# Patient Record
Sex: Female | Born: 1950 | Race: White | Hispanic: No | Smoking: Never smoker
Health system: Southern US, Community
[De-identification: ages and names within clinical notes are randomized; demographics above are authoritative.]

## PROBLEM LIST (undated history)

## (undated) DIAGNOSIS — T8859XA Other complications of anesthesia, initial encounter: Secondary | ICD-10-CM

## (undated) DIAGNOSIS — I25111 Atherosclerotic heart disease of native coronary artery with angina pectoris with documented spasm: Secondary | ICD-10-CM

## (undated) DIAGNOSIS — F419 Anxiety disorder, unspecified: Secondary | ICD-10-CM

## (undated) DIAGNOSIS — Z9861 Coronary angioplasty status: Secondary | ICD-10-CM

## (undated) DIAGNOSIS — E039 Hypothyroidism, unspecified: Secondary | ICD-10-CM

## (undated) DIAGNOSIS — I209 Angina pectoris, unspecified: Secondary | ICD-10-CM

## (undated) DIAGNOSIS — Z8669 Personal history of other diseases of the nervous system and sense organs: Secondary | ICD-10-CM

## (undated) DIAGNOSIS — M8580 Other specified disorders of bone density and structure, unspecified site: Secondary | ICD-10-CM

## (undated) DIAGNOSIS — I2510 Atherosclerotic heart disease of native coronary artery without angina pectoris: Secondary | ICD-10-CM

## (undated) DIAGNOSIS — R51 Headache: Secondary | ICD-10-CM

## (undated) DIAGNOSIS — I10 Essential (primary) hypertension: Secondary | ICD-10-CM

## (undated) DIAGNOSIS — E785 Hyperlipidemia, unspecified: Secondary | ICD-10-CM

## (undated) DIAGNOSIS — K219 Gastro-esophageal reflux disease without esophagitis: Secondary | ICD-10-CM

## (undated) DIAGNOSIS — R519 Headache, unspecified: Secondary | ICD-10-CM

## (undated) DIAGNOSIS — M1990 Unspecified osteoarthritis, unspecified site: Secondary | ICD-10-CM

## (undated) DIAGNOSIS — E119 Type 2 diabetes mellitus without complications: Secondary | ICD-10-CM

## (undated) DIAGNOSIS — T4145XA Adverse effect of unspecified anesthetic, initial encounter: Secondary | ICD-10-CM

## (undated) DIAGNOSIS — E0590 Thyrotoxicosis, unspecified without thyrotoxic crisis or storm: Secondary | ICD-10-CM

## (undated) HISTORY — DX: Other specified disorders of bone density and structure, unspecified site: M85.80

## (undated) HISTORY — PX: BACK SURGERY: SHX140

## (undated) HISTORY — PX: FRACTURE SURGERY: SHX138

## (undated) HISTORY — PX: DILATION AND CURETTAGE OF UTERUS: SHX78

## (undated) HISTORY — DX: Type 2 diabetes mellitus without complications: E11.9

## (undated) HISTORY — PX: WRIST FRACTURE SURGERY: SHX121

## (undated) HISTORY — PX: TONSILLECTOMY: SUR1361

## (undated) HISTORY — PX: WISDOM TOOTH EXTRACTION: SHX21

## (undated) HISTORY — PX: COLONOSCOPY: SHX174

## (undated) HISTORY — PX: MOUTH SURGERY: SHX715

---

## 1995-05-08 HISTORY — PX: LAPAROSCOPIC CHOLECYSTECTOMY: SUR755

## 1999-02-28 ENCOUNTER — Other Ambulatory Visit: Admission: RE | Admit: 1999-02-28 | Discharge: 1999-02-28 | Payer: Self-pay | Admitting: *Deleted

## 1999-04-07 HISTORY — PX: BREAST CYST ASPIRATION: SHX578

## 1999-06-21 ENCOUNTER — Other Ambulatory Visit: Admission: RE | Admit: 1999-06-21 | Discharge: 1999-06-21 | Payer: Self-pay | Admitting: Radiology

## 2000-02-08 ENCOUNTER — Other Ambulatory Visit: Admission: RE | Admit: 2000-02-08 | Discharge: 2000-02-08 | Payer: Self-pay | Admitting: *Deleted

## 2001-03-06 ENCOUNTER — Other Ambulatory Visit: Admission: RE | Admit: 2001-03-06 | Discharge: 2001-03-06 | Payer: Self-pay | Admitting: *Deleted

## 2001-10-27 ENCOUNTER — Encounter: Payer: Self-pay | Admitting: Family Medicine

## 2001-10-27 ENCOUNTER — Encounter: Admission: RE | Admit: 2001-10-27 | Discharge: 2001-10-27 | Payer: Self-pay | Admitting: Family Medicine

## 2002-03-10 ENCOUNTER — Other Ambulatory Visit: Admission: RE | Admit: 2002-03-10 | Discharge: 2002-03-10 | Payer: Self-pay | Admitting: *Deleted

## 2003-03-12 ENCOUNTER — Other Ambulatory Visit: Admission: RE | Admit: 2003-03-12 | Discharge: 2003-03-12 | Payer: Self-pay | Admitting: *Deleted

## 2004-03-16 ENCOUNTER — Other Ambulatory Visit: Admission: RE | Admit: 2004-03-16 | Discharge: 2004-03-16 | Payer: Self-pay | Admitting: *Deleted

## 2004-09-05 ENCOUNTER — Ambulatory Visit (HOSPITAL_COMMUNITY): Admission: RE | Admit: 2004-09-05 | Discharge: 2004-09-05 | Payer: Self-pay | Admitting: Gastroenterology

## 2005-03-17 ENCOUNTER — Other Ambulatory Visit: Admission: RE | Admit: 2005-03-17 | Discharge: 2005-03-17 | Payer: Self-pay | Admitting: Obstetrics and Gynecology

## 2006-08-03 ENCOUNTER — Other Ambulatory Visit: Admission: RE | Admit: 2006-08-03 | Discharge: 2006-08-03 | Payer: Self-pay | Admitting: Obstetrics and Gynecology

## 2007-07-03 ENCOUNTER — Emergency Department (HOSPITAL_COMMUNITY): Admission: EM | Admit: 2007-07-03 | Discharge: 2007-07-03 | Payer: Self-pay | Admitting: Emergency Medicine

## 2007-11-06 ENCOUNTER — Other Ambulatory Visit: Admission: RE | Admit: 2007-11-06 | Discharge: 2007-11-06 | Payer: Self-pay | Admitting: Obstetrics & Gynecology

## 2008-02-25 ENCOUNTER — Emergency Department (HOSPITAL_COMMUNITY): Admission: EM | Admit: 2008-02-25 | Discharge: 2008-02-25 | Payer: Self-pay | Admitting: Emergency Medicine

## 2009-12-22 ENCOUNTER — Ambulatory Visit (HOSPITAL_COMMUNITY)
Admission: RE | Admit: 2009-12-22 | Discharge: 2009-12-22 | Payer: Self-pay | Source: Home / Self Care | Admitting: Gastroenterology

## 2010-02-26 ENCOUNTER — Encounter: Payer: Self-pay | Admitting: Gastroenterology

## 2010-06-24 NOTE — Op Note (Signed)
NAMEJENIKA, [name]                 ACCOUNT NO.:  [id number]   MEDICAL RECORD NO.:  [id number]          PATIENT TYPE:  AMB   LOCATION:  ENDO                         FACILITY:  [facility]   PHYSICIAN:  Bernette Redbird, M.D.   DATE OF BIRTH:  [birth year]   DATE OF PROCEDURE:  09/05/2004  DATE OF DISCHARGE:                                 OPERATIVE REPORT   PROCEDURE:  Colonoscopy.   INDICATIONS:  Need for colon cancer screening in a 60 year old female, who  also has a history of longstanding intermittent rectal bleeding which has  been attributed to internal hemorrhoids.   FINDINGS:  Minimal diverticulosis.  Moderate internal hemorrhoids.   PROCEDURE:  The nature, purpose, and risks of the procedure had been  discussed with the patient who provided written consent.  Sedation was  fentanyl 100 mcg and Versed 9 mg IV prior to and during the course the  procedure without arrhythmias or desaturation.  The Olympus adjustable  tension pediatric video colonoscope was advanced to the terminal ileum with  just mild external abdominal compression needed to control looping in the  proximal colon.  The terminal ileum had a normal appearance, and pullback  was then performed.  The quality of prep was excellent, and it is felt that  all areas were well seen.   The patient had minimal diverticular change in the sigmoid region.  Pullout  through the anal canal demonstrated moderate internal hemorrhoids which are  the presumed source of rectal bleeding; she also had prolapsed hemorrhoids  on perianal exam at the start of the procedure.   No polyps, cancer, colitis, or vascular malformations were observed, and  retroflexion in the rectum was really unremarkable.   No biopsies were obtained.  The patient tolerated the procedure well, and  there were no apparent complications.   IMPRESSION:  1.  Screening examination in this standard risk individual, without      worrisome findings.  2.  Diverticulosis,  minimal.  3.  Moderate internal hemorrhoids which probably account for the patient's      intermittent rectal bleeding.   PLAN:  Consider sigmoidoscopic evaluation in 5 years for continued  screening.       RB/MEDQ  D:  09/05/2004  T:  09/05/2004  Job:  [id number]   cc:   Caryn Bee L. Little, M.D.  [address]  [place]  [place] [address]  Fax: [phone]

## 2011-02-06 ENCOUNTER — Other Ambulatory Visit: Payer: Self-pay | Admitting: Family Medicine

## 2011-02-06 DIAGNOSIS — M5412 Radiculopathy, cervical region: Secondary | ICD-10-CM

## 2011-02-10 ENCOUNTER — Ambulatory Visit
Admission: RE | Admit: 2011-02-10 | Discharge: 2011-02-10 | Disposition: A | Discharge status: Home / Self Care | Payer: 59 | Source: Ambulatory Visit | Attending: Family Medicine | Admitting: Family Medicine

## 2011-02-10 DIAGNOSIS — M5412 Radiculopathy, cervical region: Secondary | ICD-10-CM

## 2011-03-22 ENCOUNTER — Encounter (HOSPITAL_COMMUNITY): Payer: Self-pay | Admitting: Pharmacy Technician

## 2011-03-27 ENCOUNTER — Encounter (HOSPITAL_COMMUNITY)
Admission: RE | Admit: 2011-03-27 | Discharge: 2011-03-27 | Disposition: A | Discharge status: Home / Self Care | Payer: 59 | Source: Ambulatory Visit | Attending: Neurosurgery | Admitting: Neurosurgery

## 2011-03-27 ENCOUNTER — Encounter (HOSPITAL_COMMUNITY): Payer: Self-pay

## 2011-03-27 HISTORY — DX: Hyperlipidemia, unspecified: E78.5

## 2011-03-27 HISTORY — DX: Adverse effect of unspecified anesthetic, initial encounter: T41.45XA

## 2011-03-27 HISTORY — DX: Anxiety disorder, unspecified: F41.9

## 2011-03-27 HISTORY — DX: Other complications of anesthesia, initial encounter: T88.59XA

## 2011-03-27 HISTORY — DX: Hypothyroidism, unspecified: E03.9

## 2011-03-27 LAB — SURGICAL PCR SCREEN
MRSA, PCR: NEGATIVE
Staphylococcus aureus: NEGATIVE

## 2011-03-27 LAB — DIFFERENTIAL
Basophils Relative: 0 % (ref 0–1)
Eosinophils Absolute: 0.1 10*3/uL (ref 0.0–0.7)
Lymphs Abs: 1.7 10*3/uL (ref 0.7–4.0)
Monocytes Relative: 4 % (ref 3–12)
Neutro Abs: 3.7 10*3/uL (ref 1.7–7.7)
Neutrophils Relative %: 64 % (ref 43–77)

## 2011-03-27 LAB — BASIC METABOLIC PANEL
BUN: 16 mg/dL (ref 6–23)
Chloride: 106 mEq/L (ref 96–112)
GFR calc Af Amer: >90 mL/min (ref >90)
GFR calc non Af Amer: >90 mL/min (ref >90)
Glucose, Bld: 85 mg/dL (ref 70–99)
Potassium: 3.5 mEq/L (ref 3.5–5.1)
Sodium: 141 mEq/L (ref 135–145)

## 2011-03-27 LAB — CBC
Hemoglobin: 13.4 g/dL (ref 12.0–15.0)
Platelets: 167 10*3/uL (ref 150–400)
RBC: 4.35 MIL/uL (ref 3.87–5.11)
WBC: 5.8 10*3/uL (ref 4.0–10.5)

## 2011-03-27 LAB — TYPE AND SCREEN: Antibody Screen: NEGATIVE

## 2011-03-27 LAB — ABO/RH: ABO/RH(D): O POS

## 2011-03-27 NOTE — Progress Notes (Signed)
Dr Caryn Bee little called for recent ekg

## 2011-03-27 NOTE — Pre-Procedure Instructions (Signed)
20 Veronica [name]  03/27/2011   Your procedure is scheduled on:  04/03/10  Report to [facility] at 530 AM.  Call this number if you have problems the morning of surgery: [phone]   Remember:   Do not eat food:After Midnight.  May have clear liquids: up to 4 Hours before arrival.  Clear liquids include soda, tea, black coffee, apple or grape juice, broth.  Take these medicines the morning of surgery with A SIP OF WATER: xanax,synthroid,zoloft,diovan   Do not wear jewelry, make-up or nail polish.  Do not wear lotions, powders, or perfumes. You may wear deodorant.  Do not shave 48 hours prior to surgery.  Do not bring valuables to the hospital.  Contacts, dentures or bridgework may not be worn into surgery.  Leave suitcase in the car. After surgery it may be brought to your room.  For patients admitted to the hospital, checkout time is 11:00 AM the day of discharge.   Patients discharged the day of surgery will not be allowed to drive home.  Name and phone number of your driver: family  Special Instructions: CHG Shower Use Special Wash: 1/2 bottle night before surgery and 1/2 bottle morning of surgery.   Please read over the following fact sheets that you were given: Pain Booklet, Coughing and Deep Breathing, Blood Transfusion Information, MRSA Information and Surgical Site Infection Prevention

## 2011-04-03 MED ORDER — CEFAZOLIN SODIUM-DEXTROSE 2-3 GM-% IV SOLR
2.0000 g | INTRAVENOUS | Status: AC
  Administered 2011-04-04: 2 g via INTRAVENOUS
  Filled 2011-04-03: qty 50

## 2011-04-03 MED ORDER — CHLORHEXIDINE GLUCONATE 4 % EX LIQD
1.0000 "application " | Freq: Once | CUTANEOUS | Status: DC
  Filled 2011-04-03: qty 15

## 2011-04-03 MED ORDER — DEXAMETHASONE SODIUM PHOSPHATE 10 MG/ML IJ SOLN
10.0000 mg | Freq: Once | INTRAMUSCULAR | Status: AC
  Administered 2011-04-04: 10 mg via INTRAVENOUS
  Filled 2011-04-03: qty 1

## 2011-04-04 ENCOUNTER — Encounter (HOSPITAL_COMMUNITY): Payer: Self-pay | Admitting: Certified Registered"

## 2011-04-04 ENCOUNTER — Ambulatory Visit (HOSPITAL_COMMUNITY)
Admission: RE | Admit: 2011-04-04 | Discharge: 2011-04-05 | Disposition: A | Discharge status: Home / Self Care | Payer: 59 | Source: Ambulatory Visit | Attending: Neurosurgery | Admitting: Neurosurgery

## 2011-04-04 ENCOUNTER — Encounter (HOSPITAL_COMMUNITY)
Admission: RE | Disposition: A | Discharge status: Home / Self Care | Payer: Self-pay | Source: Ambulatory Visit | Attending: Neurosurgery

## 2011-04-04 ENCOUNTER — Ambulatory Visit (HOSPITAL_COMMUNITY): Payer: 59 | Admitting: Certified Registered"

## 2011-04-04 ENCOUNTER — Encounter (HOSPITAL_COMMUNITY): Payer: Self-pay | Admitting: Neurosurgery

## 2011-04-04 ENCOUNTER — Ambulatory Visit (HOSPITAL_COMMUNITY): Payer: 59

## 2011-04-04 ENCOUNTER — Encounter (HOSPITAL_COMMUNITY): Payer: Self-pay | Admitting: *Deleted

## 2011-04-04 DIAGNOSIS — I10 Essential (primary) hypertension: Secondary | ICD-10-CM | POA: Insufficient documentation

## 2011-04-04 DIAGNOSIS — E785 Hyperlipidemia, unspecified: Secondary | ICD-10-CM | POA: Insufficient documentation

## 2011-04-04 DIAGNOSIS — M5412 Radiculopathy, cervical region: Secondary | ICD-10-CM | POA: Diagnosis present

## 2011-04-04 DIAGNOSIS — E039 Hypothyroidism, unspecified: Secondary | ICD-10-CM | POA: Insufficient documentation

## 2011-04-04 DIAGNOSIS — F411 Generalized anxiety disorder: Secondary | ICD-10-CM | POA: Insufficient documentation

## 2011-04-04 DIAGNOSIS — Z01812 Encounter for preprocedural laboratory examination: Secondary | ICD-10-CM | POA: Insufficient documentation

## 2011-04-04 DIAGNOSIS — Z01818 Encounter for other preprocedural examination: Secondary | ICD-10-CM | POA: Insufficient documentation

## 2011-04-04 DIAGNOSIS — M47812 Spondylosis without myelopathy or radiculopathy, cervical region: Secondary | ICD-10-CM | POA: Insufficient documentation

## 2011-04-04 DIAGNOSIS — M5020 Other cervical disc displacement, unspecified cervical region: Secondary | ICD-10-CM | POA: Insufficient documentation

## 2011-04-04 HISTORY — PX: POSTERIOR CERVICAL FUSION/FORAMINOTOMY: SHX5038

## 2011-04-04 SURGERY — POSTERIOR CERVICAL FUSION/FORAMINOTOMY LEVEL 1
Anesthesia: General | Site: Neck | Laterality: Left | Wound class: Clean

## 2011-04-04 SURGERY — Surgical Case
Anesthesia: *Unknown

## 2011-04-04 MED ORDER — SODIUM CHLORIDE 0.9 % IV SOLN
250.0000 mL | INTRAVENOUS | Status: DC
  Administered 2011-04-04: 250 mL via INTRAVENOUS

## 2011-04-04 MED ORDER — POLYETHYLENE GLYCOL 3350 17 G PO PACK
17.0000 g | PACK | Freq: Every day | ORAL | Status: DC | PRN
  Filled 2011-04-04: qty 1

## 2011-04-04 MED ORDER — SENNA 8.6 MG PO TABS
1.0000 | ORAL_TABLET | Freq: Two times a day (BID) | ORAL | Status: DC
  Administered 2011-04-04: 8.6 mg via ORAL
  Filled 2011-04-04 (×4): qty 1

## 2011-04-04 MED ORDER — ROCURONIUM BROMIDE 100 MG/10ML IV SOLN
INTRAVENOUS | Status: DC | PRN
  Administered 2011-04-04: 50 mg via INTRAVENOUS

## 2011-04-04 MED ORDER — HYDROCHLOROTHIAZIDE 12.5 MG PO CAPS
12.5000 mg | ORAL_CAPSULE | Freq: Every day | ORAL | Status: DC
  Administered 2011-04-04: 12.5 mg via ORAL
  Filled 2011-04-04 (×2): qty 1

## 2011-04-04 MED ORDER — KETOROLAC TROMETHAMINE 30 MG/ML IJ SOLN: 30.0000 mg | Freq: Four times a day (QID) | INTRAMUSCULAR | Status: DC

## 2011-04-04 MED ORDER — SODIUM CHLORIDE 0.9 % IR SOLN
Status: DC | PRN
  Administered 2011-04-04: 09:00:00

## 2011-04-04 MED ORDER — DEXTROSE 5 % IV SOLN
INTRAVENOUS | Status: DC | PRN
  Administered 2011-04-04: 08:00:00 via INTRAVENOUS

## 2011-04-04 MED ORDER — IRBESARTAN 75 MG PO TABS
75.0000 mg | ORAL_TABLET | Freq: Every day | ORAL | Status: DC
  Filled 2011-04-04 (×2): qty 1

## 2011-04-04 MED ORDER — GLYCOPYRROLATE 0.2 MG/ML IJ SOLN
INTRAMUSCULAR | Status: DC | PRN
  Administered 2011-04-04: .5 mg via INTRAVENOUS

## 2011-04-04 MED ORDER — ACETAMINOPHEN 325 MG PO TABS
650.0000 mg | ORAL_TABLET | ORAL | Status: DC | PRN
  Administered 2011-04-05: 650 mg via ORAL
  Filled 2011-04-04: qty 2

## 2011-04-04 MED ORDER — ONDANSETRON HCL 4 MG/2ML IJ SOLN
INTRAMUSCULAR | Status: DC | PRN
  Administered 2011-04-04: 4 mg via INTRAVENOUS

## 2011-04-04 MED ORDER — OXYCODONE-ACETAMINOPHEN 5-325 MG PO TABS
1.0000 | ORAL_TABLET | ORAL | Status: DC | PRN
  Administered 2011-04-04 (×2): 1 via ORAL
  Filled 2011-04-04: qty 2
  Filled 2011-04-04: qty 1

## 2011-04-04 MED ORDER — SUFENTANIL CITRATE 50 MCG/ML IV SOLN
INTRAVENOUS | Status: DC | PRN
  Administered 2011-04-04: 20 ug via INTRAVENOUS
  Administered 2011-04-04: 10 ug via INTRAVENOUS

## 2011-04-04 MED ORDER — SERTRALINE HCL 25 MG PO TABS
25.0000 mg | ORAL_TABLET | Freq: Every day | ORAL | Status: DC
  Filled 2011-04-04 (×2): qty 1

## 2011-04-04 MED ORDER — LORAZEPAM 2 MG/ML IJ SOLN: 1.0000 mg | Freq: Once | INTRAMUSCULAR | Status: DC | PRN

## 2011-04-04 MED ORDER — SODIUM CHLORIDE 0.9 % IV SOLN
INTRAVENOUS | Status: AC
  Filled 2011-04-04: qty 500

## 2011-04-04 MED ORDER — ZOLPIDEM TARTRATE 10 MG PO TABS: 10.0000 mg | ORAL_TABLET | Freq: Every evening | ORAL | Status: DC | PRN

## 2011-04-04 MED ORDER — BUPIVACAINE HCL (PF) 0.25 % IJ SOLN
INTRAMUSCULAR | Status: DC | PRN
  Administered 2011-04-04: 20 mL

## 2011-04-04 MED ORDER — ADULT MULTIVITAMIN W/MINERALS CH
1.0000 | ORAL_TABLET | Freq: Every day | ORAL | Status: DC
  Filled 2011-04-04 (×2): qty 1

## 2011-04-04 MED ORDER — MIDAZOLAM HCL 5 MG/5ML IJ SOLN
INTRAMUSCULAR | Status: DC | PRN
  Administered 2011-04-04: 2 mg via INTRAVENOUS

## 2011-04-04 MED ORDER — THROMBIN 5000 UNITS EX SOLR
CUTANEOUS | Status: DC | PRN
  Administered 2011-04-04 (×2): 5000 [IU] via TOPICAL

## 2011-04-04 MED ORDER — NEOSTIGMINE METHYLSULFATE 1 MG/ML IJ SOLN
INTRAMUSCULAR | Status: DC | PRN
  Administered 2011-04-04: 3 mg via INTRAVENOUS

## 2011-04-04 MED ORDER — PROPOFOL 10 MG/ML IV EMUL
INTRAVENOUS | Status: DC | PRN
  Administered 2011-04-04: 160 mg via INTRAVENOUS
  Administered 2011-04-04: 40 mg via INTRAVENOUS

## 2011-04-04 MED ORDER — BISACODYL 10 MG RE SUPP: 10.0000 mg | Freq: Every day | RECTAL | Status: DC | PRN

## 2011-04-04 MED ORDER — HYDROMORPHONE HCL PF 1 MG/ML IJ SOLN
0.2500 mg | INTRAMUSCULAR | Status: DC | PRN
  Administered 2011-04-04 (×2): 0.25 mg via INTRAVENOUS

## 2011-04-04 MED ORDER — KETOROLAC TROMETHAMINE 30 MG/ML IJ SOLN
INTRAMUSCULAR | Status: DC | PRN
  Administered 2011-04-04: 30 mg via INTRAVENOUS

## 2011-04-04 MED ORDER — HYDROCODONE-ACETAMINOPHEN 5-325 MG PO TABS: 1.0000 | ORAL_TABLET | ORAL | Status: DC | PRN

## 2011-04-04 MED ORDER — CEFAZOLIN SODIUM 1-5 GM-% IV SOLN
1.0000 g | Freq: Three times a day (TID) | INTRAVENOUS | Status: AC
  Administered 2011-04-04 (×2): 1 g via INTRAVENOUS
  Filled 2011-04-04 (×2): qty 50

## 2011-04-04 MED ORDER — ATORVASTATIN CALCIUM 40 MG PO TABS
40.0000 mg | ORAL_TABLET | Freq: Every day | ORAL | Status: DC
  Filled 2011-04-04 (×2): qty 1

## 2011-04-04 MED ORDER — CYCLOBENZAPRINE HCL 10 MG PO TABS
10.0000 mg | ORAL_TABLET | Freq: Three times a day (TID) | ORAL | Status: DC | PRN
  Administered 2011-04-04 – 2011-04-05 (×2): 10 mg via ORAL
  Filled 2011-04-04 (×3): qty 1

## 2011-04-04 MED ORDER — LACTATED RINGERS IV SOLN
INTRAVENOUS | Status: DC | PRN
  Administered 2011-04-04 (×2): via INTRAVENOUS

## 2011-04-04 MED ORDER — FLEET ENEMA 7-19 GM/118ML RE ENEM: 1.0000 | ENEMA | Freq: Once | RECTAL | Status: AC | PRN

## 2011-04-04 MED ORDER — PROMETHAZINE HCL 25 MG/ML IJ SOLN: 6.2500 mg | INTRAMUSCULAR | Status: DC | PRN

## 2011-04-04 MED ORDER — HYDROMORPHONE HCL PF 1 MG/ML IJ SOLN: 0.5000 mg | INTRAMUSCULAR | Status: DC | PRN

## 2011-04-04 MED ORDER — ALPRAZOLAM 0.5 MG PO TABS: 0.5000 mg | ORAL_TABLET | Freq: Every evening | ORAL | Status: DC | PRN

## 2011-04-04 MED ORDER — LEVOTHYROXINE SODIUM 125 MCG PO TABS
125.0000 ug | ORAL_TABLET | Freq: Every day | ORAL | Status: DC
  Filled 2011-04-04 (×2): qty 1

## 2011-04-04 MED ORDER — SODIUM CHLORIDE 0.9 % IJ SOLN: 3.0000 mL | INTRAMUSCULAR | Status: DC | PRN

## 2011-04-04 MED ORDER — ASPIRIN EC 81 MG PO TBEC
81.0000 mg | DELAYED_RELEASE_TABLET | Freq: Every day | ORAL | Status: DC
  Administered 2011-04-04: 81 mg via ORAL
  Filled 2011-04-04 (×2): qty 1

## 2011-04-04 MED ORDER — SODIUM CHLORIDE 0.9 % IJ SOLN
3.0000 mL | Freq: Two times a day (BID) | INTRAMUSCULAR | Status: DC
  Administered 2011-04-04: 3 mL via INTRAVENOUS

## 2011-04-04 MED ORDER — ONDANSETRON HCL 4 MG/2ML IJ SOLN
4.0000 mg | INTRAMUSCULAR | Status: DC | PRN
  Administered 2011-04-04 – 2011-04-05 (×2): 4 mg via INTRAVENOUS
  Filled 2011-04-04 (×2): qty 2

## 2011-04-04 MED ORDER — HEMOSTATIC AGENTS (NO CHARGE) OPTIME
TOPICAL | Status: DC | PRN
  Administered 2011-04-04: 1 via TOPICAL

## 2011-04-04 MED ORDER — 0.9 % SODIUM CHLORIDE (POUR BTL) OPTIME
TOPICAL | Status: DC | PRN
  Administered 2011-04-04: 1000 mL

## 2011-04-04 MED ORDER — BACITRACIN 50000 UNITS IM SOLR
INTRAMUSCULAR | Status: AC
  Filled 2011-04-04: qty 1

## 2011-04-04 MED ORDER — SODIUM CHLORIDE 0.9 % IV SOLN
10.0000 mg | INTRAVENOUS | Status: DC | PRN
  Administered 2011-04-04: 80 ug via INTRAVENOUS

## 2011-04-04 MED ORDER — KETOROLAC TROMETHAMINE 30 MG/ML IJ SOLN
30.0000 mg | Freq: Four times a day (QID) | INTRAMUSCULAR | Status: DC
  Administered 2011-04-04: 30 mg via INTRAVENOUS
  Filled 2011-04-04 (×2): qty 1

## 2011-04-04 MED ORDER — FENTANYL CITRATE 0.05 MG/ML IJ SOLN: 50.0000 ug | INTRAMUSCULAR | Status: DC | PRN

## 2011-04-04 MED ORDER — MIDAZOLAM HCL 2 MG/2ML IJ SOLN: 1.0000 mg | INTRAMUSCULAR | Status: DC | PRN

## 2011-04-04 MED ORDER — ALUM & MAG HYDROXIDE-SIMETH 200-200-20 MG/5ML PO SUSP: 30.0000 mL | Freq: Four times a day (QID) | ORAL | Status: DC | PRN

## 2011-04-04 MED ORDER — HYDROMORPHONE HCL PF 1 MG/ML IJ SOLN
INTRAMUSCULAR | Status: AC
  Filled 2011-04-04: qty 1

## 2011-04-04 MED ORDER — ACETAMINOPHEN 650 MG RE SUPP: 650.0000 mg | RECTAL | Status: DC | PRN

## 2011-04-04 MED ORDER — BACITRACIN ZINC 500 UNIT/GM EX OINT
TOPICAL_OINTMENT | CUTANEOUS | Status: DC | PRN
  Administered 2011-04-04: 1 via TOPICAL

## 2011-04-04 MED ORDER — PHENOL 1.4 % MT LIQD: 1.0000 | OROMUCOSAL | Status: DC | PRN

## 2011-04-04 MED ORDER — KETOROLAC TROMETHAMINE 30 MG/ML IJ SOLN
30.0000 mg | Freq: Four times a day (QID) | INTRAMUSCULAR | Status: DC
  Administered 2011-04-04 – 2011-04-05 (×3): 30 mg via INTRAVENOUS
  Filled 2011-04-04 (×6): qty 1

## 2011-04-04 MED ORDER — MENTHOL 3 MG MT LOZG
1.0000 | LOZENGE | OROMUCOSAL | Status: DC | PRN
  Filled 2011-04-04: qty 9

## 2011-04-04 SURGICAL SUPPLY — 58 items
ADH SKN CLS APL DERMABOND .7 (GAUZE/BANDAGES/DRESSINGS) ×1
ADH SKN CLS LQ APL DERMABOND (GAUZE/BANDAGES/DRESSINGS) ×1
APL SKNCLS STERI-STRIP NONHPOA (GAUZE/BANDAGES/DRESSINGS) ×1
BAG DECANTER FOR FLEXI CONT (MISCELLANEOUS) ×2 IMPLANT
BENZOIN TINCTURE PRP APPL 2/3 (GAUZE/BANDAGES/DRESSINGS) ×2 IMPLANT
BLADE SURG ROTATE 9660 (MISCELLANEOUS) IMPLANT
BRUSH SCRUB EZ PLAIN DRY (MISCELLANEOUS) ×2 IMPLANT
BUR MATCHSTICK NEURO 3.0 LAGG (BURR) ×2 IMPLANT
CANISTER SUCTION 2500CC (MISCELLANEOUS) ×2 IMPLANT
CLOSURE STERI STRIP 1/2 X4 (GAUZE/BANDAGES/DRESSINGS) ×1 IMPLANT
CLOTH BEACON ORANGE TIMEOUT ST (SAFETY) ×2 IMPLANT
CONT SPEC 4OZ CLIKSEAL STRL BL (MISCELLANEOUS) ×2 IMPLANT
DERMABOND ADHESIVE PROPEN (GAUZE/BANDAGES/DRESSINGS) ×1
DERMABOND ADVANCED (GAUZE/BANDAGES/DRESSINGS) ×1
DERMABOND ADVANCED .7 DNX12 (GAUZE/BANDAGES/DRESSINGS) ×1 IMPLANT
DERMABOND ADVANCED .7 DNX6 (GAUZE/BANDAGES/DRESSINGS) IMPLANT
DRAPE C-ARM 42X72 X-RAY (DRAPES) ×4 IMPLANT
DRAPE LAPAROTOMY 100X72 PEDS (DRAPES) ×2 IMPLANT
DRAPE POUCH INSTRU U-SHP 10X18 (DRAPES) ×2 IMPLANT
ELECT REM PT RETURN 9FT ADLT (ELECTROSURGICAL) ×2
ELECTRODE REM PT RTRN 9FT ADLT (ELECTROSURGICAL) ×1 IMPLANT
EVACUATOR 1/8 PVC DRAIN (DRAIN) IMPLANT
GAUZE SPONGE 4X4 12PLY STRL LF (GAUZE/BANDAGES/DRESSINGS) ×1 IMPLANT
GAUZE SPONGE 4X4 16PLY XRAY LF (GAUZE/BANDAGES/DRESSINGS) IMPLANT
GLOVE BIOGEL PI IND STRL 6.5 (GLOVE) IMPLANT
GLOVE BIOGEL PI INDICATOR 6.5 (GLOVE) ×1
GLOVE ECLIPSE 7.5 STRL STRAW (GLOVE) ×1 IMPLANT
GLOVE ECLIPSE 8.5 STRL (GLOVE) ×2 IMPLANT
GLOVE EXAM NITRILE LRG STRL (GLOVE) IMPLANT
GLOVE EXAM NITRILE MD LF STRL (GLOVE) ×1 IMPLANT
GLOVE EXAM NITRILE XL STR (GLOVE) IMPLANT
GLOVE EXAM NITRILE XS STR PU (GLOVE) IMPLANT
GLOVE OPTIFIT SS 6.5 STRL BRWN (GLOVE) ×2 IMPLANT
GOWN BRE IMP SLV AUR LG STRL (GOWN DISPOSABLE) ×1 IMPLANT
GOWN BRE IMP SLV AUR XL STRL (GOWN DISPOSABLE) ×3 IMPLANT
GOWN STRL REIN 2XL LVL4 (GOWN DISPOSABLE) IMPLANT
KIT BASIN OR (CUSTOM PROCEDURE TRAY) ×2 IMPLANT
KIT ROOM TURNOVER OR (KITS) ×2 IMPLANT
NDL SPNL 22GX3.5 QUINCKE BK (NEEDLE) ×1 IMPLANT
NEEDLE HYPO 22GX1.5 SAFETY (NEEDLE) ×2 IMPLANT
NEEDLE SPNL 22GX3.5 QUINCKE BK (NEEDLE) ×2 IMPLANT
NS IRRIG 1000ML POUR BTL (IV SOLUTION) ×2 IMPLANT
PACK LAMINECTOMY NEURO (CUSTOM PROCEDURE TRAY) ×2 IMPLANT
PAD ARMBOARD 7.5X6 YLW CONV (MISCELLANEOUS) ×6 IMPLANT
PIN MAYFIELD SKULL DISP (PIN) ×2 IMPLANT
SPONGE GAUZE 4X4 12PLY (GAUZE/BANDAGES/DRESSINGS) ×2 IMPLANT
SPONGE LAP 4X18 X RAY DECT (DISPOSABLE) IMPLANT
SPONGE SURGIFOAM ABS GEL SZ50 (HEMOSTASIS) ×2 IMPLANT
STRIP CLOSURE SKIN 1/2X4 (GAUZE/BANDAGES/DRESSINGS) ×2 IMPLANT
SUT VIC AB 0 CT1 18XCR BRD8 (SUTURE) ×1 IMPLANT
SUT VIC AB 0 CT1 8-18 (SUTURE) ×2
SUT VIC AB 2-0 CT1 18 (SUTURE) ×2 IMPLANT
SUT VIC AB 3-0 SH 8-18 (SUTURE) ×2 IMPLANT
SYR 20ML ECCENTRIC (SYRINGE) ×2 IMPLANT
TAPE CLOTH SURG 4X10 WHT LF (GAUZE/BANDAGES/DRESSINGS) ×1 IMPLANT
TOWEL OR 17X24 6PK STRL BLUE (TOWEL DISPOSABLE) ×2 IMPLANT
TOWEL OR 17X26 10 PK STRL BLUE (TOWEL DISPOSABLE) ×2 IMPLANT
WATER STERILE IRR 1000ML POUR (IV SOLUTION) ×2 IMPLANT

## 2011-04-04 NOTE — Brief Op Note (Signed)
04/04/2011  9:29 AM  PATIENT:  Veronica [name]  61 y.o. female  PRE-OPERATIVE DIAGNOSIS:  Herniated Nucleus Pulposus Cervical Six-Seven  POST-OPERATIVE DIAGNOSIS:  Herniated Nucleus Pulposus  Cervical Six-Seven  PROCEDURE:  Procedure(s) (LRB): POSTERIOR CERVICAL FUSION/FORAMINOTOMY LEVEL 1 (Left)  SURGEON:  Surgeon(s) and Role:    * Temple Pacini, MD - Primary    * Reinaldo Meeker, MD - Assisting  PHYSICIAN ASSISTANT:   ASSISTANTS: Kritzer   ANESTHESIA:   general  EBL:  Total I/O In: 50 [I.V.:50] Out: -   BLOOD ADMINISTERED:none  DRAINS: none   LOCAL MEDICATIONS USED:  MARCAINE     SPECIMEN:  No Specimen  DISPOSITION OF SPECIMEN:  N/A  COUNTS:  YES  TOURNIQUET:  * No tourniquets in log *  DICTATION: .Dragon Dictation  PLAN OF CARE: Admit for overnight observation  PATIENT DISPOSITION:  PACU - hemodynamically stable.   Delay start of Pharmacological VTE agent (>24hrs) due to surgical blood loss or risk of bleeding: yes

## 2011-04-04 NOTE — H&P (Signed)
Veronica [name] is an 61 y.o. female.   Chief Complaint: Left upper extremity pain and weakness. HPI: 61 year old female with left-sided neck pain with radiation into her left upper extremity. Patient with marked triceps weakness and loss of sensation in her second and third digit on the left hand. She has failed conservative management including anti-inflammatory medications and physical therapy. She has minimal if any right-sided symptoms. She has no bowel or bladder dysfunction. MRI scanning demonstrates advanced multilevel spondylitic change with marked spondylosis off to the right side at C3-4 C4-5 C5-6 and C6-7. The patient has a spondylitic protrusion with associated disc herniation off to the left side at C6-7. We discussed options for management. The patient wishes to proceed with a laminotomy foraminotomy and microdiscectomy at C6-7 on the left in hopes of improving her symptoms in hopes of avoiding a multilevel cervical decompression and fusion.  Past Medical History  Diagnosis Date  . Complication of anesthesia     extreme sore throat after aneathesia,   also      "woke up screamimg" after surgery  . Hypothyroidism   . Hypertension     stress test 10 yrs ago.   pcp   dr Caryn Bee little  . Anxiety   . Hyperlipemia     Past Surgical History  Procedure Date  . Cholecystectomy   . Wrist fracture surgery   . Dilation and curettage of uterus   . Tonsillectomy     History reviewed. No pertinent family history. Social History:  reports that she has never smoked. She does not have any smokeless tobacco history on file. She reports that she does not drink alcohol or use illicit drugs.  Allergies:  Allergies  Allergen Reactions  . Prilosec (Omeprazole Magnesium) Other (See Comments)    Blisters and skin peels off  . Sulfa Antibiotics Other (See Comments)    Blisters and skin peels off  . Ultram (Tramadol Hcl)     Lapse of memory      Medications Prior to Admission  Medication Dose  Route Frequency Provider Last Rate Last Dose  . bacitracin 16109 UNITS injection           . ceFAZolin (ANCEF) IVPB 2 g/50 mL premix  2 g Intravenous 60 min Pre-Op Temple Pacini, MD      . chlorhexidine (HIBICLENS) 4 % liquid 1 application  1 application Topical Once Temple Pacini, MD      . dexamethasone (DECADRON) injection 10 mg  10 mg Intravenous Once Temple Pacini, MD      . fentaNYL (SUBLIMAZE) injection 50-100 mcg  50-100 mcg Intravenous PRN Bedelia Person, MD      . midazolam (VERSED) injection 1-2 mg  1-2 mg Intravenous PRN Bedelia Person, MD      . sodium chloride 0.9 % infusion            Medications Prior to Admission  Medication Sig Dispense Refill  . ALPRAZolam (XANAX) 0.5 MG tablet Take 0.5 mg by mouth at bedtime as needed. For sleep      . aspirin EC 81 MG tablet Take 81 mg by mouth daily.      [name] atorvastatin (LIPITOR) 40 MG tablet Take 40 mg by mouth daily.      . fish oil-omega-3 fatty acids 1000 MG capsule Take 1 g by mouth daily.      . hydrochlorothiazide (MICROZIDE) 12.5 MG capsule Take 12.5 mg by mouth daily.      [name] levothyroxine (SYNTHROID,  LEVOTHROID) 125 MCG tablet Take 125 mcg by mouth daily.      . Multiple Vitamin (MULITIVITAMIN WITH MINERALS) TABS Take 1 tablet by mouth daily.      . sertraline (ZOLOFT) 50 MG tablet Take 25 mg by mouth daily.      . valsartan (DIOVAN) 40 MG tablet Take 40 mg by mouth daily.        No results found for this or any previous visit (from the past 48 hour(s)). No results found.  Review of Systems  Constitutional: Negative.   HENT: Negative.   Eyes: Negative.   Respiratory: Negative.   Cardiovascular: Negative.   Gastrointestinal: Negative.   Genitourinary: Negative.   Musculoskeletal: Negative.   Skin: Negative.   Neurological: Negative.   Endo/Heme/Allergies: Negative.   Psychiatric/Behavioral: Negative.     Blood pressure 120/76, pulse 67, temperature 98.2 F (36.8 C), temperature source Oral, resp. rate 20, SpO2  95.00%. Physical Exam  Constitutional: She is oriented to person, place, and time. She appears well-developed and well-nourished. No distress.  HENT:  Head: Normocephalic and atraumatic.  Right Ear: External ear normal.  Left Ear: External ear normal.  Mouth/Throat: Oropharynx is clear and moist.  Eyes: Conjunctivae and EOM are normal. Pupils are equal, round, and reactive to light.  Neck: Normal range of motion. Neck supple. No tracheal deviation present. No thyromegaly present.  Cardiovascular: Normal rate, regular rhythm, normal heart sounds and intact distal pulses.  Exam reveals no friction rub.   No murmur heard. Respiratory: Effort normal and breath sounds normal.  GI: Soft. Bowel sounds are normal. She exhibits no distension. There is no tenderness.  Musculoskeletal: Normal range of motion. She exhibits no edema and no tenderness.  Neurological: She is alert and oriented to person, place, and time. She has normal reflexes. She displays normal reflexes. No cranial nerve deficit. She exhibits normal muscle tone. Coordination normal.  Skin: Skin is warm and dry. No rash noted. She is not diaphoretic. No erythema. No pallor.  Psychiatric: She has a normal mood and affect. Her behavior is normal. Judgment and thought content normal.     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 Left C6-7 laminotomy foraminotomy and microdiscectomy. Risks and benefits of explained. Patient wishes to proceed.  Veronica [name] A 04/04/2011, 7:45 AM

## 2011-04-04 NOTE — Anesthesia Postprocedure Evaluation (Signed)
  Anesthesia Post-op Note  Patient: Veronica [name]  Procedure(s) Performed: Procedure(s) (LRB): POSTERIOR CERVICAL FUSION/FORAMINOTOMY LEVEL 1 (Left)  Patient Location: PACU  Anesthesia Type: General  Level of Consciousness: awake and alert   Airway and Oxygen Therapy: Patient Spontanous Breathing  Post-op Pain: mild  Post-op Assessment: Post-op Vital signs reviewed, Patient's Cardiovascular Status Stable, Respiratory Function Stable, Patent Airway, No signs of Nausea or vomiting and Pain level controlled  Post-op Vital Signs: stable  Complications: No apparent anesthesia complications

## 2011-04-04 NOTE — Op Note (Signed)
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  Postoperative diagnosis: Same  Procedure Name: Left C6-7 laminotomy, foraminotomy, microdiscectomy.  Surgeon:Rockie Schnoor A.Bethany Cumming, M.D.  Asst. Surgeon: Gerlene Fee  Anesthesia: General  Indication: 61 year old female with a left extremity pain paresthesias and weakness consistent with a left C7 radiculopathy. Workup with spondylosis and a foraminal disc herniation on the left side at C6-7. Patient presents now for laminotomy and foraminotomy with microdiscectomy in hopes of improving her symptoms.  Operative note: After induction of anesthesia, patient positioned prone onto bolsters with her head fixed in a [place] pin headrest and her neck positioned in a mildly flexed position. Patient's posterior cervical region was prepped and draped sterilely. 10 blade used to make a linear skin incision overlying the C6-7 level. Supper off Sexton performed in the left side exposing the lamina and facet joints of C6-7. Self retaining retractor was placed x-ray was taken level was confirmed. Laminotomy and foraminotomies performed using a high-speed drill and Kerrison rongeurs to be the inferior aspect of lamina of C6 medial aspect of the C6-7 facet joint and the superior rim of the C7 lamina. Ligament flavum was elevated and resected in piecemeal fashion using Kerrison rongeurs. Underlying thecal sac and left C7 nerve root were then applied. Wide decompressive foraminotomy was then performed along course the exiting C7 nerve root. Microscope was brought into the field and used for microdissection of the epidural space. Epidermides plexus was coagulated and cut. C7 nerve root was gently mobilized cephalad. A focal disc herniation was encountered and dissected free. This is removed using micropituitary's. The disc space otherwise was spondylitic but not  particularly protruding. No further disc herniation was encountered. After ensuring adequate decompression by means of foraminotomies the wound is then irrigated out solution. Gelfoam was placed topically for hemostasis which Ascent be good. Microscope retractor system were removed. Hemostasis of the muscle was achieved using the electrocautery. Wounds and close in layers with Vicryl sutures. Steri-Strips triggers were applied. There were no apparent complications. Patient tolerated procedure well and returned to the recovery room postop.

## 2011-04-04 NOTE — Preoperative (Signed)
Beta Blockers   Reason not to administer Beta Blockers:Not Applicable 

## 2011-04-04 NOTE — Anesthesia Preprocedure Evaluation (Addendum)
Anesthesia Evaluation  Patient identified by MRN, date of birth, ID band Patient awake    Reviewed: Allergy & Precautions, H&P , NPO status , Patient's Chart, lab work & pertinent test results  Airway Mallampati: II TM Distance: <3 FB Neck ROM: Limited  Mouth opening: Limited Mouth Opening  Dental  (+) Teeth Intact   Pulmonary    Pulmonary exam normal       Cardiovascular hypertension,     Neuro/Psych Anxiety    GI/Hepatic   Endo/Other  Hypothyroidism   Renal/GU      Musculoskeletal   Abdominal (+) obese,   Peds  Hematology   Anesthesia Other Findings   Reproductive/Obstetrics                          Anesthesia Physical Anesthesia Plan  ASA: II  Anesthesia Plan: General   Post-op Pain Management:    Induction: Intravenous  Airway Management Planned: Oral ETT  Additional Equipment:   Intra-op Plan:   Post-operative Plan: Extubation in OR  Informed Consent: I have reviewed the patients History and Physical, chart, labs and discussed the procedure including the risks, benefits and alternatives for the proposed anesthesia with the patient or authorized representative who has indicated his/her understanding and acceptance.     Plan Discussed with: CRNA and Surgeon  Anesthesia Plan Comments:         Anesthesia Quick Evaluation

## 2011-04-04 NOTE — Transfer of Care (Signed)
Immediate Anesthesia Transfer of Care Note  Patient: Veronica [name]  Procedure(s) Performed: Procedure(s) (LRB): POSTERIOR CERVICAL FUSION/FORAMINOTOMY LEVEL 1 (Left)  Patient Location: PACU  Anesthesia Type: General  Level of Consciousness: awake, alert  and oriented  Airway & Oxygen Therapy: Patient Spontanous Breathing and Patient connected to nasal cannula oxygen  Post-op Assessment: Report given to PACU RN, Post -op Vital signs reviewed and stable and Patient moving all extremities  Post vital signs: Reviewed  Complications: No apparent anesthesia complications

## 2011-04-05 MED ORDER — HYDROCODONE-ACETAMINOPHEN 5-325 MG PO TABS: 1.0000 | ORAL_TABLET | ORAL | Status: AC | PRN

## 2011-04-05 MED ORDER — KETOROLAC TROMETHAMINE 10 MG PO TABS: 10.0000 mg | ORAL_TABLET | Freq: Four times a day (QID) | ORAL | Status: AC | PRN

## 2011-04-05 MED ORDER — CYCLOBENZAPRINE HCL 10 MG PO TABS: 10.0000 mg | ORAL_TABLET | Freq: Three times a day (TID) | ORAL | Status: AC | PRN

## 2011-04-05 NOTE — Discharge Summary (Signed)
Physician Discharge Summary  Patient ID: Veronica [name] MRN: [id number] DOB/AGE: 1951-01-06 61 y.o.  Admit date: 04/04/2011 Discharge date: 04/05/2011  Admission Diagnoses:  Discharge Diagnoses:  Principal Problem:  *Cervical radiculopathy   Discharged Condition: good  Hospital Course: Patient admitted where she underwent an uncomplicated left-sided C6-7 laminotomy foraminotomy and microdiscectomy. Postoperative she has done well. Her preoperative radicular pain has resolved. Her strength station are much improved. Wound healing well. No other problems during hospitalization. Ready for discharge. Consults:   Significant Diagnostic Studies:   Treatments:   Discharge Exam: Blood pressure 126/80, pulse 75, temperature 98.4 F (36.9 C), temperature source Oral, resp. rate 19, SpO2 98.00%. Awake and alert oriented and appropriate particular function is intact. Motor sensory function extremities is normal. Wound clean dry and intact. Chest and abdomen benign.  Disposition: Final discharge disposition not confirmed   Medication List  As of 04/05/2011  7:49 AM   TAKE these medications         ALPRAZolam 0.5 MG tablet   Commonly known as: XANAX   Take 0.5 mg by mouth at bedtime as needed. For sleep      aspirin EC 81 MG tablet   Take 81 mg by mouth daily.      atorvastatin 40 MG tablet   Commonly known as: LIPITOR   Take 40 mg by mouth daily.      cyclobenzaprine 10 MG tablet   Commonly known as: FLEXERIL   Take 1 tablet (10 mg total) by mouth 3 (three) times daily as needed for muscle spasms.      fish oil-omega-3 fatty acids 1000 MG capsule   Take 1 g by mouth daily.      hydrochlorothiazide 12.5 MG capsule   Commonly known as: MICROZIDE   Take 12.5 mg by mouth daily.      HYDROcodone-acetaminophen 5-325 MG per tablet   Commonly known as: NORCO   Take 1-2 tablets by mouth every 4 (four) hours as needed.      ketorolac 10 MG tablet   Commonly known as: TORADOL   Take  1 tablet (10 mg total) by mouth every 6 (six) hours as needed for pain.      levothyroxine 125 MCG tablet   Commonly known as: SYNTHROID, LEVOTHROID   Take 125 mcg by mouth daily.      mulitivitamin with minerals Tabs   Take 1 tablet by mouth daily.      sertraline 50 MG tablet   Commonly known as: ZOLOFT   Take 25 mg by mouth daily.      valsartan 40 MG tablet   Commonly known as: DIOVAN   Take 40 mg by mouth daily.           Follow-up Information    Follow up with Kyliah Deanda A, MD. Call in 1 week.   Contact information:   [address] [place] [address] [phone]          Signed: Temple Pacini 04/05/2011, 7:49 AM

## 2011-04-05 NOTE — Progress Notes (Signed)
Patient was d/c today per MD's order , assessments remained unchanged prior to discharged.

## 2011-04-06 ENCOUNTER — Encounter (HOSPITAL_COMMUNITY): Payer: Self-pay | Admitting: Neurosurgery

## 2012-05-23 ENCOUNTER — Ambulatory Visit: Payer: Self-pay | Admitting: Obstetrics & Gynecology

## 2012-05-23 DIAGNOSIS — Z01419 Encounter for gynecological examination (general) (routine) without abnormal findings: Secondary | ICD-10-CM

## 2012-06-10 ENCOUNTER — Encounter: Payer: Self-pay | Admitting: Obstetrics & Gynecology

## 2012-06-10 ENCOUNTER — Ambulatory Visit: Payer: Self-pay | Admitting: Obstetrics & Gynecology

## 2012-06-10 ENCOUNTER — Ambulatory Visit (INDEPENDENT_AMBULATORY_CARE_PROVIDER_SITE_OTHER): Payer: 59 | Admitting: Obstetrics & Gynecology

## 2012-06-10 VITALS — BP 142/78 | Ht 63.75 in | Wt 183.2 lb

## 2012-06-10 DIAGNOSIS — Z01419 Encounter for gynecological examination (general) (routine) without abnormal findings: Secondary | ICD-10-CM

## 2012-06-10 DIAGNOSIS — K649 Unspecified hemorrhoids: Secondary | ICD-10-CM

## 2012-06-10 MED ORDER — HYDROCORTISONE 2.5 % RE CREA: TOPICAL_CREAM | Freq: Two times a day (BID) | RECTAL | Status: DC

## 2012-06-10 NOTE — Progress Notes (Signed)
62 y.o. G2P2 MarriedCaucasianF here for annual exam.  Going to give notice this week for retirement.  Will retire June 1.  She is really excited about the next phase.  No vaginal bleeding.  Just had tetanus update.    Patient's last menstrual period was 02/06/2010.          Sexually active: yes  The current method of family planning is post menopausal status.    Exercising: yes  walking Smoker:  no  Health Maintenance: Pap:  04/19/11 WNL/negative HR HPV MMG:  12/08/11 normal Colonoscopy:  2006 repeat 10 years (initial recommendation was five yrs but changed to 10--Dr. Buccini) BMD:   11/29/10 normal (worst t score -1.5 in spine) TDaP:  4/14 Labs: PCP   reports that she has never smoked. She has never used smokeless tobacco. She reports that she does not drink alcohol or use illicit drugs.  Past Medical History  Diagnosis Date  . Complication of anesthesia     extreme sore throat after aneathesia,   also      "woke up screamimg" after surgery  . Hypothyroidism   . Hypertension     stress test 10 yrs ago.   pcp   dr Caryn Bee little  . Anxiety   . Hyperlipemia   . Breast cyst 3/01    left  . Osteopenia     Past Surgical History  Procedure Laterality Date  . Cholecystectomy    . Wrist fracture surgery    . Dilation and curettage of uterus    . Tonsillectomy    . Posterior cervical fusion/foraminotomy  04/04/2011    Procedure: POSTERIOR CERVICAL FUSION/FORAMINOTOMY LEVEL 1;  Surgeon: Temple Pacini, MD;  Location: [facility];  Service: Neurosurgery;  Laterality: Left;  Left Cervical Six-Seven Laminectomy, Foraminotomy, Diskectomy     Current Outpatient Prescriptions  Medication Sig Dispense Refill  . ALPRAZolam (XANAX) 0.5 MG tablet Take 0.5 mg by mouth at bedtime as needed. For sleep      . aspirin EC 81 MG tablet Take 81 mg by mouth daily.      [name] atorvastatin (LIPITOR) 40 MG tablet Take 40 mg by mouth daily.      . hydrochlorothiazide (MICROZIDE) 12.5 MG capsule Take 12.5 mg by  mouth daily.      [name] levothyroxine (SYNTHROID, LEVOTHROID) 125 MCG tablet Take 125 mcg by mouth daily.      . Multiple Vitamin (MULITIVITAMIN WITH MINERALS) TABS Take 1 tablet by mouth daily.      . sertraline (ZOLOFT) 50 MG tablet Take 25 mg by mouth daily.      . valsartan (DIOVAN) 40 MG tablet Take 40 mg by mouth daily.      . fish oil-omega-3 fatty acids 1000 MG capsule Take 1 g by mouth daily.       No current facility-administered medications for this visit.    History reviewed. No pertinent family history.  ROS:  Pertinent items are noted in HPI.  Otherwise, a comprehensive ROS was negative.  Exam:   BP 142/78  Ht 5' 3.75" (1.619 m)  Wt 183 lb 3.2 oz (83.099 kg)  BMI 31.7 kg/m2  LMP 02/06/2010  Wt +3 lbs   Height: 5' 3.75" (161.9 cm)  Ht Readings from Last 3 Encounters:  06/10/12 5' 3.75" (1.619 m)  04/05/11 5\' 4"  (1.626 m)  04/05/11 5\' 4"  (1.626 m)    General appearance: alert, cooperative and appears stated age Head: Normocephalic, without obvious abnormality, atraumatic Neck: no adenopathy,  supple, symmetrical, trachea midline and thyroid normal to inspection and palpation Lungs: clear to auscultation bilaterally Breasts: normal appearance, no masses or tenderness Heart: regular rate and rhythm Abdomen: soft, non-tender; bowel sounds normal; no masses,  no organomegaly Extremities: extremities normal, atraumatic, no cyanosis or edema Skin: Skin color, texture, turgor normal. No rashes or lesions Lymph nodes: Cervical, supraclavicular, and axillary nodes normal. No abnormal inguinal nodes palpated Neurologic: Grossly normal   Pelvic: External genitalia:  no lesions              Urethra:  normal appearing urethra with no masses, tenderness or lesions              Bartholins and Skenes: normal                 Vagina: normal appearing vagina with normal color and discharge, no lesions              Cervix: no lesions              Pap taken: no Bimanual Exam:  Uterus:   normal size, contour, position, consistency, mobility, non-tender              Adnexa: no mass, fullness, tenderness               Rectovaginal: Confirms               Anus:  normal sphincter tone, no lesions  A:  Well Woman with normal exam, PMP not on HRT Hypertension Hyperlipidemia Vaginal atrophic changes Hypothyroidism Hemorrhoids   P:   Mammogram yearly.  Pap with neg HR HPV 3/13.  No Pap today. Labs yearly with Dr. Clarene Duke. Anusol HC 2.5% BID PRN hemorrhoid flares return annually or prn  An After Visit Summary was printed and given to the patient.

## 2012-06-10 NOTE — Patient Instructions (Signed)

## 2012-11-27 ENCOUNTER — Other Ambulatory Visit: Payer: Self-pay | Admitting: Family Medicine

## 2012-11-27 DIAGNOSIS — R748 Abnormal levels of other serum enzymes: Secondary | ICD-10-CM

## 2012-11-28 ENCOUNTER — Ambulatory Visit
Admission: RE | Admit: 2012-11-28 | Discharge: 2012-11-28 | Disposition: A | Discharge status: Home / Self Care | Payer: 59 | Source: Ambulatory Visit | Attending: Family Medicine | Admitting: Family Medicine

## 2012-11-28 DIAGNOSIS — R748 Abnormal levels of other serum enzymes: Secondary | ICD-10-CM

## 2013-01-15 ENCOUNTER — Encounter: Payer: Self-pay | Admitting: Obstetrics & Gynecology

## 2013-03-30 IMAGING — CR DG CHEST 2V
2 series · 2 of 2 positions shown · non-contrast
Comparison: None.

CLINICAL DATA: Preoperative respiratory exam for cervical
laminectomy.  Hypertension.  Neck and arm pain.

CHEST - 2 VIEW

[w chest pa]
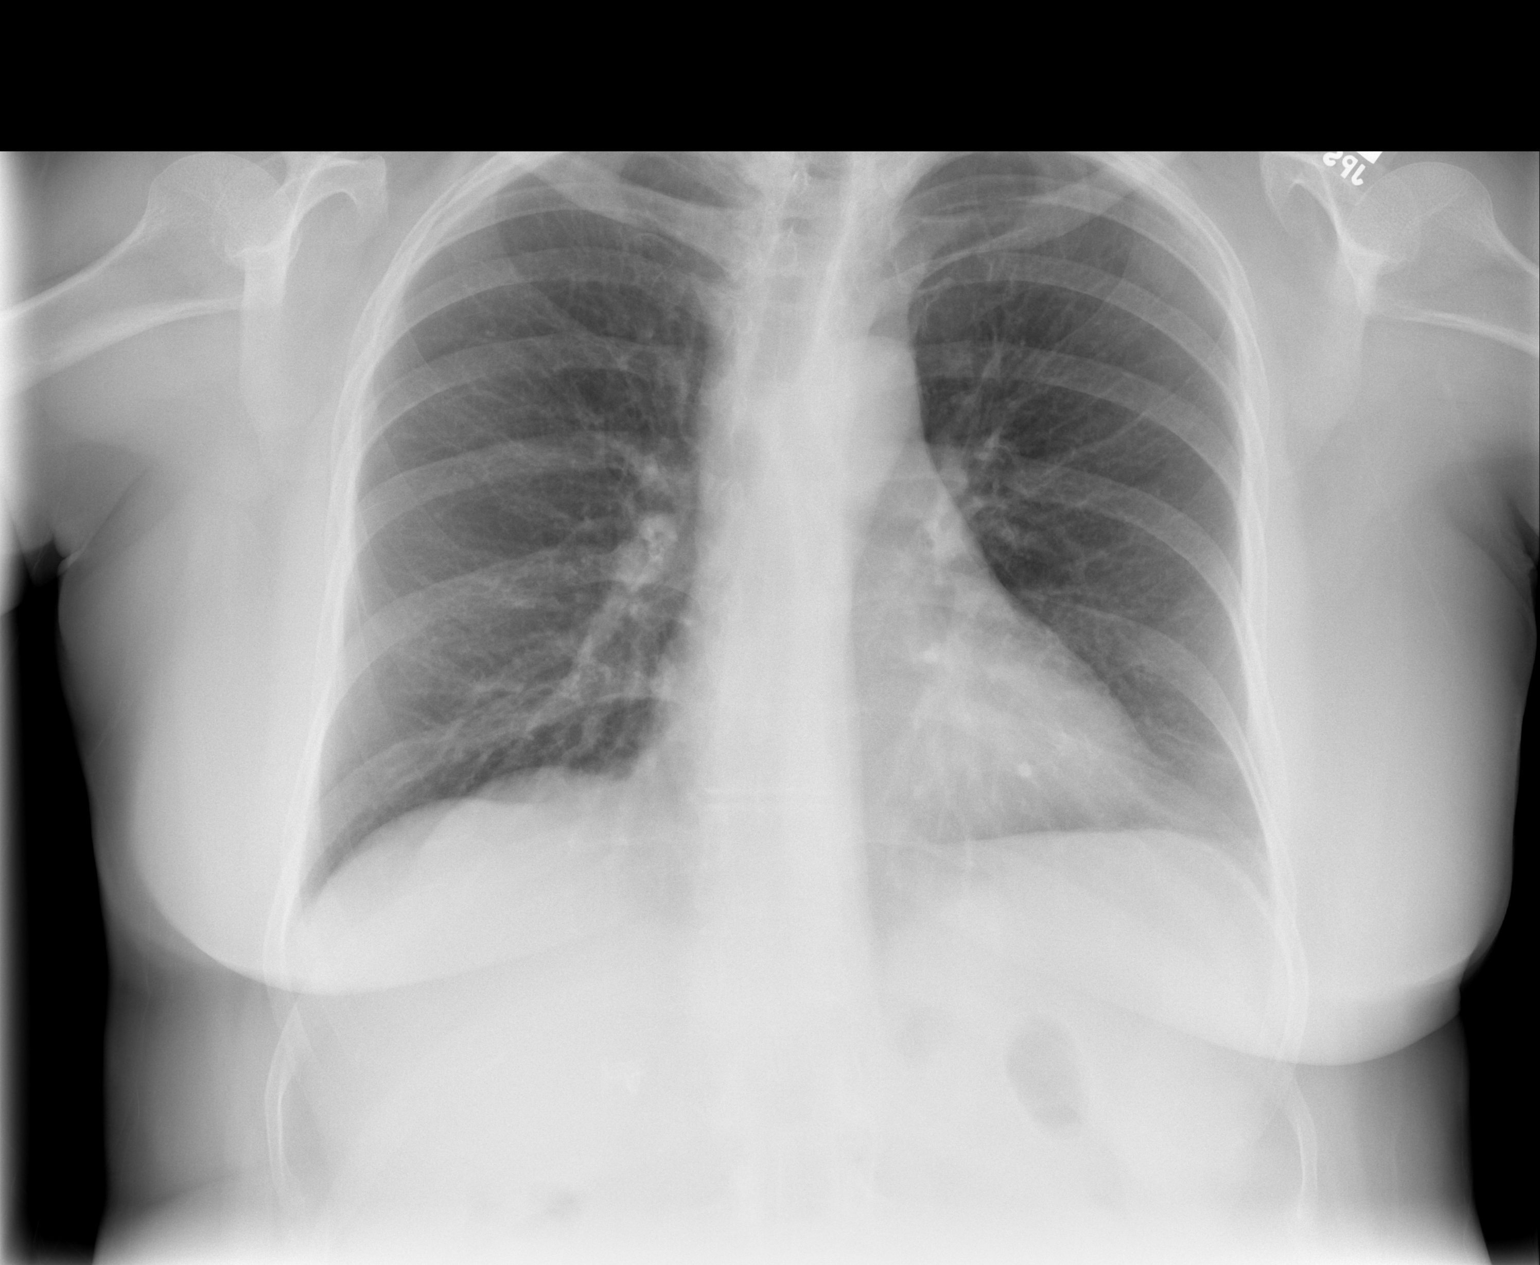

[w chest lat]
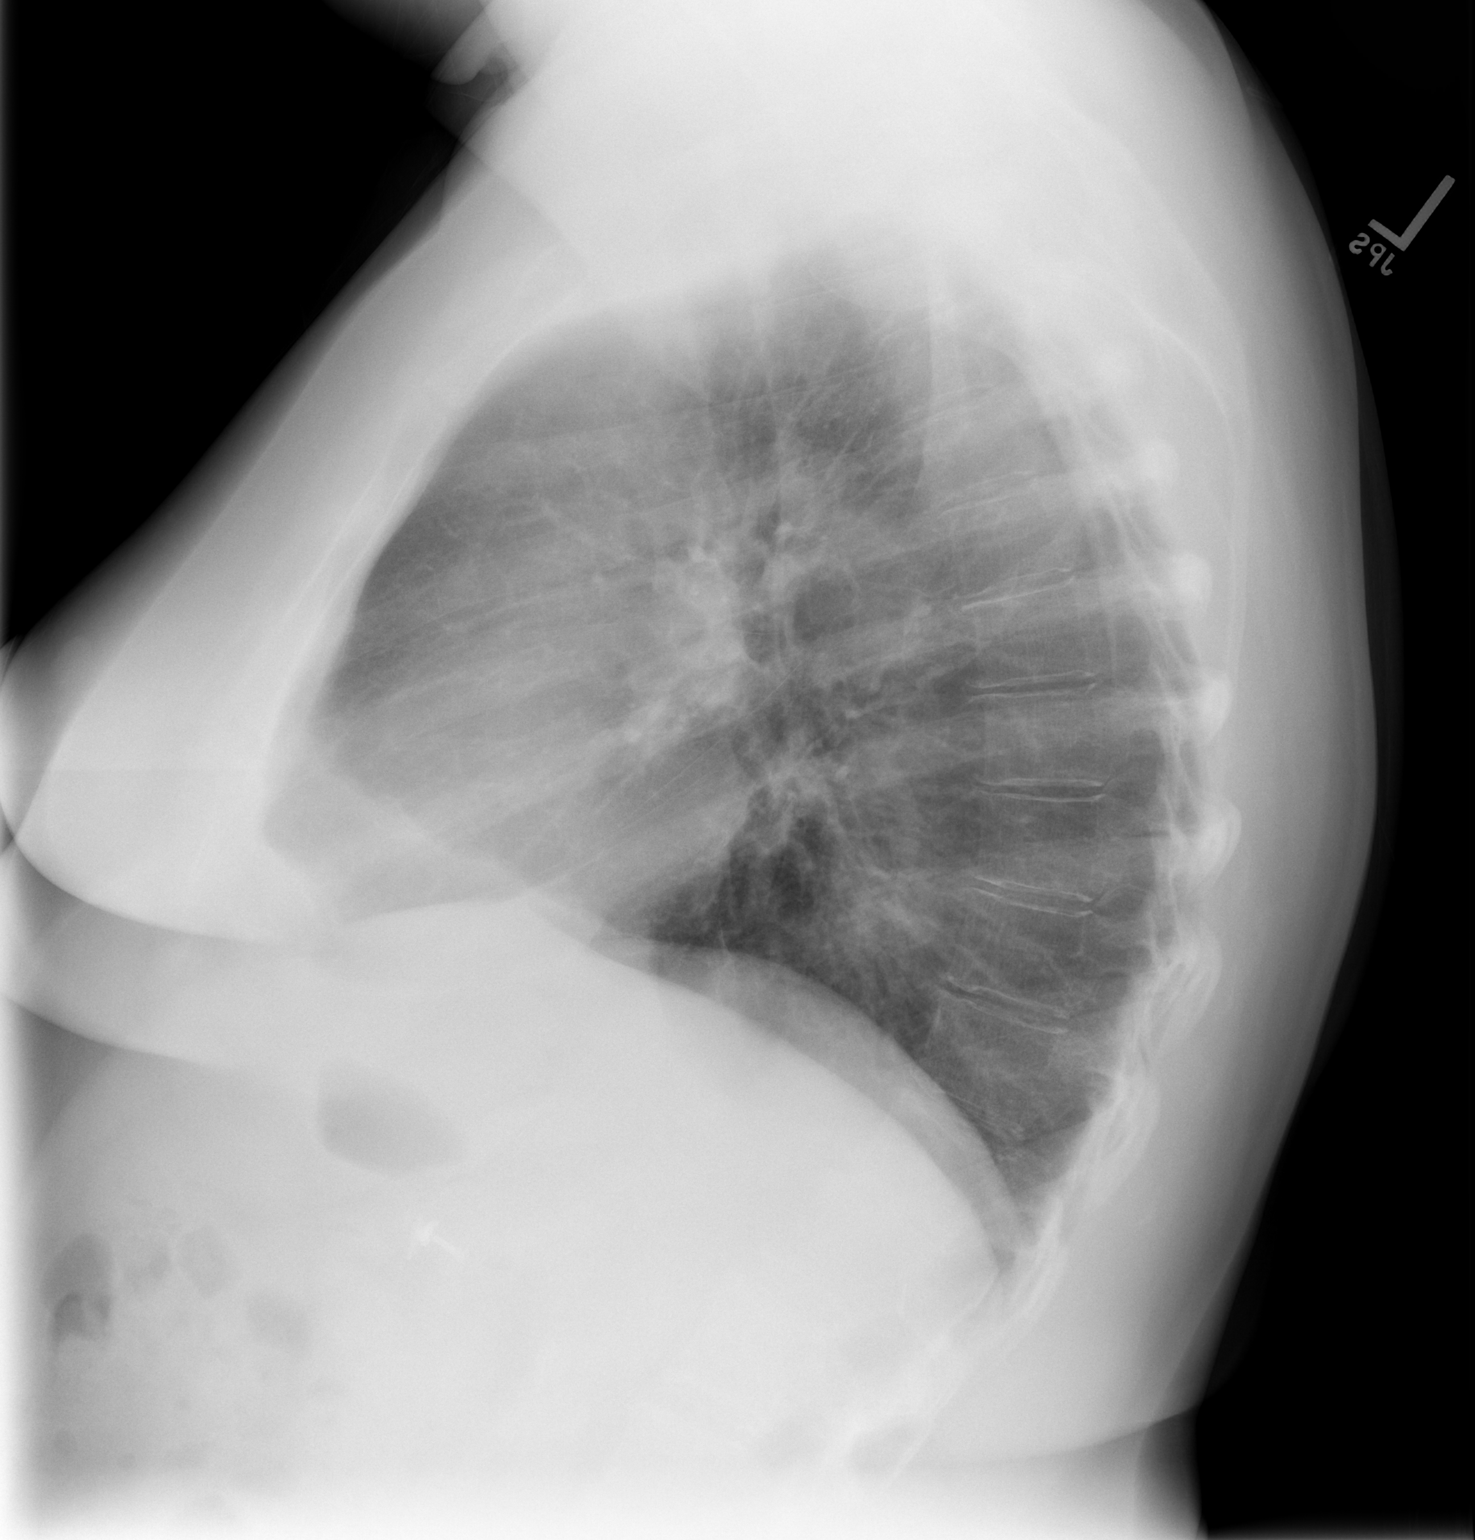

[2 of 2 positions shown; findings below may reference images not displayed]

FINDINGS: Heart size is normal.  Mediastinal shadows are normal.
The lungs are clear.  No effusions.  There is mild spinal
curvature.
IMPRESSION: No active cardiopulmonary disease.

## 2013-06-06 DIAGNOSIS — I25111 Atherosclerotic heart disease of native coronary artery with angina pectoris with documented spasm: Secondary | ICD-10-CM

## 2013-06-06 HISTORY — PX: CARDIAC CATHETERIZATION: SHX172

## 2013-06-06 HISTORY — DX: Atherosclerotic heart disease of native coronary artery with angina pectoris with documented spasm: I25.111

## 2013-07-03 ENCOUNTER — Inpatient Hospital Stay (HOSPITAL_COMMUNITY)
Admission: EM | Admit: 2013-07-03 | Discharge: 2013-07-05 | DRG: 287 | Disposition: A | Discharge status: Home / Self Care | Payer: 59 | Attending: Internal Medicine | Admitting: Internal Medicine

## 2013-07-03 ENCOUNTER — Encounter (HOSPITAL_COMMUNITY): Payer: Self-pay | Admitting: Emergency Medicine

## 2013-07-03 ENCOUNTER — Emergency Department (HOSPITAL_COMMUNITY): Payer: 59

## 2013-07-03 DIAGNOSIS — R079 Chest pain, unspecified: Secondary | ICD-10-CM | POA: Diagnosis present

## 2013-07-03 DIAGNOSIS — I200 Unstable angina: Secondary | ICD-10-CM

## 2013-07-03 DIAGNOSIS — E785 Hyperlipidemia, unspecified: Secondary | ICD-10-CM | POA: Diagnosis present

## 2013-07-03 DIAGNOSIS — Z981 Arthrodesis status: Secondary | ICD-10-CM

## 2013-07-03 DIAGNOSIS — Z7982 Long term (current) use of aspirin: Secondary | ICD-10-CM

## 2013-07-03 DIAGNOSIS — F411 Generalized anxiety disorder: Secondary | ICD-10-CM | POA: Diagnosis present

## 2013-07-03 DIAGNOSIS — I10 Essential (primary) hypertension: Secondary | ICD-10-CM | POA: Diagnosis present

## 2013-07-03 DIAGNOSIS — E119 Type 2 diabetes mellitus without complications: Secondary | ICD-10-CM | POA: Diagnosis present

## 2013-07-03 DIAGNOSIS — Z8249 Family history of ischemic heart disease and other diseases of the circulatory system: Secondary | ICD-10-CM

## 2013-07-03 DIAGNOSIS — E039 Hypothyroidism, unspecified: Secondary | ICD-10-CM | POA: Diagnosis present

## 2013-07-03 DIAGNOSIS — I2510 Atherosclerotic heart disease of native coronary artery without angina pectoris: Principal | ICD-10-CM | POA: Diagnosis present

## 2013-07-03 LAB — LIPASE, BLOOD: Lipase: 27 U/L (ref 11–59)

## 2013-07-03 LAB — CBC WITH DIFFERENTIAL/PLATELET
BASOS ABS: 0 10*3/uL (ref 0.0–0.1)
BASOS PCT: 0 % (ref 0–1)
Eosinophils Absolute: 0.1 10*3/uL (ref 0.0–0.7)
Eosinophils Relative: 2 % (ref 0–5)
HEMATOCRIT: 42.5 % (ref 36.0–46.0)
HEMOGLOBIN: 14.3 g/dL (ref 12.0–15.0)
LYMPHS PCT: 34 % (ref 12–46)
Lymphs Abs: 1.9 10*3/uL (ref 0.7–4.0)
MCH: 29.9 pg (ref 26.0–34.0)
MCHC: 33.6 g/dL (ref 30.0–36.0)
MCV: 88.9 fL (ref 78.0–100.0)
MONOS PCT: 5 % (ref 3–12)
Monocytes Absolute: 0.3 10*3/uL (ref 0.1–1.0)
NEUTROS ABS: 3.2 10*3/uL (ref 1.7–7.7)
Neutrophils Relative %: 59 % (ref 43–77)
RBC: 4.78 MIL/uL (ref 3.87–5.11)
RDW: 13.6 % (ref 11.5–15.5)
WBC: 5.5 10*3/uL (ref 4.0–10.5)

## 2013-07-03 LAB — COMPREHENSIVE METABOLIC PANEL
ALT: 23 U/L (ref 0–35)
AST: 27 U/L (ref 0–37)
Albumin: 4.3 g/dL (ref 3.5–5.2)
Alkaline Phosphatase: 152 U/L — ABNORMAL HIGH (ref 39–117)
BILIRUBIN TOTAL: 0.4 mg/dL (ref 0.3–1.2)
BUN: 25 mg/dL — ABNORMAL HIGH (ref 6–23)
CHLORIDE: 99 meq/L (ref 96–112)
CO2: 24 meq/L (ref 19–32)
CREATININE: 0.82 mg/dL (ref 0.50–1.10)
Calcium: 9.5 mg/dL (ref 8.4–10.5)
GFR calc Af Amer: 88 mL/min — ABNORMAL LOW (ref >90)
GFR, EST NON AFRICAN AMERICAN: 76 mL/min — AB (ref >90)
Glucose, Bld: 208 mg/dL — ABNORMAL HIGH (ref 70–99)
Potassium: 3.4 mEq/L — ABNORMAL LOW (ref 3.7–5.3)
Sodium: 139 mEq/L (ref 137–147)
Total Protein: 7.7 g/dL (ref 6.0–8.3)

## 2013-07-03 LAB — I-STAT TROPONIN, ED: Troponin i, poc: 0 ng/mL (ref 0.00–0.08)

## 2013-07-03 MED ORDER — HEPARIN BOLUS VIA INFUSION
4000.0000 [IU] | Freq: Once | INTRAVENOUS | Status: AC
  Administered 2013-07-04: 4000 [IU] via INTRAVENOUS
  Filled 2013-07-03: qty 4000

## 2013-07-03 MED ORDER — ASPIRIN 81 MG PO CHEW
324.0000 mg | CHEWABLE_TABLET | Freq: Once | ORAL | Status: AC
  Administered 2013-07-03: 324 mg via ORAL
  Filled 2013-07-03: qty 4

## 2013-07-03 MED ORDER — MORPHINE SULFATE 4 MG/ML IJ SOLN
4.0000 mg | Freq: Once | INTRAMUSCULAR | Status: AC
  Administered 2013-07-03: 4 mg via INTRAVENOUS
  Filled 2013-07-03: qty 1

## 2013-07-03 MED ORDER — ONDANSETRON HCL 4 MG/2ML IJ SOLN
4.0000 mg | Freq: Once | INTRAMUSCULAR | Status: AC
  Administered 2013-07-03: 4 mg via INTRAVENOUS

## 2013-07-03 MED ORDER — NITROGLYCERIN 0.4 MG SL SUBL
0.4000 mg | SUBLINGUAL_TABLET | SUBLINGUAL | Status: DC | PRN
  Administered 2013-07-03 (×2): 0.4 mg via SUBLINGUAL

## 2013-07-03 MED ORDER — ONDANSETRON HCL 4 MG/2ML IJ SOLN
INTRAMUSCULAR | Status: AC
  Administered 2013-07-03: 4 mg via INTRAVENOUS
  Filled 2013-07-03: qty 2

## 2013-07-03 MED ORDER — NITROGLYCERIN 2 % TD OINT
0.5000 [in_us] | TOPICAL_OINTMENT | Freq: Four times a day (QID) | TRANSDERMAL | Status: DC
  Administered 2013-07-03: 0.5 [in_us] via TOPICAL
  Filled 2013-07-03: qty 1

## 2013-07-03 MED ORDER — ONDANSETRON 4 MG PO TBDP
8.0000 mg | ORAL_TABLET | Freq: Once | ORAL | Status: AC
  Administered 2013-07-03: 8 mg via ORAL
  Filled 2013-07-03: qty 2

## 2013-07-03 MED ORDER — HEPARIN (PORCINE) IN NACL 100-0.45 UNIT/ML-% IJ SOLN
900.0000 [IU]/h | INTRAMUSCULAR | Status: DC
  Administered 2013-07-04: 900 [IU]/h via INTRAVENOUS
  Filled 2013-07-03 (×2): qty 250

## 2013-07-03 NOTE — ED Provider Notes (Signed)
CSN: [id number]     Arrival date & time 07/03/13  2057 History   First MD Initiated Contact with Patient 07/03/13 2308     Chief Complaint  Patient presents with  . Chest Pain  . Shortness of Breath  . Nausea  . Emesis  . Neck Pain     (Consider location/radiation/quality/duration/timing/severity/associated sxs/prior Treatment) HPI Comments: Pt with hx of HTN, HL comes in with cc of chest pain. Pt started having unprovoked chest pain while she was in her car. Pain is left sided with radiation to the jaw and left shoulder. Patient has associated n/dib. No hx of similar sx in the past. No exertional, musculoskeletal or pleuritic component to the chest pain. + hx of CAD in the fam.  Patient is a 63 y.o. female presenting with chest pain, shortness of breath, vomiting, and neck pain. The history is provided by the patient.  Chest Pain Associated symptoms: shortness of breath and vomiting   Associated symptoms: no abdominal pain, no headache and no nausea   Shortness of Breath Associated symptoms: chest pain, neck pain and vomiting   Associated symptoms: no abdominal pain and no headaches   Emesis Associated symptoms: no abdominal pain and no headaches   Neck Pain Associated symptoms: chest pain   Associated symptoms: no headaches     Past Medical History  Diagnosis Date  . Complication of anesthesia     extreme sore throat after aneathesia,   also      "woke up screamimg" after surgery  . Hypothyroidism   . Hypertension     stress test 10 yrs ago.   pcp   dr Lennette Bihari little  . Anxiety   . Hyperlipemia   . Breast cyst 3/01    left  . Osteopenia    Past Surgical History  Procedure Laterality Date  . Laparoscopic cholecystectomy  4/97  . Wrist fracture surgery    . Dilation and curettage of uterus    . Tonsillectomy    . Posterior cervical fusion/foraminotomy  04/04/2011    Procedure: POSTERIOR CERVICAL FUSION/FORAMINOTOMY LEVEL 1;  Surgeon: Charlie Pitter, MD;  Location: [place] [facility];  Service: Neurosurgery;  Laterality: Left;  Left Cervical Six-Seven Laminectomy, Foraminotomy, Diskectomy    No family history on file. History  Substance Use Topics  . Smoking status: Never Smoker   . Smokeless tobacco: Never Used  . Alcohol Use: No   OB History   Grav Para Term Preterm Abortions TAB SAB Ect Mult Living   2 2        2      Review of Systems  Constitutional: Positive for activity change.  Respiratory: Positive for shortness of breath.   Cardiovascular: Positive for chest pain.  Gastrointestinal: Positive for vomiting. Negative for nausea and abdominal pain.  Genitourinary: Negative for dysuria.  Musculoskeletal: Positive for neck pain.  Neurological: Negative for headaches.  All other systems reviewed and are negative.     Allergies  Prilosec; Sulfa antibiotics; and Ultram  Home Medications   Prior to Admission medications   Medication Sig Start Date End Date Taking? Authorizing Provider  ALPRAZolam Duanne Moron) 0.5 MG tablet Take 0.5 mg by mouth at bedtime as needed. For sleep   Yes Historical Provider, MD  aspirin EC 81 MG tablet Take 81 mg by mouth daily.   Yes Historical Provider, MD  atorvastatin (LIPITOR) 40 MG tablet Take 40 mg by mouth daily.   Yes Historical Provider, MD  fish oil-omega-3 fatty acids 1000  MG capsule Take 1 g by mouth daily.   Yes Historical Provider, MD  hydrochlorothiazide (MICROZIDE) 12.5 MG capsule Take 12.5 mg by mouth daily.   Yes Historical Provider, MD  hydrocortisone (ANUSOL-HC) 2.5 % rectal cream Place rectally 2 (two) times daily. 06/10/12  Yes Lyman Speller, MD  levothyroxine (SYNTHROID, LEVOTHROID) 125 MCG tablet Take 125 mcg by mouth daily.   Yes Historical Provider, MD  Multiple Vitamin (MULITIVITAMIN WITH MINERALS) TABS Take 1 tablet by mouth daily.   Yes Historical Provider, MD  sertraline (ZOLOFT) 50 MG tablet Take 25 mg by mouth daily.   Yes Historical Provider, MD  valsartan (DIOVAN) 40 MG tablet Take  40 mg by mouth daily.   Yes Historical Provider, MD   BP 121/58  Pulse 76  Temp(Src) 98.6 F (37 C) (Oral)  Resp 16  Ht 5\' 4"  (1.626 m)  Wt 177 lb (80.287 kg)  BMI 30.37 kg/m2  SpO2 97%  LMP 02/06/2010 Physical Exam  Nursing note and vitals reviewed. Constitutional: She is oriented to person, place, and time. She appears well-developed and well-nourished.  HENT:  Head: Normocephalic and atraumatic.  Eyes: EOM are normal. Pupils are equal, round, and reactive to light.  Neck: Neck supple.  Cardiovascular: Normal rate, regular rhythm and normal heart sounds.   No murmur heard. Pulmonary/Chest: Effort normal. No respiratory distress.  Abdominal: Soft. She exhibits no distension. There is no tenderness. There is no rebound and no guarding.  Neurological: She is alert and oriented to person, place, and time.  Skin: Skin is warm and dry.    ED Course  Procedures (including critical care time) Labs Review Labs Reviewed  COMPREHENSIVE METABOLIC PANEL - Abnormal; Notable for the following:    Potassium 3.4 (*)    Glucose, Bld 208 (*)    BUN 25 (*)    Alkaline Phosphatase 152 (*)    GFR calc non Af Amer 76 (*)    GFR calc Af Amer 88 (*)    All other components within normal limits  CBC WITH DIFFERENTIAL  LIPASE, BLOOD  HEPARIN LEVEL (UNFRACTIONATED)  CBC  COMPREHENSIVE METABOLIC PANEL  CBC WITH DIFFERENTIAL  PRO B NATRIURETIC PEPTIDE  TSH  TROPONIN I  TROPONIN I  TROPONIN I  HEMOGLOBIN A1C  I-STAT TROPOININ, ED    Imaging Review No results found.   EKG Interpretation   Date/Time:  Thursday Jul 03 2013 23:25:31 EDT Ventricular Rate:  79 PR Interval:  170 QRS Duration: 95 QT Interval:  391 QTC Calculation: 448 R Axis:   68 Text Interpretation:  Sinus rhythm Confirmed by Haider Hornaday, MD, Thelma Comp  [phone]) on 07/03/2013 11:48:16 PM      MDM   Final diagnoses:  Unstable angina    Pt comes in with cc of chest pain.  History Highly suspicious Highly  suspicious 2    ECG  Non specific repolarisation disturbance 1   Age  31 - 65 years 1    Risk Factors  ? 3 risk factors or history of atherosclerotic disease 2    Troponin  1 so far is negative.  DDX includes: Differential diagnosis includes: ACS syndrome CHF exacerbation Valvular disorder Myocarditis Pericarditis Pericardial effusion Pneumonia Pleural effusion Pulmonary edema PE Anemia Musculoskeletal pain Dissection  However, based on hx, and exam and lack of pleuritic component or m-s component and clear lung exam - we will have to r/o ACS.  Pt has persistent jaw pain. Nitro helped with the chest pain, and it has resolved. EKG  shows q wave in the inf leads, with no ST changes  -? Old infarct Nitro paste ordered. Heparin to be started. Cards and medicine consulted.   CRITICAL CARE Performed by: Gordana Kewley   Total critical care time: 45 min  Critical care time was exclusive of separately billable procedures and treating other patients.  Critical care was necessary to treat or prevent imminent or life-threatening deterioration.  Critical care was time spent personally by me on the following activities: development of treatment plan with patient and/or surrogate as well as nursing, discussions with consultants, evaluation of patient's response to treatment, examination of patient, obtaining history from patient or surrogate, ordering and performing treatments and interventions, ordering and review of laboratory studies, ordering and review of radiographic studies, pulse oximetry and re-evaluation of patient's condition.    Varney Biles, MD 07/04/13 0004

## 2013-07-03 NOTE — ED Notes (Addendum)
Pt remains alert, NAD, calm, interactive, nauseated, intermittent wretching/ dry heaves, with improving pain after ntg. Husband at chairside in triage.

## 2013-07-03 NOTE — ED Notes (Signed)
C/o CP, radiating to L neck, shoulder & jaw. Also nv and sob. Baby ASA taken this am. Rates pain 6/10. EKG ST 114.

## 2013-07-03 NOTE — ED Notes (Signed)
Dr. Newton at bedside. 

## 2013-07-03 NOTE — Progress Notes (Signed)
ANTICOAGULATION CONSULT NOTE - Initial Consult  Pharmacy Consult for Heparin  Indication: chest pain/ACS  Allergies  Allergen Reactions  . Prilosec [Omeprazole Magnesium] Other (See Comments)    Blisters and skin peels off  . Sulfa Antibiotics Other (See Comments)    Blisters and skin peels off  . Ultram [Tramadol Hcl]     Lapse of memory      Patient Measurements: Height: 5\' 4"  (162.6 cm) Weight: 177 lb (80.287 kg) IBW/kg (Calculated) : 54.7 Heparin Dosing Weight: ~71kg   Vital Signs: Temp: 98.6 F (37 C) (05/28 2157) Temp src: Oral (05/28 2157) BP: 121/58 mmHg (05/28 2326) Pulse Rate: 76 (05/28 2326)  Labs:  Recent Labs  07/03/13 2131  HGB 14.3  HCT 42.5  PLT PLATELET CLUMPS NOTED ON SMEAR, COUNT APPEARS INCREASED  CREATININE 0.82   Estimated Creatinine Clearance: 73.8 ml/min (by C-G formula based on Cr of 0.82).  Medical History: Past Medical History  Diagnosis Date  . Complication of anesthesia     extreme sore throat after aneathesia,   also      "woke up screamimg" after surgery  . Hypothyroidism   . Hypertension     stress test 10 yrs ago.   pcp   dr Lennette Bihari little  . Anxiety   . Hyperlipemia   . Breast cyst 3/01    left  . Osteopenia     Assessment: 63 y/o F to start heparin for CP. CBC good, renal function appropriate for age, first troponin negative, other labs as above.   Goal of Therapy:  Heparin level 0.3-0.7 units/ml Monitor platelets by anticoagulation protocol: Yes   Plan:  -Heparin 4000 units BOLUS x 1 -Start heparin drip at 900 units/hr -0700 HL -Daily CBC/HL -Monitor for bleeding  Narda Bonds 07/03/2013,11:54 PM

## 2013-07-04 ENCOUNTER — Encounter (HOSPITAL_COMMUNITY)
Admission: EM | Disposition: A | Discharge status: Home / Self Care | Payer: 59 | Source: Home / Self Care | Attending: Internal Medicine

## 2013-07-04 DIAGNOSIS — E039 Hypothyroidism, unspecified: Secondary | ICD-10-CM | POA: Diagnosis present

## 2013-07-04 DIAGNOSIS — I200 Unstable angina: Secondary | ICD-10-CM

## 2013-07-04 DIAGNOSIS — I10 Essential (primary) hypertension: Secondary | ICD-10-CM | POA: Diagnosis present

## 2013-07-04 DIAGNOSIS — E785 Hyperlipidemia, unspecified: Secondary | ICD-10-CM | POA: Diagnosis present

## 2013-07-04 DIAGNOSIS — R079 Chest pain, unspecified: Secondary | ICD-10-CM | POA: Diagnosis present

## 2013-07-04 DIAGNOSIS — I2510 Atherosclerotic heart disease of native coronary artery without angina pectoris: Principal | ICD-10-CM

## 2013-07-04 DIAGNOSIS — E119 Type 2 diabetes mellitus without complications: Secondary | ICD-10-CM | POA: Diagnosis present

## 2013-07-04 HISTORY — PX: LEFT HEART CATHETERIZATION WITH CORONARY ANGIOGRAM: SHX5451

## 2013-07-04 LAB — CBC WITH DIFFERENTIAL/PLATELET
BASOS ABS: 0 10*3/uL (ref 0.0–0.1)
Basophils Relative: 1 % (ref 0–1)
Eosinophils Absolute: 0.1 10*3/uL (ref 0.0–0.7)
Eosinophils Relative: 1 % (ref 0–5)
HEMATOCRIT: 40.6 % (ref 36.0–46.0)
HEMOGLOBIN: 13.9 g/dL (ref 12.0–15.0)
LYMPHS PCT: 19 % (ref 12–46)
Lymphs Abs: 1.7 10*3/uL (ref 0.7–4.0)
MCH: 30.2 pg (ref 26.0–34.0)
MCHC: 34.2 g/dL (ref 30.0–36.0)
MCV: 88.3 fL (ref 78.0–100.0)
MONOS PCT: 5 % (ref 3–12)
Monocytes Absolute: 0.4 10*3/uL (ref 0.1–1.0)
NEUTROS ABS: 6.6 10*3/uL (ref 1.7–7.7)
Neutrophils Relative %: 74 % (ref 43–77)
Platelets: 205 10*3/uL (ref 150–400)
RBC: 4.6 MIL/uL (ref 3.87–5.11)
RDW: 13.8 % (ref 11.5–15.5)
WBC: 8.7 10*3/uL (ref 4.0–10.5)

## 2013-07-04 LAB — TROPONIN I: Troponin I: <0.3 ng/mL (ref <0.30)

## 2013-07-04 LAB — COMPREHENSIVE METABOLIC PANEL
ALT: 23 U/L (ref 0–35)
AST: 27 U/L (ref 0–37)
Albumin: 4.2 g/dL (ref 3.5–5.2)
Alkaline Phosphatase: 152 U/L — ABNORMAL HIGH (ref 39–117)
BUN: 24 mg/dL — AB (ref 6–23)
CALCIUM: 9.9 mg/dL (ref 8.4–10.5)
CO2: 26 mEq/L (ref 19–32)
Chloride: 101 mEq/L (ref 96–112)
Creatinine, Ser: 0.78 mg/dL (ref 0.50–1.10)
GFR calc non Af Amer: 88 mL/min — ABNORMAL LOW (ref >90)
Glucose, Bld: 101 mg/dL — ABNORMAL HIGH (ref 70–99)
Potassium: 3.7 mEq/L (ref 3.7–5.3)
Sodium: 142 mEq/L (ref 137–147)
TOTAL PROTEIN: 7.6 g/dL (ref 6.0–8.3)
Total Bilirubin: 0.4 mg/dL (ref 0.3–1.2)

## 2013-07-04 LAB — HEPARIN LEVEL (UNFRACTIONATED): Heparin Unfractionated: 0.69 IU/mL (ref 0.30–0.70)

## 2013-07-04 LAB — CBC
HCT: 36.1 % (ref 36.0–46.0)
Hemoglobin: 11.9 g/dL — ABNORMAL LOW (ref 12.0–15.0)
MCH: 29.8 pg (ref 26.0–34.0)
MCHC: 33 g/dL (ref 30.0–36.0)
MCV: 90.3 fL (ref 78.0–100.0)
PLATELETS: 185 10*3/uL (ref 150–400)
RBC: 4 MIL/uL (ref 3.87–5.11)
RDW: 13.8 % (ref 11.5–15.5)
WBC: 6 10*3/uL (ref 4.0–10.5)

## 2013-07-04 LAB — CREATININE, SERUM
CREATININE: 1 mg/dL (ref 0.50–1.10)
GFR, EST AFRICAN AMERICAN: 69 mL/min — AB (ref >90)
GFR, EST NON AFRICAN AMERICAN: 60 mL/min — AB (ref >90)

## 2013-07-04 LAB — HEMOGLOBIN A1C
Hgb A1c MFr Bld: 5.7 % — ABNORMAL HIGH (ref <5.7)
Mean Plasma Glucose: 117 mg/dL — ABNORMAL HIGH (ref <117)

## 2013-07-04 LAB — PRO B NATRIURETIC PEPTIDE: Pro B Natriuretic peptide (BNP): 25.6 pg/mL (ref 0–125)

## 2013-07-04 LAB — MRSA PCR SCREENING: MRSA by PCR: NEGATIVE

## 2013-07-04 LAB — PROTIME-INR
INR: 1.04 (ref 0.00–1.49)
Prothrombin Time: 13.4 seconds (ref 11.6–15.2)

## 2013-07-04 LAB — TSH: TSH: 1.24 u[IU]/mL (ref 0.350–4.500)

## 2013-07-04 SURGERY — LEFT HEART CATHETERIZATION WITH CORONARY ANGIOGRAM
Anesthesia: LOCAL

## 2013-07-04 MED ORDER — ASPIRIN EC 81 MG PO TBEC
81.0000 mg | DELAYED_RELEASE_TABLET | Freq: Every day | ORAL | Status: DC
  Administered 2013-07-04 – 2013-07-05 (×2): 81 mg via ORAL
  Filled 2013-07-04 (×2): qty 1

## 2013-07-04 MED ORDER — HEPARIN (PORCINE) IN NACL 2-0.9 UNIT/ML-% IJ SOLN
INTRAMUSCULAR | Status: AC
  Filled 2013-07-04: qty 1000

## 2013-07-04 MED ORDER — HEPARIN SODIUM (PORCINE) 5000 UNIT/ML IJ SOLN
5000.0000 [IU] | Freq: Three times a day (TID) | INTRAMUSCULAR | Status: DC
  Administered 2013-07-05: 06:00:00 5000 [IU] via SUBCUTANEOUS
  Filled 2013-07-04 (×5): qty 1

## 2013-07-04 MED ORDER — NITROGLYCERIN IN D5W 200-5 MCG/ML-% IV SOLN
2.0000 ug/min | INTRAVENOUS | Status: DC
  Administered 2013-07-04: 2 ug/min via INTRAVENOUS
  Filled 2013-07-04: qty 250

## 2013-07-04 MED ORDER — OMEGA-3-ACID ETHYL ESTERS 1 G PO CAPS
1.0000 g | ORAL_CAPSULE | Freq: Every day | ORAL | Status: DC
  Administered 2013-07-04: 1 g via ORAL
  Filled 2013-07-04 (×2): qty 1

## 2013-07-04 MED ORDER — LEVOTHYROXINE SODIUM 125 MCG PO TABS
125.0000 ug | ORAL_TABLET | Freq: Every day | ORAL | Status: DC
  Administered 2013-07-04 – 2013-07-05 (×2): 125 ug via ORAL
  Filled 2013-07-04 (×3): qty 1

## 2013-07-04 MED ORDER — METOPROLOL TARTRATE 12.5 MG HALF TABLET
12.5000 mg | ORAL_TABLET | Freq: Two times a day (BID) | ORAL | Status: DC
  Administered 2013-07-04 – 2013-07-05 (×3): 12.5 mg via ORAL
  Filled 2013-07-04 (×4): qty 1

## 2013-07-04 MED ORDER — OMEGA-3 FATTY ACIDS 1000 MG PO CAPS: 1.0000 g | ORAL_CAPSULE | Freq: Every day | ORAL | Status: DC

## 2013-07-04 MED ORDER — SODIUM CHLORIDE 0.9 % IV SOLN: 250.0000 mL | INTRAVENOUS | Status: DC | PRN

## 2013-07-04 MED ORDER — HEPARIN SODIUM (PORCINE) 1000 UNIT/ML IJ SOLN
INTRAMUSCULAR | Status: AC
  Filled 2013-07-04: qty 1

## 2013-07-04 MED ORDER — SODIUM CHLORIDE 0.9 % IJ SOLN: 3.0000 mL | INTRAMUSCULAR | Status: DC | PRN

## 2013-07-04 MED ORDER — ISOSORBIDE MONONITRATE ER 30 MG PO TB24
30.0000 mg | ORAL_TABLET | Freq: Every day | ORAL | Status: DC
  Administered 2013-07-04 – 2013-07-05 (×2): 30 mg via ORAL
  Filled 2013-07-04 (×3): qty 1

## 2013-07-04 MED ORDER — CLOPIDOGREL BISULFATE 75 MG PO TABS
75.0000 mg | ORAL_TABLET | Freq: Every day | ORAL | Status: DC
  Administered 2013-07-04 – 2013-07-05 (×2): 75 mg via ORAL
  Filled 2013-07-04 (×2): qty 1

## 2013-07-04 MED ORDER — ONDANSETRON HCL 4 MG/2ML IJ SOLN
4.0000 mg | Freq: Four times a day (QID) | INTRAMUSCULAR | Status: DC | PRN
  Administered 2013-07-04: 4 mg via INTRAVENOUS
  Filled 2013-07-04: qty 2

## 2013-07-04 MED ORDER — ONDANSETRON HCL 4 MG/2ML IJ SOLN
4.0000 mg | INTRAMUSCULAR | Status: DC | PRN
  Administered 2013-07-04 (×2): 4 mg via INTRAVENOUS
  Filled 2013-07-04 (×2): qty 2

## 2013-07-04 MED ORDER — FENTANYL CITRATE 0.05 MG/ML IJ SOLN
INTRAMUSCULAR | Status: AC
  Filled 2013-07-04: qty 2

## 2013-07-04 MED ORDER — MIDAZOLAM HCL 2 MG/2ML IJ SOLN
INTRAMUSCULAR | Status: AC
  Filled 2013-07-04: qty 2

## 2013-07-04 MED ORDER — SODIUM CHLORIDE 0.9 % IJ SOLN: 3.0000 mL | Freq: Two times a day (BID) | INTRAMUSCULAR | Status: DC

## 2013-07-04 MED ORDER — ALPRAZOLAM 0.5 MG PO TABS
0.5000 mg | ORAL_TABLET | Freq: Every evening | ORAL | Status: DC | PRN
  Administered 2013-07-04: 21:00:00 0.5 mg via ORAL
  Filled 2013-07-04: qty 1

## 2013-07-04 MED ORDER — NITROGLYCERIN 0.2 MG/ML ON CALL CATH LAB
INTRAVENOUS | Status: AC
  Filled 2013-07-04: qty 1

## 2013-07-04 MED ORDER — SODIUM CHLORIDE 0.9 % IV SOLN: 1.0000 mL/kg/h | INTRAVENOUS | Status: DC

## 2013-07-04 MED ORDER — LIDOCAINE HCL (PF) 1 % IJ SOLN
INTRAMUSCULAR | Status: AC
  Filled 2013-07-04: qty 30

## 2013-07-04 MED ORDER — MORPHINE SULFATE 2 MG/ML IJ SOLN
1.0000 mg | INTRAMUSCULAR | Status: DC | PRN
  Administered 2013-07-04: 2 mg via INTRAVENOUS
  Filled 2013-07-04: qty 1

## 2013-07-04 MED ORDER — ATORVASTATIN CALCIUM 40 MG PO TABS
40.0000 mg | ORAL_TABLET | Freq: Every day | ORAL | Status: DC
  Administered 2013-07-04 – 2013-07-05 (×2): 40 mg via ORAL
  Filled 2013-07-04 (×2): qty 1

## 2013-07-04 MED ORDER — SERTRALINE HCL 25 MG PO TABS
25.0000 mg | ORAL_TABLET | Freq: Every day | ORAL | Status: DC
  Administered 2013-07-04: 25 mg via ORAL
  Filled 2013-07-04 (×2): qty 1

## 2013-07-04 MED ORDER — ADULT MULTIVITAMIN W/MINERALS CH
1.0000 | ORAL_TABLET | Freq: Every day | ORAL | Status: DC
  Administered 2013-07-04: 1 via ORAL
  Filled 2013-07-04 (×2): qty 1

## 2013-07-04 NOTE — Progress Notes (Signed)
Utilization Review Completed.Neoma Laming T Dowell5/29/2015

## 2013-07-04 NOTE — Progress Notes (Signed)
TR BAND REMOVAL  LOCATION:  right radial  DEFLATED PER PROTOCOL:  yes  TIME BAND OFF / DRESSING APPLIED: 2145   SITE UPON ARRIVAL:   Level 0  SITE AFTER BAND REMOVAL:  Level 0  REVERSE ALLEN'S TEST:    positive  CIRCULATION SENSATION AND MOVEMENT:  Within Normal Limits  yes  COMMENTS:    

## 2013-07-04 NOTE — Consult Note (Signed)
CARDIOLOGY CONSULT NOTE  Patient ID: Veronica [name]  MRN: [id number]  DOB: October 03, 1950  Admit date: 07/03/2013 Date of Consult: 07/04/2013  Primary Physician: Gennette Pac, MD Primary Cardiologist: None  Reason for Consultation: Unstable Angina  History of Present Illness: Veronica [name] is a 63 y.o. female with a history of HTN, DMII, Hyperlipidemia, who presents with new onset chest pain.  Started at 8:15pm tonight while driving.  Substernal chest pressure with radiation to L arm and neck.  +N/V.  +diaphoresis.  Pain persisted until she received nitroglycerin in ED.  Not pleuritic or positional.  She has never had pain like this before.  She is generally active, walks, works out at gym.  No angina when exercising recently.  Troponin neg x1  Past Medical History  Diagnosis Date  . Complication of anesthesia     extreme sore throat after aneathesia,   also      "woke up screamimg" after surgery  . Hypothyroidism   . Hypertension     stress test 10 yrs ago.   pcp   dr Lennette Bihari little  . Anxiety   . Hyperlipemia   . Breast cyst 3/01    left  . Osteopenia     Past Surgical History  Procedure Laterality Date  . Laparoscopic cholecystectomy  4/97  . Wrist fracture surgery    . Dilation and curettage of uterus    . Tonsillectomy    . Posterior cervical fusion/foraminotomy  04/04/2011    Procedure: POSTERIOR CERVICAL FUSION/FORAMINOTOMY LEVEL 1;  Surgeon: Charlie Pitter, MD;  Location: [place] [facility];  Service: Neurosurgery;  Laterality: Left;  Left Cervical Six-Seven Laminectomy, Foraminotomy, Diskectomy      Home Meds: Prior to Admission medications   Medication Sig Start Date End Date Taking? Authorizing Provider  ALPRAZolam Duanne Moron) 0.5 MG tablet Take 0.5 mg by mouth at bedtime as needed. For sleep   Yes Historical Provider, MD  aspirin EC 81 MG tablet Take 81 mg by mouth daily.   Yes Historical Provider, MD  atorvastatin (LIPITOR) 40 MG tablet Take 40 mg by mouth daily.   Yes  Historical Provider, MD  fish oil-omega-3 fatty acids 1000 MG capsule Take 1 g by mouth daily.   Yes Historical Provider, MD  hydrochlorothiazide (MICROZIDE) 12.5 MG capsule Take 12.5 mg by mouth daily.   Yes Historical Provider, MD  hydrocortisone (ANUSOL-HC) 2.5 % rectal cream Place rectally 2 (two) times daily. 06/10/12  Yes Lyman Speller, MD  levothyroxine (SYNTHROID, LEVOTHROID) 125 MCG tablet Take 125 mcg by mouth daily.   Yes Historical Provider, MD  Multiple Vitamin (MULITIVITAMIN WITH MINERALS) TABS Take 1 tablet by mouth daily.   Yes Historical Provider, MD  sertraline (ZOLOFT) 50 MG tablet Take 25 mg by mouth daily.   Yes Historical Provider, MD  valsartan (DIOVAN) 40 MG tablet Take 40 mg by mouth daily.   Yes Historical Provider, MD    Current Medications: . aspirin EC  81 mg Oral Daily  . atorvastatin  40 mg Oral Daily  . levothyroxine  125 mcg Oral QAC breakfast  . multivitamin with minerals  1 tablet Oral Daily  . omega-3 acid ethyl esters  1 g Oral Daily  . sertraline  25 mg Oral Daily  . sodium chloride  3 mL Intravenous Q12H     Allergies:    Allergies  Allergen Reactions  . Prilosec [Omeprazole Magnesium] Other (See Comments)    Blisters and skin peels off  .  Sulfa Antibiotics Other (See Comments)    Blisters and skin peels off  . Ultram [Tramadol Hcl]     Lapse of memory      Social History:   The patient  reports that she has never smoked. She has never used smokeless tobacco. She reports that she does not drink alcohol or use illicit drugs.    Family History:   The patient's family history is not on file.   ROS:  Please see the history of present illness.   All other systems reviewed and negative.   Vital Signs: Blood pressure 119/58, pulse 68, temperature 98.4 F (36.9 C), temperature source Oral, resp. rate 9, height 5\' 4"  (1.626 m), weight 80.2 kg (176 lb 12.9 oz), last menstrual period 02/06/2010, SpO2 95.00%.   PHYSICAL EXAM: General:  Well  nourished, well developed, in no acute distress HEENT: normal Lymph: no adenopathy Neck: no JVD Endocrine:  No thryomegaly Vascular: No carotid bruits; FA pulses 2+ bilaterally without bruits Cardiac:  normal S1, S2; RRR; no murmur Lungs:  clear to auscultation bilaterally, no wheezing, rhonchi or rales Abd: soft, nontender, no hepatomegaly Ext: no edema Musculoskeletal:  No deformities, BUE and BLE strength normal and equal Skin: warm and dry Neuro:  CNs 2-12 intact, no focal abnormalities noted Psych:  Normal affect   EKG:  NSR, no evidence of ischemia  Labs:  Recent Labs  07/04/13 0016  TROPONINI <0.30   Lab Results  Component Value Date   WBC 8.7 07/04/2013   HGB 13.9 07/04/2013   HCT 40.6 07/04/2013   MCV 88.3 07/04/2013   PLT 205 07/04/2013    Recent Labs Lab 07/04/13 0016  NA 142  K 3.7  CL 101  CO2 26  BUN 24*  CREATININE 0.78  CALCIUM 9.9  PROT 7.6  BILITOT 0.4  ALKPHOS 152*  ALT 23  AST 27  GLUCOSE 101*   No results found for this basename: CHOL, HDL, LDLCALC, TRIG   No results found for this basename: DDIMER    Radiology/Studies:  Dg Chest Portable 1 View  07/04/2013   CLINICAL DATA:  LEFT side chest pressure and shortness of breath for 2 hr, history hypertension  EXAM: PORTABLE CHEST - 1 VIEW  COMPARISON:  Portable exam 2332 hr compared to 03/27/2011  FINDINGS: Upper normal heart size accentuated by hypoinflation.  Mediastinal contours and pulmonary vascularity normal.  Decreased lung volumes with bibasilar atelectasis.  No definite infiltrate, pleural effusion or pneumothorax.  Bones unremarkable.  IMPRESSION: Decreased lung volumes with bibasilar atelectasis.   Electronically Signed   By: Lavonia Dana M.D.   On: 07/04/2013 00:13    ASSESSMENT AND PLAN:  1. Unstable Angina - Her story is consistent with unstable angina.  Several risk factors. - CP free now, vitals are at goal. - Agree with heparin, aspirin - Keep her NPO for possible cath vs.  Stress test today.  Cleotis Lema 07/04/2013 2:43 AM

## 2013-07-04 NOTE — CV Procedure (Addendum)
    Left Heart Catheterization with Coronary Angiography  Report  Veronica [name]  63 y.o.  female 24-Jun-1950  Procedure Date: 07/04/2013 Referring Physician: Peter Martinique, M.D. Primary Cardiologist: Peter Martinique, M.D.  INDICATIONS: Prolonged chest pain compatible with angina but unassociated with elevated markers or EKG changes. Pain did resolve with supplemental nitroglycerin.  PROCEDURE: 1. Left heart catheterization; 2. Coronary angiography; 3. Left ventriculography  CONSENT:  The risks, benefits, and details of the procedure were explained in detail to the patient. Risks including death, stroke, heart attack, kidney injury, allergy, limb ischemia, bleeding and radiation injury were discussed.  The patient verbalized understanding and wanted to proceed.  Informed written consent was obtained.  PROCEDURE TECHNIQUE:  After Xylocaine anesthesia a 5 French Slender sheath was placed in the right radial artery with an angiocath and the modified Seldinger technique.  Coronary angiography was done using a 5 F JR 4 and JL 3.5 cm diagnostic catheterS.  Left ventriculography was done using the JR 4 catheter and hand injection.   Hemostasis was achieved with a wrist band   CONTRAST:  Total of 100 cc.  COMPLICATIONS:  None   HEMODYNAMICS:  Aortic pressure 134/78 mmHg; LV pressure 134/12 mmHg; LVEDP 19 mm mercury  ANGIOGRAPHIC DATA:   The left main coronary artery is widely patent but with calcified plaque noted.  The left anterior descending artery is diffusely diseased and calcified throughout the proximal to mid segment. In the midsegment after the origin of the second diagonal there is eccentric 60-75% stenosis in the LAD. The second diagonal contains ostial 50-70% stenosis.  The left circumflex artery is contains moderate diffuse atherosclerosis. 4 obtuse marginal branches arise from it. The first and second marginal branches are small. The third marginal branch contains proximal segmental  50-70% narrowing the fourth obtuse marginal contains 50-60% narrowing..  The right coronary artery is is dominant and widely patent with the distal segment containing a very focal eccentric 30-50% narrowing. The proximal PDA contains segmental 50-60% narrowing.   LEFT VENTRICULOGRAM:  Left ventricular angiogram was done in the 30 RAO projection and revealed overall normal function with EF of 60%   IMPRESSIONS:  1. Diffuse coronary atherosclerosis involving the circumflex, LAD, and distal right coronary.  2. There is moderate to moderately severe mid LAD/diagonal stenosis, and  moderate OM 3 and OM 4 disease. The proximal PDA contains 40-50% narrowing.  3. Normal left ventricular function   RECOMMENDATION:  Medical therapy with aggressive risk factor modification, addition of Plavix for at least 3 months, and long-acting nitroglycerin in the form of Imdur 30-60 mg daily. If recurrent chest discomfort consider a myocardial perfusion study to rule out apical ischemia as LAD stenosis appears to be the most severe of the diffuse disease that the patient has an all territories. She should have nitroglycerin at discharge.  She would be eligible for discharge midday tomorrow if she is able to ambulate vigorously without recurrent chest pain.[name]

## 2013-07-04 NOTE — Progress Notes (Signed)
[place] / ICU Progress Note  Veronica [name] DJT:[id number] DOB: 1950/04/24 DOA: 07/03/2013 PCP: Gennette Pac, MD  Time spent :  35 mins  Brief narrative: 63 y.o. year old female prior hx/o HTN, DM presented with chest pain. Patient was driving home when she had sudden onset of left-sided chest pain or chest pressure that radiated to the neck and jaw as well as down the arm with associated nausea and diaphoresis. Symptoms persisted and so she presented to the ER.   In the ER she was given sublingual nitroglycerin and morphine with subsequent resolution of pain. The patient was also placed on nitro paste. Patient later developed intermittent episodes of chest pressure and pain that spiked that was relieved again with additional sublingual nitroglycerin. The patient denied any prior history of heart issues in the past. Was seen greater than 10 years ago for chest pain by cardiology. Patient was unsure what the exact workup showed. Chest pressure and pain had been fairly constant does not resolve status post morphine and nitroglycerin.  HPI/Subjective: No current CP although endorsing recurrent dry heaves - no HA from NTG gtt  Assessment/Plan:    Chest pain/Unstable angina -Cards following -cath planned today-sx's concerning for USAP -cont IV Heparin and NTG; ASA and BB -was on statin pre admit -enzymes and EKG have been negative for acute MI     HTN (hypertension) -BP controlled    ? Diabetes mellitus -HgbA1c 5.7 -pt not on meds pre admit -does not actually appear to have DM    Hyperlipidemia -cont statin    Hypothyroidism -cont Synthroid  DVT prophylaxis: IV heparin Code Status: Full Family Communication: Husband at bedside Disposition Plan/Expected LOS: Stepdown  Consultants: Cardiology  Procedures: Cardiac catheterization pending  Antibiotics: None  Objective: Blood pressure 122/68, pulse 72, temperature 98 F (36.7 C), temperature  source Oral, resp. rate 17, height 5\' 4"  (1.626 m), weight 176 lb 12.9 oz (80.2 kg), last menstrual period 02/06/2010, SpO2 99.00%.  Intake/Output Summary (Last 24 hours) at 07/04/13 1338 Last data filed at 07/04/13 0900  Gross per 24 hour  Intake  106.2 ml  Output      0 ml  Net  106.2 ml   Exam: General: No acute respiratory distress Lungs: Clear to auscultation bilaterally without wheezes or crackles, RA Cardiovascular: Regular rate and rhythm without murmur gallop or rub normal S1 and S2, no peripheral edema  Abdomen: Nontender, nondistended, soft, bowel sounds positive, no rebound, no ascites, no appreciable mass Musculoskeletal: No significant cyanosis, clubbing of bilateral lower extremities   Scheduled Meds:  Scheduled Meds: . aspirin EC  81 mg Oral Daily  . atorvastatin  40 mg Oral Daily  . levothyroxine  125 mcg Oral QAC breakfast  . metoprolol tartrate  12.5 mg Oral BID  . multivitamin with minerals  1 tablet Oral Daily  . omega-3 acid ethyl esters  1 g Oral Daily  . sertraline  25 mg Oral Daily  . sodium chloride  3 mL Intravenous Q12H   Continuous Infusions: . heparin 900 Units/hr (07/04/13 0900)  . nitroGLYCERIN 10 mcg/min (07/04/13 0900)    Data Reviewed: Basic Metabolic Panel:  Recent Labs Lab 07/03/13 2131 07/04/13 0016  NA 139 142  K 3.4* 3.7  CL 99 101  CO2 24 26  GLUCOSE 208* 101*  BUN 25* 24*  CREATININE 0.82 0.78  CALCIUM 9.5 9.9   Liver Function Tests:  Recent Labs Lab 07/03/13 2131 07/04/13 0016  AST  27 27  ALT 23 23  ALKPHOS 152* 152*  BILITOT 0.4 0.4  PROT 7.7 7.6  ALBUMIN 4.3 4.2    Recent Labs Lab 07/03/13 2131  LIPASE 27   CBC:  Recent Labs Lab 07/03/13 2131 07/04/13 0016  WBC 5.5 8.7  NEUTROABS 3.2 6.6  HGB 14.3 13.9  HCT 42.5 40.6  MCV 88.9 88.3  PLT PLATELET CLUMPS NOTED ON SMEAR, COUNT APPEARS INCREASED 205   Cardiac Enzymes:  Recent Labs Lab 07/04/13 0016 07/04/13 0324 07/04/13 1140  TROPONINI  <0.30 <0.30 <0.30   BNP (last 3 results)  Recent Labs  07/04/13 0016  PROBNP 25.6   CBG: No results found for this basename: GLUCAP,  in the last 168 hours  Recent Results (from the past 240 hour(s))  MRSA PCR SCREENING     Status: None   Collection Time    07/04/13  1:57 AM      Result Value Ref Range Status   MRSA by PCR NEGATIVE  NEGATIVE Final   Comment:            The GeneXpert MRSA Assay (FDA     approved for NASAL specimens     only), is one component of a     comprehensive MRSA colonization     surveillance program. It is not     intended to diagnose MRSA     infection nor to guide or     monitor treatment for     MRSA infections.     Studies:  Recent x-ray studies have been reviewed in detail by the Attending Physician  Erin Hearing, ANP [facility] Office  [phone] Pager [phone]  **If unable to reach the above provider after paging please contact the [facility] @ [phone]  On-Call/Text Page:      [email or web address]      password [email or web address]  If 7PM-7AM, please contact night-coverage [email or web address] Password [email or web address] 07/04/2013, 1:38 PM   LOS: 1 day   I have personally examined this patient and reviewed the entire database. I have reviewed the above note, made any necessary editorial changes, and agree with its content.  Cherene Altes, MD [facility]

## 2013-07-04 NOTE — Interval H&P Note (Signed)
Cath Lab Visit (complete for each Cath Lab visit)  Clinical Evaluation Leading to the Procedure:   ACS: yes  Non-ACS:    Anginal Classification: CCS III  Anti-ischemic medical therapy: Minimal Therapy (1 class of medications)  Non-Invasive Test Results: No non-invasive testing performed  Prior CABG: No previous CABG      History and Physical Interval Note:  07/04/2013 5:05 PM  Veronica [name]  has presented today for surgery, with the diagnosis of cp  The various methods of treatment have been discussed with the patient and family. After consideration of risks, benefits and other options for treatment, the patient has consented to  Procedure(s): LEFT HEART CATHETERIZATION WITH CORONARY ANGIOGRAM (N/A) as a surgical intervention .  The patient's history has been reviewed, patient examined, no change in status, stable for surgery.  I have reviewed the patient's chart and labs.  Questions were answered to the patient's satisfaction.     Belva Crome III

## 2013-07-04 NOTE — H&P (Signed)
Hospitalist Admission History and Physical  Patient name: Veronica [name] Medical record number: [id number] Date of birth: Jun 07, 1950 Age: 63 y.o. Gender: female  Primary Care Provider: Gennette Pac, MD  Chief Complaint: chest pain  History of Present Illness:This is a 63 y.o. year old female prior hx/o HTN, DM presenting with chest pain. Patient was driving home when she had sudden onset of left-sided chest pain or chest pressure that radiated to the neck and jaw as well as down the arm with associated nausea and diaphoresis. Symptoms persisted and so she presented to the ER. Patient was given sublingual nitroglycerin with him as well as morphine with subsequent resolution of pain. The patient was also placed on nitro paste. Patient does have some intermittent episodes of chest pressure and pain that spikes that was relieved again with additional sublingual nitroglycerin. The patient denies any prior history of heart issues in the past. Was seen greater than 10 years ago for chest pain by cardiology. Patient is unsure what the exact workup showed. Chest pressure and pain has been fairly constant does not resolve status post morphine and nitroglycerin. Cardiovascular risk factors: Hypertension, hyperlipidemia, age, positive family history of multiple heart attacks in both mother and father  Presented to the ER today hemodynamically stable. EKG normal sinus rhythm. Troponin within normal limits x1. Chest x-ray pending. S he within normal limits.  Heart score 5. TIMI score 3  Assessment and Plan: Veronica [name] is a 63 y.o. year old female presenting with chest pain Active Problems:   Chest pain   Chest pain: Fairly typical symptoms the patient with multiple cardiovascular risk factors. We'll place on heparin and nitroglycerin drip. Cardiology consult pending. Cycle cardiac enzymes. Step-down bed. Risk stratification labs including hemoglobin A1c, TSH, lipid panel.  Hypertension: Blood  pressure fairly stable on admission. Hold oral meds in the setting of mitral surgery best to avoid hypotension. Continue to follow. Titrate as clinically indicated. Cardiology consult pending. FEN/GI: N.p.o. Prophylaxis: Heparin Disposition: Pending further evaluation Code Status: Full code   Patient Active Problem List   Diagnosis Date Noted  . Chest pain 07/04/2013  . Cervical radiculopathy 04/04/2011   Past Medical History: Past Medical History  Diagnosis Date  . Complication of anesthesia     extreme sore throat after aneathesia,   also      "woke up screamimg" after surgery  . Hypothyroidism   . Hypertension     stress test 10 yrs ago.   pcp   dr Lennette Bihari little  . Anxiety   . Hyperlipemia   . Breast cyst 3/01    left  . Osteopenia     Past Surgical History: Past Surgical History  Procedure Laterality Date  . Laparoscopic cholecystectomy  4/97  . Wrist fracture surgery    . Dilation and curettage of uterus    . Tonsillectomy    . Posterior cervical fusion/foraminotomy  04/04/2011    Procedure: POSTERIOR CERVICAL FUSION/FORAMINOTOMY LEVEL 1;  Surgeon: Charlie Pitter, MD;  Location: [facility];  Service: Neurosurgery;  Laterality: Left;  Left Cervical Six-Seven Laminectomy, Foraminotomy, Diskectomy     Social History: History   Social History  . Marital Status: Married    Spouse Name: N/A    Number of Children: N/A  . Years of Education: N/A   Social History Main Topics  . Smoking status: Never Smoker   . Smokeless tobacco: Never Used  . Alcohol Use: No  . Drug Use: No  . Sexual Activity:  Yes    Partners: Male   Other Topics Concern  . None   Social History Narrative  . None    Family History: No family history on file.  Allergies: Allergies  Allergen Reactions  . Prilosec [Omeprazole Magnesium] Other (See Comments)    Blisters and skin peels off  . Sulfa Antibiotics Other (See Comments)    Blisters and skin peels off  . Ultram [Tramadol Hcl]      Lapse of memory      Current Facility-Administered Medications  Medication Dose Route Frequency Provider Last Rate Last Dose  . heparin ADULT infusion 100 units/mL (25000 units/250 mL)  900 Units/hr Intravenous Continuous Narda Bonds, RPH      . heparin bolus via infusion 4,000 Units  4,000 Units Intravenous Once Narda Bonds, [facility]      . morphine 2 MG/ML injection 1-2 mg  1-2 mg Intravenous Q2H PRN Shanda Howells, MD      . nitroGLYCERIN (NITROGLYN) 2 % ointment 0.5 inch  0.5 inch Topical 4 times per day Varney Biles, MD   0.5 inch at 07/03/13 2334  . nitroGLYCERIN (NITROSTAT) SL tablet 0.4 mg  0.4 mg Sublingual Q5 min PRN Ankit Nanavati, MD   0.4 mg at 07/03/13 2237  . nitroGLYCERIN 0.2 mg/mL in dextrose 5 % infusion  2-200 mcg/min Intravenous Titrated Shanda Howells, MD      . sodium chloride 0.9 % injection 3 mL  3 mL Intravenous Q12H Shanda Howells, MD       Current Outpatient Prescriptions  Medication Sig Dispense Refill  . ALPRAZolam (XANAX) 0.5 MG tablet Take 0.5 mg by mouth at bedtime as needed. For sleep      . aspirin EC 81 MG tablet Take 81 mg by mouth daily.      [name] atorvastatin (LIPITOR) 40 MG tablet Take 40 mg by mouth daily.      . fish oil-omega-3 fatty acids 1000 MG capsule Take 1 g by mouth daily.      . hydrochlorothiazide (MICROZIDE) 12.5 MG capsule Take 12.5 mg by mouth daily.      . hydrocortisone (ANUSOL-HC) 2.5 % rectal cream Place rectally 2 (two) times daily.  30 g  1  . levothyroxine (SYNTHROID, LEVOTHROID) 125 MCG tablet Take 125 mcg by mouth daily.      . Multiple Vitamin (MULITIVITAMIN WITH MINERALS) TABS Take 1 tablet by mouth daily.      . sertraline (ZOLOFT) 50 MG tablet Take 25 mg by mouth daily.      . valsartan (DIOVAN) 40 MG tablet Take 40 mg by mouth daily.       Review Of Systems: 12 point ROS negative except as noted above in HPI.  Physical Exam: Filed Vitals:   07/03/13 2326  BP: 121/58  Pulse: 76  Temp:   Resp: 16    General: alert  and cooperative HEENT: PERRLA and extra ocular movement intact Heart: S1, S2 normal, no murmur, rub or gallop, regular rate and rhythm Lungs: clear to auscultation, no wheezes or rales and unlabored breathing Abdomen: abdomen is soft without significant tenderness, masses, organomegaly or guarding Extremities: extremities normal, atraumatic, no cyanosis or edema Skin:no rashes, no ecchymoses Neurology: normal without focal findings  Labs and Imaging: Lab Results  Component Value Date/Time   NA 139 07/03/2013  9:31 PM   K 3.4* 07/03/2013  9:31 PM   CL 99 07/03/2013  9:31 PM   CO2 24 07/03/2013  9:31 PM   BUN 25* 07/03/2013  9:31 PM   CREATININE 0.82 07/03/2013  9:31 PM   GLUCOSE 208* 07/03/2013  9:31 PM   Lab Results  Component Value Date   WBC 5.5 07/03/2013   HGB 14.3 07/03/2013   HCT 42.5 07/03/2013   MCV 88.9 07/03/2013   PLT PLATELET CLUMPS NOTED ON SMEAR, COUNT APPEARS INCREASED 07/03/2013    No results found.         Shanda Howells MD  Pager: [phone]

## 2013-07-04 NOTE — H&P (View-Only) (Signed)
TELEMETRY: Reviewed telemetry pt in NSR: Filed Vitals:   07/04/13 0400 07/04/13 0500 07/04/13 0600 07/04/13 0800  BP: 142/75 106/55 101/60 128/61  Pulse: 72 57 59 76  Temp: 98.2 F (36.8 C)   97.7 F (36.5 C)  TempSrc: Oral   Oral  Resp: 15 13 13    Height:      Weight:      SpO2: 100% 99% 100% 100%    Intake/Output Summary (Last 24 hours) at 07/04/13 0852 Last data filed at 07/04/13 0700  Gross per 24 hour  Intake   82.2 ml  Output      0 ml  Net   82.2 ml   Filed Weights   07/03/13 2157 07/03/13 2336 07/04/13 0238  Weight: 177 lb (80.287 kg) 177 lb (80.287 kg) 176 lb 12.9 oz (80.2 kg)    Subjective Feels much better today. No recurrent chest pain on IV Ntg. Presented with sustained left precordial chest pain radiating to left shoulder and jaw. Associated with nausea and vomiting. Relieved with sl Ntg but then pain recurred.   [name] aspirin EC  81 mg Oral Daily  . atorvastatin  40 mg Oral Daily  . levothyroxine  125 mcg Oral QAC breakfast  . multivitamin with minerals  1 tablet Oral Daily  . omega-3 acid ethyl esters  1 g Oral Daily  . sertraline  25 mg Oral Daily  . sodium chloride  3 mL Intravenous Q12H     LABS: Basic Metabolic Panel:  Recent Labs  07/03/13 2131 07/04/13 0016  NA 139 142  K 3.4* 3.7  CL 99 101  CO2 24 26  GLUCOSE 208* 101*  BUN 25* 24*  CREATININE 0.82 0.78  CALCIUM 9.5 9.9   Liver Function Tests:  Recent Labs  07/03/13 2131 07/04/13 0016  AST 27 27  ALT 23 23  ALKPHOS 152* 152*  BILITOT 0.4 0.4  PROT 7.7 7.6  ALBUMIN 4.3 4.2    Recent Labs  07/03/13 2131  LIPASE 27   CBC:  Recent Labs  07/03/13 2131 07/04/13 0016  WBC 5.5 8.7  NEUTROABS 3.2 6.6  HGB 14.3 13.9  HCT 42.5 40.6  MCV 88.9 88.3  PLT PLATELET CLUMPS NOTED ON SMEAR, COUNT APPEARS INCREASED 205   Cardiac Enzymes:  Recent Labs  07/04/13 0016 07/04/13 0324  TROPONINI <0.30 <0.30   BNP:  Recent Labs  07/04/13 0016  PROBNP 25.6    D-Dimer: No results found for this basename: DDIMER,  in the last 72 hours Hemoglobin A1C: No results found for this basename: HGBA1C,  in the last 72 hours Fasting Lipid Panel: No results found for this basename: CHOL, HDL, LDLCALC, TRIG, CHOLHDL, LDLDIRECT,  in the last 72 hours Thyroid Function Tests:  Recent Labs  07/04/13 0016  TSH 1.240     Radiology/Studies:  Dg Chest Portable 1 View  07/04/2013   CLINICAL DATA:  LEFT side chest pressure and shortness of breath for 2 hr, history hypertension  EXAM: PORTABLE CHEST - 1 VIEW  COMPARISON:  Portable exam 2332 hr compared to 03/27/2011  FINDINGS: Upper normal heart size accentuated by hypoinflation.  Mediastinal contours and pulmonary vascularity normal.  Decreased lung volumes with bibasilar atelectasis.  No definite infiltrate, pleural effusion or pneumothorax.  Bones unremarkable.  IMPRESSION: Decreased lung volumes with bibasilar atelectasis.   Electronically Signed   By: Lavonia Dana M.D.   On: 07/04/2013 00:13   Ecg: NSR, normal.  PHYSICAL EXAM General: Well developed, well nourished,  in no acute distress. Head: Normocephalic, atraumatic, sclera non-icteric, oropharynx is clear Neck: Negative for carotid bruits. JVD not elevated. No adenopathy Lungs: Clear bilaterally to auscultation without wheezes, rales, or rhonchi. Breathing is unlabored. Heart: RRR S1 S2 without murmurs, rubs, or gallops.  Abdomen: Soft, non-tender, non-distended with normoactive bowel sounds. No hepatomegaly. No rebound/guarding. No obvious abdominal masses. Msk:  Strength and tone appears normal for age. Extremities: No clubbing, cyanosis or edema.  Distal pedal pulses are 2+ and equal bilaterally. Neuro: Alert and oriented X 3. Moves all extremities spontaneously. Psych:  Responds to questions appropriately with a normal affect.  ASSESSMENT AND PLAN: 1. Unstable angina. History is very convincing. Relief with Ntg. Risk factors of HTN, HL, and  strong family history of CAD. Continue IV Ntg, Heparin. On ASA, statin, metoprolol. Recommend cardiac cath today to define risk and treatment options. The procedure and risks were reviewed including but not limited to death, myocardial infarction, stroke, arrythmias, bleeding, transfusion, emergency surgery, dye allergy, or renal dysfunction. The patient voices understanding and is agreeable to proceed..  2. HTN- controlled.  3. Hyperlipidemia. On statin and fish oil.   4. Family history of CAD  Present on Admission:  . Chest pain . Unstable angina  Signed, Peter M Martin[place], [place] 07/04/2013 8:52 AM

## 2013-07-04 NOTE — Progress Notes (Signed)
[place] for Heparin  Indication: chest pain/ACS  Allergies  Allergen Reactions  . Prilosec [Omeprazole Magnesium] Other (See Comments)    Blisters and skin peels off  . Sulfa Antibiotics Other (See Comments)    Blisters and skin peels off  . Ultram [Tramadol Hcl]     Lapse of memory      Patient Measurements: Height: 5\' 4"  (162.6 cm) Weight: 176 lb 12.9 oz (80.2 kg) IBW/kg (Calculated) : 54.7 Heparin Dosing Weight: ~71kg   Vital Signs: Temp: 98 F (36.7 C) (05/29 1231) Temp src: Oral (05/29 1231) BP: 122/68 mmHg (05/29 1200) Pulse Rate: 72 (05/29 1231)  Labs:  Recent Labs  07/03/13 2131 07/04/13 0016 07/04/13 0324 07/04/13 1140  HGB 14.3 13.9  --   --   HCT 42.5 40.6  --   --   PLT PLATELET CLUMPS NOTED ON SMEAR, COUNT APPEARS INCREASED 205  --   --   LABPROT  --   --   --  13.4  INR  --   --   --  1.04  HEPARINUNFRC  --   --   --  0.69  CREATININE 0.82 0.78  --   --   TROPONINI  --  <0.30 <0.30 <0.30   Estimated Creatinine Clearance: 75.7 ml/min (by C-G formula based on Cr of 0.78).  Medical History: Past Medical History  Diagnosis Date  . Complication of anesthesia     extreme sore throat after aneathesia,   also      "woke up screamimg" after surgery  . Hypothyroidism   . Hypertension     stress test 10 yrs ago.   pcp   dr Lennette Bihari little  . Anxiety   . Hyperlipemia   . Breast cyst 3/01    left  . Osteopenia     Assessment: 63 y/o F to start heparin for CP. Initial heparin level at goal 0.69, for cath this afternoon. No bleeding issues noted, will continue current rate for now and follow up after cath.   Goal of Therapy:  Heparin level 0.3-0.7 units/ml Monitor platelets by anticoagulation protocol: Yes   Plan:  -Continue heparin drip at 900 units/hr -Daily CBC/HL -Monitor for bleeding -Follow up after cath  Veronica [name] 07/04/2013,1:37 PM

## 2013-07-04 NOTE — Progress Notes (Signed)
TELEMETRY: Reviewed telemetry pt in NSR: Filed Vitals:   07/04/13 0400 07/04/13 0500 07/04/13 0600 07/04/13 0800  BP: 142/75 106/55 101/60 128/61  Pulse: 72 57 59 76  Temp: 98.2 F (36.8 C)   97.7 F (36.5 C)  TempSrc: Oral   Oral  Resp: 15 13 13    Height:      Weight:      SpO2: 100% 99% 100% 100%    Intake/Output Summary (Last 24 hours) at 07/04/13 0852 Last data filed at 07/04/13 0700  Gross per 24 hour  Intake   82.2 ml  Output      0 ml  Net   82.2 ml   Filed Weights   07/03/13 2157 07/03/13 2336 07/04/13 0238  Weight: 177 lb (80.287 kg) 177 lb (80.287 kg) 176 lb 12.9 oz (80.2 kg)    Subjective Feels much better today. No recurrent chest pain on IV Ntg. Presented with sustained left precordial chest pain radiating to left shoulder and jaw. Associated with nausea and vomiting. Relieved with sl Ntg but then pain recurred.   [name] aspirin EC  81 mg Oral Daily  . atorvastatin  40 mg Oral Daily  . levothyroxine  125 mcg Oral QAC breakfast  . multivitamin with minerals  1 tablet Oral Daily  . omega-3 acid ethyl esters  1 g Oral Daily  . sertraline  25 mg Oral Daily  . sodium chloride  3 mL Intravenous Q12H     LABS: Basic Metabolic Panel:  Recent Labs  07/03/13 2131 07/04/13 0016  NA 139 142  K 3.4* 3.7  CL 99 101  CO2 24 26  GLUCOSE 208* 101*  BUN 25* 24*  CREATININE 0.82 0.78  CALCIUM 9.5 9.9   Liver Function Tests:  Recent Labs  07/03/13 2131 07/04/13 0016  AST 27 27  ALT 23 23  ALKPHOS 152* 152*  BILITOT 0.4 0.4  PROT 7.7 7.6  ALBUMIN 4.3 4.2    Recent Labs  07/03/13 2131  LIPASE 27   CBC:  Recent Labs  07/03/13 2131 07/04/13 0016  WBC 5.5 8.7  NEUTROABS 3.2 6.6  HGB 14.3 13.9  HCT 42.5 40.6  MCV 88.9 88.3  PLT PLATELET CLUMPS NOTED ON SMEAR, COUNT APPEARS INCREASED 205   Cardiac Enzymes:  Recent Labs  07/04/13 0016 07/04/13 0324  TROPONINI <0.30 <0.30   BNP:  Recent Labs  07/04/13 0016  PROBNP 25.6    D-Dimer: No results found for this basename: DDIMER,  in the last 72 hours Hemoglobin A1C: No results found for this basename: HGBA1C,  in the last 72 hours Fasting Lipid Panel: No results found for this basename: CHOL, HDL, LDLCALC, TRIG, CHOLHDL, LDLDIRECT,  in the last 72 hours Thyroid Function Tests:  Recent Labs  07/04/13 0016  TSH 1.240     Radiology/Studies:  Dg Chest Portable 1 View  07/04/2013   CLINICAL DATA:  LEFT side chest pressure and shortness of breath for 2 hr, history hypertension  EXAM: PORTABLE CHEST - 1 VIEW  COMPARISON:  Portable exam 2332 hr compared to 03/27/2011  FINDINGS: Upper normal heart size accentuated by hypoinflation.  Mediastinal contours and pulmonary vascularity normal.  Decreased lung volumes with bibasilar atelectasis.  No definite infiltrate, pleural effusion or pneumothorax.  Bones unremarkable.  IMPRESSION: Decreased lung volumes with bibasilar atelectasis.   Electronically Signed   By: Lavonia Dana M.D.   On: 07/04/2013 00:13   Ecg: NSR, normal.  PHYSICAL EXAM General: Well developed, well nourished,  in no acute distress. Head: Normocephalic, atraumatic, sclera non-icteric, oropharynx is clear Neck: Negative for carotid bruits. JVD not elevated. No adenopathy Lungs: Clear bilaterally to auscultation without wheezes, rales, or rhonchi. Breathing is unlabored. Heart: RRR S1 S2 without murmurs, rubs, or gallops.  Abdomen: Soft, non-tender, non-distended with normoactive bowel sounds. No hepatomegaly. No rebound/guarding. No obvious abdominal masses. Msk:  Strength and tone appears normal for age. Extremities: No clubbing, cyanosis or edema.  Distal pedal pulses are 2+ and equal bilaterally. Neuro: Alert and oriented X 3. Moves all extremities spontaneously. Psych:  Responds to questions appropriately with a normal affect.  ASSESSMENT AND PLAN: 1. Unstable angina. History is very convincing. Relief with Ntg. Risk factors of HTN, HL, and  strong family history of CAD. Continue IV Ntg, Heparin. On ASA, statin, metoprolol. Recommend cardiac cath today to define risk and treatment options. The procedure and risks were reviewed including but not limited to death, myocardial infarction, stroke, arrythmias, bleeding, transfusion, emergency surgery, dye allergy, or renal dysfunction. The patient voices understanding and is agreeable to proceed..  2. HTN- controlled.  3. Hyperlipidemia. On statin and fish oil.   4. Family history of CAD  Present on Admission:  . Chest pain . Unstable angina  Signed, Peter M Martin[place], [place] 07/04/2013 8:52 AM

## 2013-07-05 ENCOUNTER — Encounter (HOSPITAL_COMMUNITY): Payer: Self-pay | Admitting: Emergency Medicine

## 2013-07-05 ENCOUNTER — Emergency Department (HOSPITAL_COMMUNITY)
Admission: EM | Admit: 2013-07-05 | Discharge: 2013-07-05 | Disposition: A | Discharge status: Home / Self Care | Payer: 59 | Attending: Emergency Medicine | Admitting: Emergency Medicine

## 2013-07-05 ENCOUNTER — Telehealth: Payer: Self-pay | Admitting: Physician Assistant

## 2013-07-05 DIAGNOSIS — E785 Hyperlipidemia, unspecified: Secondary | ICD-10-CM | POA: Insufficient documentation

## 2013-07-05 DIAGNOSIS — Z7982 Long term (current) use of aspirin: Secondary | ICD-10-CM | POA: Insufficient documentation

## 2013-07-05 DIAGNOSIS — M79609 Pain in unspecified limb: Secondary | ICD-10-CM

## 2013-07-05 DIAGNOSIS — I2510 Atherosclerotic heart disease of native coronary artery without angina pectoris: Secondary | ICD-10-CM | POA: Insufficient documentation

## 2013-07-05 DIAGNOSIS — R509 Fever, unspecified: Secondary | ICD-10-CM | POA: Insufficient documentation

## 2013-07-05 DIAGNOSIS — E039 Hypothyroidism, unspecified: Secondary | ICD-10-CM | POA: Insufficient documentation

## 2013-07-05 DIAGNOSIS — Z8742 Personal history of other diseases of the female genital tract: Secondary | ICD-10-CM | POA: Insufficient documentation

## 2013-07-05 DIAGNOSIS — M79669 Pain in unspecified lower leg: Secondary | ICD-10-CM

## 2013-07-05 DIAGNOSIS — I200 Unstable angina: Secondary | ICD-10-CM | POA: Insufficient documentation

## 2013-07-05 DIAGNOSIS — Z7902 Long term (current) use of antithrombotics/antiplatelets: Secondary | ICD-10-CM | POA: Insufficient documentation

## 2013-07-05 DIAGNOSIS — I10 Essential (primary) hypertension: Secondary | ICD-10-CM | POA: Insufficient documentation

## 2013-07-05 DIAGNOSIS — Z79899 Other long term (current) drug therapy: Secondary | ICD-10-CM | POA: Insufficient documentation

## 2013-07-05 DIAGNOSIS — F411 Generalized anxiety disorder: Secondary | ICD-10-CM | POA: Insufficient documentation

## 2013-07-05 LAB — BASIC METABOLIC PANEL
BUN: 20 mg/dL (ref 6–23)
CHLORIDE: 102 meq/L (ref 96–112)
CO2: 24 meq/L (ref 19–32)
CREATININE: 0.8 mg/dL (ref 0.50–1.10)
Calcium: 9.2 mg/dL (ref 8.4–10.5)
GFR calc Af Amer: >90 mL/min (ref >90)
GFR calc non Af Amer: 78 mL/min — ABNORMAL LOW (ref >90)
Glucose, Bld: 128 mg/dL — ABNORMAL HIGH (ref 70–99)
POTASSIUM: 3.6 meq/L — AB (ref 3.7–5.3)
Sodium: 141 mEq/L (ref 137–147)

## 2013-07-05 LAB — CBC WITH DIFFERENTIAL/PLATELET
Basophils Absolute: 0 10*3/uL (ref 0.0–0.1)
Basophils Relative: 0 % (ref 0–1)
Eosinophils Absolute: 0.1 10*3/uL (ref 0.0–0.7)
Eosinophils Relative: 2 % (ref 0–5)
HEMATOCRIT: 38 % (ref 36.0–46.0)
HEMOGLOBIN: 12.7 g/dL (ref 12.0–15.0)
LYMPHS PCT: 27 % (ref 12–46)
Lymphs Abs: 1.6 10*3/uL (ref 0.7–4.0)
MCH: 29.8 pg (ref 26.0–34.0)
MCHC: 33.4 g/dL (ref 30.0–36.0)
MCV: 89.2 fL (ref 78.0–100.0)
MONO ABS: 0.3 10*3/uL (ref 0.1–1.0)
MONOS PCT: 4 % (ref 3–12)
NEUTROS ABS: 3.8 10*3/uL (ref 1.7–7.7)
NEUTROS PCT: 67 % (ref 43–77)
Platelets: 183 10*3/uL (ref 150–400)
RBC: 4.26 MIL/uL (ref 3.87–5.11)
RDW: 13.9 % (ref 11.5–15.5)
WBC: 5.7 10*3/uL (ref 4.0–10.5)

## 2013-07-05 MED ORDER — METOPROLOL TARTRATE 25 MG PO TABS: 12.5000 mg | ORAL_TABLET | Freq: Two times a day (BID) | ORAL | Status: DC

## 2013-07-05 MED ORDER — POTASSIUM CHLORIDE CRYS ER 20 MEQ PO TBCR
40.0000 meq | EXTENDED_RELEASE_TABLET | Freq: Once | ORAL | Status: AC
  Administered 2013-07-05: 40 meq via ORAL
  Filled 2013-07-05: qty 2

## 2013-07-05 MED ORDER — ISOSORBIDE MONONITRATE ER 30 MG PO TB24: 30.0000 mg | ORAL_TABLET | Freq: Every day | ORAL | Status: DC

## 2013-07-05 MED ORDER — ACETAMINOPHEN 325 MG PO TABS
650.0000 mg | ORAL_TABLET | Freq: Four times a day (QID) | ORAL | Status: DC | PRN
  Administered 2013-07-05: 650 mg via ORAL

## 2013-07-05 MED ORDER — CLOPIDOGREL BISULFATE 75 MG PO TABS: 75.0000 mg | ORAL_TABLET | Freq: Every day | ORAL | Status: DC

## 2013-07-05 MED ORDER — ACETAMINOPHEN 325 MG PO TABS: 650.0000 mg | ORAL_TABLET | Freq: Four times a day (QID) | ORAL | Status: DC | PRN

## 2013-07-05 MED ORDER — NITROGLYCERIN 0.4 MG SL SUBL: 0.4000 mg | SUBLINGUAL_TABLET | SUBLINGUAL | Status: DC | PRN

## 2013-07-05 NOTE — ED Notes (Signed)
The pain is laterally

## 2013-07-05 NOTE — ED Notes (Signed)
Vascular at bedside

## 2013-07-05 NOTE — Discharge Summary (Signed)
DISCHARGE SUMMARY  Veronica [name]  MR#: [id number]  DOB:1950-10-31  Date of Admission: 07/03/2013 Date of Discharge: 07/05/2013  Attending Physician:Demerius Podolak T Journey [place]  Patient's EXH:BZJIRC,VELFY Marigene Ehlers, MD  Consults: [facility] Cardiology  Disposition: cleared for d/c home per Cardiology   Follow-up Appts:     Follow-up Information   Follow up with Sinclair Grooms, MD. (our office will call you with date and time)    Specialty:  Cardiology   Contact information:   [address] [place] [address] [phone]       Follow up with Gennette Pac, MD. Schedule an appointment as soon as possible for a visit in 1 week.   Specialty:  Family Medicine   Contact information:   [place] [address] [phone]      Discharge Diagnoses: Unstable angina  Non-obstructive but diffuse CAD  Dyslipidemia HTN Hyperlipidemia Hypothyroidism   Initial presentation: 63 y.o. year old female prior hx/o HTN, HLD, and hypothyroidism who presented with chest pain. Patient was driving home when she had sudden onset of left-sided chest pain or chest pressure that radiated to the neck and jaw as well as down the arm with associated nausea and diaphoresis. Symptoms persisted and so she presented to the ER.   In the ER she was given sublingual nitroglycerin and morphine with subsequent resolution of pain. The patient was also placed on nitro paste. Patient later developed intermittent episodes of chest pressure and pain that spiked that was relieved again with additional sublingual nitroglycerin. The patient denied any prior history of heart issues in the past. Was seen greater than 10 years ago for chest pain by Cardiology. Patient was unsure what the exact workup showed. Chest pressure and pain had been fairly constant does not resolve status post morphine and nitroglycerin.  Hospital Course: Pt ws admitted to the acute units, and Cardiology was  consulted.  All active issue were addressed by Cardiology during this admission.    The pt was taken to the cardiac cath lab on 5/29 with results as follows: 1. Diffuse coronary atherosclerosis involving the circumflex, LAD, and distal right coronary.  2. There is moderate to moderately severe mid LAD/diagonal stenosis, and moderate OM 3 and OM 4 disease. The proximal PDA contains 40-50% narrowing.  3. Normal left ventricular function  Cardiology prescribed the following tx course: Medical therapy with aggressive risk factor modification, addition of Plavix for at least 3 months, and long-acting nitroglycerin in the form of Imdur 30-60 mg daily. If recurrent chest discomfort consider a myocardial perfusion study to rule out apical ischemia as LAD stenosis appears to be the most severe of the diffuse disease that the patient has an all territories. She should have nitroglycerin at discharge.   On 5/30 the patient rounded on by that attending Cardiologist, and cleared for d/c home.  There were no other outstanding medical issues, and therefore the pt was d/c w/o having to wait to see the Hospitalist.  All active issue were addressed by the Cardiology service, who will also arrange for her f/u.    Medication List    STOP taking these medications       hydrochlorothiazide 12.5 MG capsule  Commonly known as:  MICROZIDE     hydrocortisone 2.5 % rectal cream  Commonly known as:  ANUSOL-HC     valsartan 40 MG tablet  Commonly known as:  DIOVAN      TAKE these medications       ALPRAZolam  0.5 MG tablet  Commonly known as:  XANAX  Take 0.5 mg by mouth at bedtime as needed. For sleep     aspirin EC 81 MG tablet  Take 81 mg by mouth daily.     atorvastatin 40 MG tablet  Commonly known as:  LIPITOR  Take 40 mg by mouth daily.     clopidogrel 75 MG tablet  Commonly known as:  PLAVIX  Take 1 tablet (75 mg total) by mouth daily with breakfast.     fish oil-omega-3 fatty acids 1000 MG  capsule  Take 1 g by mouth daily.     isosorbide mononitrate 30 MG 24 hr tablet  Commonly known as:  IMDUR  Take 1 tablet (30 mg total) by mouth daily.     levothyroxine 125 MCG tablet  Commonly known as:  SYNTHROID, LEVOTHROID  Take 125 mcg by mouth daily.     metoprolol tartrate 25 MG tablet  Commonly known as:  LOPRESSOR  Take 0.5 tablets (12.5 mg total) by mouth 2 (two) times daily.     multivitamin with minerals Tabs tablet  Take 1 tablet by mouth daily.     nitroGLYCERIN 0.4 MG SL tablet  Commonly known as:  NITROSTAT  Place 1 tablet (0.4 mg total) under the tongue every 5 (five) minutes as needed for chest pain.     sertraline 50 MG tablet  Commonly known as:  ZOLOFT  Take 25 mg by mouth daily.       Day of Discharge BP 99/58  Pulse 70  Temp(Src) 98 F (36.7 C) (Oral)  Resp 16  Ht 5\' 4"  (1.626 m)  Wt 81.2 kg (179 lb 0.2 oz)  BMI 30.71 kg/m2  SpO2 97%  LMP 02/06/2010  Physical Exam: As per Cardiology service progress note   Results for orders placed during the hospital encounter of 07/03/13 (from the past 24 hour(s))  TROPONIN I     Status: None   Collection Time    07/04/13 11:40 AM      Result Value Ref Range   Troponin I <0.30  <0.30 ng/mL  PROTIME-INR     Status: None   Collection Time    07/04/13 11:40 AM      Result Value Ref Range   Prothrombin Time 13.4  11.6 - 15.2 seconds   INR 1.04  0.00 - 1.49  HEPARIN LEVEL (UNFRACTIONATED)     Status: None   Collection Time    07/04/13 11:40 AM      Result Value Ref Range   Heparin Unfractionated 0.69  0.30 - 0.70 IU/mL  CBC     Status: Abnormal   Collection Time    07/04/13 11:08 PM      Result Value Ref Range   WBC 6.0  4.0 - 10.5 K/uL   RBC 4.00  3.87 - 5.11 MIL/uL   Hemoglobin 11.9 (*) 12.0 - 15.0 g/dL   HCT 36.1  36.0 - 46.0 %   MCV 90.3  78.0 - 100.0 fL   MCH 29.8  26.0 - 34.0 pg   MCHC 33.0  30.0 - 36.0 g/dL   RDW 13.8  11.5 - 15.5 %   Platelets 185  150 - 400 K/uL  CREATININE, SERUM      Status: Abnormal   Collection Time    07/04/13 11:08 PM      Result Value Ref Range   Creatinine, Ser 1.00  0.50 - 1.10 mg/dL   GFR calc non Af Amer 60 (*) >  90 mL/min   GFR calc Af Amer 69 (*) >90 mL/min    Time spent in discharge (includes decision making & examination of pt): 30 minutes  07/05/2013, 10:35 AM   Cherene Altes, MD [facility] Office  [phone] Pager [phone]  On-Call/Text Page:      [email or web address]      password [facility]

## 2013-07-05 NOTE — Progress Notes (Signed)
*  Preliminary Results* Left lower extremity venous duplex completed. Left lower extremity is negative for deep vein thrombosis. There is no evidence of left Baker's cyst. For patient peace of mind, bilateral posterior tibial arteries were evaluated and were found to be patent with triphasic waveforms.  Preliminary results discussed with Clayton Bibles, [place].  07/05/2013 6:42 PM  Maudry Mayhew, RVT, RDCS, RDMS

## 2013-07-05 NOTE — ED Provider Notes (Signed)
CSN: [id number]     Arrival date & time 07/05/13  1732 History   First MD Initiated Contact with Patient 07/05/13 1750     Chief Complaint  Patient presents with  . Leg Pain     (Consider location/radiation/quality/duration/timing/severity/associated sxs/prior Treatment) The history is provided by the patient and medical records.    Pt with left leg pain that began at 11am this morning right before she was discharged from the hospital (admitted for unstable angina, diffuse nonobstructive CAD).  States she developed pain in her left medial shin, she placed a penmark where the pain was located as it was very localized pain, over several hours the pain began to spread down her leg.  The pain is described as burning, 4/10 at rest and 5/10 with walking.  Associated subjective fevers and chills, though pt took her temperature and found it to be 97.  Took 1/2 hydrocodone without relief. Has had mild chest discomfort off and on, at random, but notes it is much more mild than when she was admitted to the hospital 2 days ago and does not radiate.  Has also had N/V since admission to the hospital, notes it is better now than previously. Denies SOB, cough.   Denies leg swelling.  Has started Imdur and plavix this week.    Past Medical History  Diagnosis Date  . Complication of anesthesia     extreme sore throat after aneathesia,   also      "woke up screamimg" after surgery  . Hypothyroidism   . Hypertension     stress test 10 yrs ago.   pcp   dr Lennette Bihari little  . Anxiety   . Hyperlipemia   . Breast cyst 3/01    left  . Osteopenia    Past Surgical History  Procedure Laterality Date  . Laparoscopic cholecystectomy  4/97  . Wrist fracture surgery    . Dilation and curettage of uterus    . Tonsillectomy    . Posterior cervical fusion/foraminotomy  04/04/2011    Procedure: POSTERIOR CERVICAL FUSION/FORAMINOTOMY LEVEL 1;  Surgeon: Charlie Pitter, MD;  Location: [place] [facility];  Service: Neurosurgery;   Laterality: Left;  Left Cervical Six-Seven Laminectomy, Foraminotomy, Diskectomy    No family history on file. History  Substance Use Topics  . Smoking status: Never Smoker   . Smokeless tobacco: Never Used  . Alcohol Use: No   OB History   Grav Para Term Preterm Abortions TAB SAB Ect Mult Living   2 2        2      Review of Systems  All other systems reviewed and are negative.     Allergies  Ace inhibitors; Prilosec; Sulfa antibiotics; and Ultram  Home Medications   Prior to Admission medications   Medication Sig Start Date End Date Taking? Authorizing Provider  ALPRAZolam Duanne Moron) 0.5 MG tablet Take 0.5 mg by mouth at bedtime as needed. For sleep    Historical Provider, MD  aspirin EC 81 MG tablet Take 81 mg by mouth daily.    Historical Provider, MD  atorvastatin (LIPITOR) 40 MG tablet Take 40 mg by mouth daily.    Historical Provider, MD  clopidogrel (PLAVIX) 75 MG tablet Take 1 tablet (75 mg total) by mouth daily with breakfast. 07/05/13   Cherene Altes, MD  fish oil-omega-3 fatty acids 1000 MG capsule Take 1 g by mouth daily.    Historical Provider, MD  isosorbide mononitrate (IMDUR) 30 MG 24 hr tablet  Take 1 tablet (30 mg total) by mouth daily. 07/05/13   Cherene Altes, MD  levothyroxine (SYNTHROID, LEVOTHROID) 125 MCG tablet Take 125 mcg by mouth daily.    Historical Provider, MD  metoprolol tartrate (LOPRESSOR) 25 MG tablet Take 0.5 tablets (12.5 mg total) by mouth 2 (two) times daily. 07/05/13   Cherene Altes, MD  Multiple Vitamin (MULITIVITAMIN WITH MINERALS) TABS Take 1 tablet by mouth daily.    Historical Provider, MD  nitroGLYCERIN (NITROSTAT) 0.4 MG SL tablet Place 1 tablet (0.4 mg total) under the tongue every 5 (five) minutes as needed for chest pain. 07/05/13   Cecilie Kicks, NP  sertraline (ZOLOFT) 50 MG tablet Take 25 mg by mouth daily.    Historical Provider, MD   BP 135/60  Pulse 95  Temp(Src) 99 F (37.2 C) (Oral)  Resp 18  SpO2 98%  LMP  02/06/2010 Physical Exam  Nursing note and vitals reviewed. Constitutional: She appears well-developed and well-nourished. No distress.  HENT:  Head: Normocephalic and atraumatic.  Neck: Neck supple.  Cardiovascular: Normal rate, regular rhythm and intact distal pulses.   Pulmonary/Chest: Effort normal and breath sounds normal. No respiratory distress. She has no wheezes. She has no rales.  Abdominal: Soft. She exhibits no distension. There is no tenderness. There is no rebound and no guarding.  Musculoskeletal: She exhibits no edema.       Legs: Very mild medial lower leg tenderness.  No erythema, edema, warmth, skin changes.  Distal pulses, sensation intact.  Pt does have a few insect bites on the anterior left lower leg with no signs of superinfection.    Neurological: She is alert.  Skin: She is not diaphoretic.    ED Course  Procedures (including critical care time) Labs Review Labs Reviewed  BASIC METABOLIC PANEL - Abnormal; Notable for the following:    Potassium 3.6 (*)    Glucose, Bld 128 (*)    GFR calc non Af Amer 78 (*)    All other components within normal limits  CBC WITH DIFFERENTIAL    Imaging Review Dg Chest Portable 1 View  07/04/2013   CLINICAL DATA:  LEFT side chest pressure and shortness of breath for 2 hr, history hypertension  EXAM: PORTABLE CHEST - 1 VIEW  COMPARISON:  Portable exam 2332 hr compared to 03/27/2011  FINDINGS: Upper normal heart size accentuated by hypoinflation.  Mediastinal contours and pulmonary vascularity normal.  Decreased lung volumes with bibasilar atelectasis.  No definite infiltrate, pleural effusion or pneumothorax.  Bones unremarkable.  IMPRESSION: Decreased lung volumes with bibasilar atelectasis.   Electronically Signed   By: Lavonia Dana M.D.   On: 07/04/2013 00:13     EKG Interpretation None      Per vascular tech, no DVT, arteries look pristine.   7:14 PM Discussed pt with Dr Rogene Houston.   MDM   Final diagnoses:  Lower  leg pain    Pt with left medial lower leg pain that began a few hours ago, has recently been hospitalized for unstable angina.  She does not clinically have DVT and doppler US is negative.  Has strong distal pulses and per vascular tech arteries are normal appearing.  Labs unremarkable except for mild hypokalemia.  No change in chest symptoms since discharged from hospital.  D/C home with PCP, cardiology follow up.  Discussed result, findings, treatment, and follow up  with patient.  Pt given return precautions.  Pt verbalizes understanding and agrees with plan.  Clayton Bibles, PA-C 07/05/13 2238

## 2013-07-05 NOTE — Discharge Instructions (Signed)
Read the information below.  You may return to the Emergency Department at any time for worsening condition or any new symptoms that concern you.  If you develop redness, swelling, uncontrolled pain, or fevers greater than 100.4, return to the ER immediately for a recheck.  Please follow the instructions and return precautions given to you upon discharge today by your cardiologist.

## 2013-07-05 NOTE — ED Notes (Signed)
Pt c/o sharpe pain in left lower leg.  No redness, swelling  Noted at this time.  Pt does have several bug bites to left lower leg that she says has been there for several days.  Pt was d'cd from hosp this am after having a cardiac cath yesterday (right wrist).  Pt was receiving heparin while in hosp and is now on Plavix and ASA.

## 2013-07-05 NOTE — ED Notes (Signed)
The pt is c/o of lt lower leg pain and swelling since this am with chills.  She had a cardiac cath through her rt wrist.  She was discharged this am from here

## 2013-07-05 NOTE — Discharge Instructions (Signed)
Take 1 NTG, under your tongue, while sitting.  If no relief of pain may repeat NTG, one tab every 5 minutes up to 3 tablets total over 15 minutes.  If no relief CALL 911.  If you have dizziness/lightheadness  while taking NTG, stop taking and call 911.        PLEASE NOTE:  We are asking you to stop taking HCTZ and Diovan (both BP medications) until otherwise directed by your Primary Care MD or your Cardiologist - we have initiated 2 new medications (metoprolol and imdur) that can affect your BP and so we need to stop the HCTZ and the Diovan so as to avoid low blood pressure   Angina Pectoris Angina pectoris, often just called angina, is extreme discomfort in your chest, neck, or arm caused by a lack of blood in the middle and thickest layer of your heart wall (myocardium). It may feel like tightness or heavy pressure. It may feel like a crushing or squeezing pain. Some people say it feels like gas or indigestion. It may go down your shoulders, back, and arms. Some people may have symptoms other than pain. These symptoms include fatigue, shortness of breath, cold sweats, or nausea. There are four different types of angina:  Stable angina Stable angina usually occurs in episodes of predictable frequency and duration. It usually is brought on by physical activity, emotional stress, or excitement. These are all times when the myocardium needs more oxygen. Stable angina usually lasts a few minutes and often is relieved by taking a medicine that can be taken under your tongue (sublingually). The medicine is called nitroglycerin. Stable angina is caused by a buildup of plaque inside the arteries, which restricts blood flow to the heart muscle (atherosclerosis).  Unstable angina Unstable angina can occur even when your body experiences little or no physical exertion. It can occur during sleep. It can also occur at rest. It can suddenly increase in severity or frequency. It might not be relieved by sublingual  nitroglycerin. It can last up to 30 minutes. The most common cause of unstable angina is a blood clot that has developed on the top of plaque buildup inside a coronary artery. It can lead to a heart attack if the blood clot completely blocks the artery.  Microvascular angina This type of angina is caused by a disorder of tiny blood vessels called arterioles. Microvascular angina is more common in women. The pain may be more severe and last longer than other types of angina pectoris.  Prinzmetal or variant angina This type of angina pectoris usually occurs when your body experiences little or no physical exertion. It especially occurs in the early morning hours. It is caused by a spasm of your coronary artery. HOME CARE INSTRUCTIONS   Only take over-the-counter and prescription medicines as directed by your caregiver.  Stay active or increase your exercise as directed by your caregiver.  Limit strenuous activity as directed by your caregiver.  Limit heavy lifting as directed by your caregiver.  Maintain a healthy weight.  Learn about and eat heart-healthy foods.  Do not smoke. SEEK IMMEDIATE MEDICAL CARE IF:  You experience the following symptoms:  Chest, neck, deep shoulder, or arm pain or discomfort that lasts more than a few minutes.  Chest, neck, deep shoulder, or arm pain or discomfort that goes away and comes back, repeatedly.  Heavy sweating with discomfort, without a noticeable cause.  Shortness of breath or difficulty breathing.  Angina that does not get better after  a few minutes of rest or after taking sublingual nitroglycerin. These can all be symptoms of a heart attack, which is a medical emergency! Get medical help at once. Call your local emergency service (911 in [place] immediately. Do not  drive yourself to the hospital and do not  wait to for your symptoms to go away. MAKE SURE YOU:  Understand these instructions.  Will watch your condition.  Will get help right  away if you are not doing well or get worse. Document Released: 01/23/2005 Document Revised: 01/10/2012 Document Reviewed: 11/02/2011 [facility] Patient Information 2014 [place], [place].

## 2013-07-05 NOTE — Telephone Encounter (Signed)
    Patient discharged today after a [facility] with non-obstructive CAD. She has an area on her left lower leg of pain on the side. She noticed it around 11am and it has been getting progressively worse. Now she has pain shooting down into her left foot worse with movement. I told her I thought she should come in to have it evaluated. She verbalized understanding.   Perry Mount PA-C  [facility]

## 2013-07-05 NOTE — Progress Notes (Signed)
Patient ID: Veronica [name], female   DOB: 02-18-1950, 63 y.o.   MRN: [id number]   Patient Name: Veronica [name] Date of Encounter: 07/05/2013     Active Problems:   Chest pain   Unstable angina   HTN (hypertension)   Hyperlipidemia   Diabetes mellitus, type 2   Hypothyroidism    SUBJECTIVE Had a bit of chest pressure with exertion,resolved.  CURRENT MEDS . aspirin EC  81 mg Oral Daily  . atorvastatin  40 mg Oral Daily  . clopidogrel  75 mg Oral Q breakfast  . heparin  5,000 Units Subcutaneous 3 times per day  . isosorbide mononitrate  30 mg Oral Daily  . levothyroxine  125 mcg Oral QAC breakfast  . metoprolol tartrate  12.5 mg Oral BID  . multivitamin with minerals  1 tablet Oral Daily  . omega-3 acid ethyl esters  1 g Oral Daily  . sertraline  25 mg Oral Daily  . sodium chloride  3 mL Intravenous Q12H  . sodium chloride  3 mL Intravenous Q12H    OBJECTIVE  Filed Vitals:   07/04/13 2028 07/05/13 0019 07/05/13 0421 07/05/13 0500  BP: 112/63 103/51 97/56   Pulse: 66 64 74   Temp: 97.9 F (36.6 C) 97.3 F (36.3 C) 97.6 F (36.4 C)   TempSrc: Oral Oral Oral   Resp: 17 17 17    Height:      Weight:  179 lb 0.2 oz (81.2 kg)  179 lb 0.2 oz (81.2 kg)  SpO2: 97% 96% 96%     Intake/Output Summary (Last 24 hours) at 07/05/13 0749 Last data filed at 07/04/13 2000  Gross per 24 hour  Intake  104.2 ml  Output      0 ml  Net  104.2 ml   Filed Weights   07/04/13 0238 07/05/13 0019 07/05/13 0500  Weight: 176 lb 12.9 oz (80.2 kg) 179 lb 0.2 oz (81.2 kg) 179 lb 0.2 oz (81.2 kg)    PHYSICAL EXAM  General: Pleasant, middle aged woman, NAD. Neuro: Alert and oriented X 3. Moves all extremities spontaneously. Psych: Normal affect. HEENT:  Normal  Neck: Supple without bruits or JVD. Lungs:  Resp regular and unlabored, CTA. Heart: RRR no s3, s4, or murmurs. Abdomen: Soft, non-tender, non-distended, BS + x 4.  Extremities: No clubbing, cyanosis or edema. DP/PT/Radials 2+ and  equal bilaterally. Right wrist with no hematoma.  Accessory Clinical Findings  CBC  Recent Labs  07/03/13 2131 07/04/13 0016 07/04/13 2308  WBC 5.5 8.7 6.0  NEUTROABS 3.2 6.6  --   HGB 14.3 13.9 11.9*  HCT 42.5 40.6 36.1  MCV 88.9 88.3 90.3  PLT PLATELET CLUMPS NOTED ON SMEAR, COUNT APPEARS INCREASED 205 144   Basic Metabolic Panel  Recent Labs  07/03/13 2131 07/04/13 0016 07/04/13 2308  NA 139 142  --   K 3.4* 3.7  --   CL 99 101  --   CO2 24 26  --   GLUCOSE 208* 101*  --   BUN 25* 24*  --   CREATININE 0.82 0.78 1.00  CALCIUM 9.5 9.9  --    Liver Function Tests  Recent Labs  07/03/13 2131 07/04/13 0016  AST 27 27  ALT 23 23  ALKPHOS 152* 152*  BILITOT 0.4 0.4  PROT 7.7 7.6  ALBUMIN 4.3 4.2    Recent Labs  07/03/13 2131  LIPASE 27   Cardiac Enzymes  Recent Labs  07/04/13 0016 07/04/13 0324 07/04/13 1140  TROPONINI <0.30 <0.30 <0.30   BNP No components found with this basename: POCBNP,  D-Dimer No results found for this basename: DDIMER,  in the last 72 hours Hemoglobin A1C  Recent Labs  07/04/13 0016  HGBA1C 5.7*   Fasting Lipid Panel No results found for this basename: CHOL, HDL, LDLCALC, TRIG, CHOLHDL, LDLDIRECT,  in the last 72 hours Thyroid Function Tests  Recent Labs  07/04/13 0016  TSH 1.240    TELE  [facility]  Radiology/Studies  Dg Chest Portable 1 View  07/04/2013   CLINICAL DATA:  LEFT side chest pressure and shortness of breath for 2 hr, history hypertension  EXAM: PORTABLE CHEST - 1 VIEW  COMPARISON:  Portable exam 2332 hr compared to 03/27/2011  FINDINGS: Upper normal heart size accentuated by hypoinflation.  Mediastinal contours and pulmonary vascularity normal.  Decreased lung volumes with bibasilar atelectasis.  No definite infiltrate, pleural effusion or pneumothorax.  Bones unremarkable.  IMPRESSION: Decreased lung volumes with bibasilar atelectasis.   Electronically Signed   By: Lavonia Dana M.D.   On: 07/04/2013  00:13    ASSESSMENT AND PLAN 1. Unstable angina 2. Non-obstructive but diffuse CAD 3. Dyslipidemia Rec: ok for discharge on her current meds including long acting nitrates and plavix. She has seen Dr. Radford Pax in the past. She can followup with her.  Iyania Denne,M.D.  Meaghen Vecchiarelli,M.D.  07/05/2013 7:49 AM

## 2013-07-07 ENCOUNTER — Telehealth: Payer: Self-pay | Admitting: Interventional Cardiology

## 2013-07-07 NOTE — Telephone Encounter (Signed)
New message          Pt had Cath done by Dr Tamala Julian / Dr Tamala Julian wants her to be seen this week / Can you work pt in?

## 2013-07-07 NOTE — ED Provider Notes (Signed)
Medical screening examination/treatment/procedure(s) were performed by non-physician practitioner and as supervising physician I was immediately available for consultation/collaboration.   EKG Interpretation None        Fredia Sorrow, MD 07/07/13 1027

## 2013-07-08 NOTE — Telephone Encounter (Signed)
returned pt call.adv her that Dr.Smith does not have any new pt appt for the next couple of weeks.adv pt that a scheduler could call her back to offer an appt with another cardiologist that has earlier availability.pt sts that she does not want to see anyone else and that Dr.Smith told her he could see her in 1 week.adv her that f/u protocol is usually 10-14 days.adv her that Dr.Smith is not in the office,I will fwd him a message and call back with his response.pt verbalized understanding.

## 2013-07-08 NOTE — Telephone Encounter (Signed)
Spoke with this patient on the phone and she has agreed to see either Dr. Tamala Julian or another MD as soon as possible for her post hospital visit.

## 2013-07-10 NOTE — Telephone Encounter (Signed)
Next office day, work her in.

## 2013-07-11 NOTE — Telephone Encounter (Signed)
called to adv. we will work her in to Dr.Smith's schedule.lmom for pt to call back

## 2013-07-11 NOTE — Telephone Encounter (Signed)
Pt aware appt made with Dr.Smith on 07/14/13 pt adv to come in at 4pm.pt verbalized understanding

## 2013-07-14 ENCOUNTER — Ambulatory Visit (INDEPENDENT_AMBULATORY_CARE_PROVIDER_SITE_OTHER): Payer: 59 | Admitting: Interventional Cardiology

## 2013-07-14 ENCOUNTER — Encounter: Payer: Self-pay | Admitting: Interventional Cardiology

## 2013-07-14 VITALS — BP 110/70 | HR 66 | Ht 64.0 in | Wt 179.0 lb

## 2013-07-14 DIAGNOSIS — I208 Other forms of angina pectoris: Secondary | ICD-10-CM

## 2013-07-14 DIAGNOSIS — I10 Essential (primary) hypertension: Secondary | ICD-10-CM

## 2013-07-14 DIAGNOSIS — E785 Hyperlipidemia, unspecified: Secondary | ICD-10-CM

## 2013-07-14 DIAGNOSIS — I2089 Other forms of angina pectoris: Secondary | ICD-10-CM

## 2013-07-14 DIAGNOSIS — I2510 Atherosclerotic heart disease of native coronary artery without angina pectoris: Secondary | ICD-10-CM

## 2013-07-14 DIAGNOSIS — I209 Angina pectoris, unspecified: Secondary | ICD-10-CM

## 2013-07-14 MED ORDER — METOPROLOL TARTRATE 25 MG PO TABS: 12.5000 mg | ORAL_TABLET | Freq: Every day | ORAL | Status: DC

## 2013-07-14 MED ORDER — ISOSORBIDE MONONITRATE ER 60 MG PO TB24: 60.0000 mg | ORAL_TABLET | Freq: Every day | ORAL | Status: DC

## 2013-07-14 NOTE — Patient Instructions (Signed)
Your physician has recommended you make the following change in your medication:  1) INCREASE Isosorbide to 60mg  daily 2) DECREASE Metoprolol to 12.5mg  daily  Your physician has requested that you have a lexiscan myoview. For further information please visit [email or web address]. Please follow instruction sheet, as given.  You have a follow up appt scheduled for 08/13/13 @ 11:30 am

## 2013-07-14 NOTE — Progress Notes (Signed)
Patient ID: Veronica [name], female   DOB: 02-May-1950, 63 y.o.   MRN: [id number]    [address], [address] [place], [place]  [address] Phone: [phone] Fax:  [phone]  Date:  07/14/2013   ID:  Veronica [name], DOB November 07, 1950, MRN [id number]  PCP:  Veronica Pac, MD   ASSESSMENT:  1. Coronary atherosclerotic heart disease 2. 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 Hypertension 4. Hyperlipidemia  PLAN:  1. Increase isosorbide mononitrate to 60 mg each evening 2. Decrease metoprolol 12.5 mg per day 3. Lexa scan myocardial perfusion study prior to next OV. I need to exclude apical ischemia. If the anterior wall is ischemic, we should consider performing PCI on the LAD.  4. Office visit in 4-6 weeks    SUBJECTIVE: Veronica [name] is a 63 y.o. female who recently underwent coronary angiography and has three vessel intermediate stenoses involving the LAD, obtuse marginal, and PDA.Veronica [name] She is admitted to the hospital having prolonged chest discomfort. She ruled out for myocardial infarction. Angiography was performed but no PCI because there was no predominant culprit lesion. Medical therapy was chosen. She is on low-dose beta blocker therapy and isosorbide mononitrate 15 mg twice a day. Episodes of discomfort didn't occur in the evening. They're nonexertional and feel like a "discomfort". There is no severe pain. There is no dyspnea. She has been walking without inducing chest discomfort. She has mild headache from isosorbide. She's use nitroglycerin several times with prompt relief within minutes and acids did not seem to impact the discomfort.   Wt Readings from Last 3 Encounters:  07/14/13 179 lb (81.194 kg)  07/05/13 179 lb 0.2 oz (81.2 kg)  07/05/13 179 lb 0.2 oz (81.2 kg)     Past Medical History  Diagnosis Date  . Complication of anesthesia     extreme sore throat after aneathesia,   also      "woke up screamimg" after surgery  . Hypothyroidism   . Hypertension     stress  test 10 yrs ago.   pcp   dr Lennette Bihari little  . Anxiety   . Hyperlipemia   . Breast cyst 3/01    left  . Osteopenia     Current Outpatient Prescriptions  Medication Sig Dispense Refill  . ALPRAZolam (XANAX) 0.5 MG tablet Take 0.5 mg by mouth at bedtime as needed. For sleep      . aspirin EC 81 MG tablet Take 81 mg by mouth daily.      Veronica [name] atorvastatin (LIPITOR) 40 MG tablet Take 40 mg by mouth daily.      . clopidogrel (PLAVIX) 75 MG tablet Take 1 tablet (75 mg total) by mouth daily with breakfast.  30 tablet  1  . famotidine (PEPCID) 20 MG tablet Take 20 mg by mouth 2 (two) times daily.      . fish oil-omega-3 fatty acids 1000 MG capsule Take 1 g by mouth daily.      . hydrocortisone valerate cream (WESTCORT) 0.2 % Apply 1 application topically 2 (two) times daily as needed (rash).       . isosorbide mononitrate (IMDUR) 30 MG 24 hr tablet Take 1 tablet (30 mg total) by mouth daily.  30 tablet  1  . levothyroxine (SYNTHROID, LEVOTHROID) 125 MCG tablet Take 125 mcg by mouth daily.      . metoprolol tartrate (LOPRESSOR) 25 MG tablet Take 0.5 tablets (12.5 mg total) by mouth 2 (two) times daily.  30 tablet  1  .  Multiple Vitamin (MULITIVITAMIN WITH MINERALS) TABS Take 1 tablet by mouth daily.      . nitroGLYCERIN (NITROSTAT) 0.4 MG SL tablet Place 1 tablet (0.4 mg total) under the tongue every 5 (five) minutes as needed for chest pain.  25 tablet  3  . sertraline (ZOLOFT) 50 MG tablet Take 25 mg by mouth daily.       No current facility-administered medications for this visit.    Allergies:    Allergies  Allergen Reactions  . Ace Inhibitors Cough  . Prilosec [Omeprazole Magnesium] Other (See Comments)    Blisters and skin peels off  . Sulfa Antibiotics Other (See Comments)    Blisters and skin peels off  . Ultram [Tramadol Hcl]     Lapse of memory      Social History:  The patient  reports that she has never smoked. She has never used smokeless tobacco. She reports that she does not  drink alcohol or use illicit drugs.   ROS:  Please see the history of present illness.   Headaches with isosorbide mononitrate   All other systems reviewed and negative.   OBJECTIVE: VS:  BP 110/70  Pulse 66  Ht 5\' 4"  (1.626 m)  Wt 179 lb (81.194 kg)  BMI 30.71 kg/m2  LMP 02/06/2010 Well nourished, well developed, in no acute distress,  obese HEENT: normal Neck: JVD  flat absent . Carotid bruit  absent   Cardiac:  normal S1, S2; RRR; no murmur Lungs:  clear to auscultation bilaterally, no wheezing, rhonchi or rales Abd: soft, nontender, no hepatomegaly Ext: Edema  none . Pulses  2+ Skin: warm and dry Neuro:  CNs 2-12 intact, no focal abnormalities noted  EKG:  Not repeated at       Signed, Illene Labrador III, MD 07/14/2013 5:40 PM

## 2013-07-16 ENCOUNTER — Telehealth: Payer: Self-pay | Admitting: Interventional Cardiology

## 2013-07-16 DIAGNOSIS — I208 Other forms of angina pectoris: Secondary | ICD-10-CM

## 2013-07-16 MED ORDER — ISOSORBIDE MONONITRATE ER 60 MG PO TB24: 60.0000 mg | ORAL_TABLET | Freq: Every day | ORAL | Status: DC

## 2013-07-16 NOTE — Telephone Encounter (Signed)
Rx for Imdur 60mg  sent to pt pharmacy

## 2013-07-16 NOTE — Telephone Encounter (Signed)
New message      Pt want Dr Tamala Julian to know that imdur is working.  Need presc for 60mg  called in to [facility]

## 2013-07-28 ENCOUNTER — Ambulatory Visit (HOSPITAL_COMMUNITY): Payer: 59 | Attending: Cardiology | Admitting: Radiology

## 2013-07-28 VITALS — BP 115/70 | HR 71 | Ht 64.0 in | Wt 175.0 lb

## 2013-07-28 DIAGNOSIS — R0609 Other forms of dyspnea: Secondary | ICD-10-CM | POA: Insufficient documentation

## 2013-07-28 DIAGNOSIS — I208 Other forms of angina pectoris: Secondary | ICD-10-CM

## 2013-07-28 DIAGNOSIS — R0789 Other chest pain: Secondary | ICD-10-CM | POA: Insufficient documentation

## 2013-07-28 DIAGNOSIS — R079 Chest pain, unspecified: Secondary | ICD-10-CM

## 2013-07-28 DIAGNOSIS — R0600 Dyspnea, unspecified: Secondary | ICD-10-CM

## 2013-07-28 DIAGNOSIS — R0602 Shortness of breath: Secondary | ICD-10-CM | POA: Insufficient documentation

## 2013-07-28 DIAGNOSIS — I2510 Atherosclerotic heart disease of native coronary artery without angina pectoris: Secondary | ICD-10-CM

## 2013-07-28 DIAGNOSIS — R0989 Other specified symptoms and signs involving the circulatory and respiratory systems: Secondary | ICD-10-CM | POA: Insufficient documentation

## 2013-07-28 DIAGNOSIS — I10 Essential (primary) hypertension: Secondary | ICD-10-CM | POA: Insufficient documentation

## 2013-07-28 DIAGNOSIS — Z8249 Family history of ischemic heart disease and other diseases of the circulatory system: Secondary | ICD-10-CM | POA: Insufficient documentation

## 2013-07-28 MED ORDER — AMINOPHYLLINE 25 MG/ML IV SOLN
75.0000 mg | Freq: Once | INTRAVENOUS | Status: AC
  Administered 2013-07-28: 75 mg via INTRAVENOUS

## 2013-07-28 MED ORDER — TECHNETIUM TC 99M SESTAMIBI GENERIC - CARDIOLITE
11.0000 | Freq: Once | INTRAVENOUS | Status: AC | PRN
  Administered 2013-07-28: 11 via INTRAVENOUS

## 2013-07-28 MED ORDER — TECHNETIUM TC 99M SESTAMIBI GENERIC - CARDIOLITE
33.0000 | Freq: Once | INTRAVENOUS | Status: AC | PRN
  Administered 2013-07-28: 33 via INTRAVENOUS

## 2013-07-28 MED ORDER — REGADENOSON 0.4 MG/5ML IV SOLN
0.4000 mg | Freq: Once | INTRAVENOUS | Status: AC
  Administered 2013-07-28: 0.4 mg via INTRAVENOUS

## 2013-07-28 NOTE — Progress Notes (Signed)
  [facility] [address] [place], [place] [address] [phone]    Cardiology Nuclear Med Study  Veronica [name] is a 63 y.o. female     MRN : [id number]     DOB: December 06, 1950  Procedure Date: 07/28/2013  Nuclear Med Background Indication for Stress Test:  Evaluation for Ischemia and ER on 5/15 for Chest Pain History:  CAD, Cath 06/2013 (n/o dz), Cardiac CT 3-vessel interm. stenosis (med therapy) Cardiac Risk Factors: Family History - CAD, Hypertension and Lipids  Symptoms:  Chest Pain (last date of chest discomfort was this morning), DOE and SOB   Nuclear Pre-Procedure Caffeine/Decaff Intake:  None NPO After: 7:30am   Lungs:  clear O2 Sat: 95% on room air. IV 0.9% NS with Angio Cath:  22g  IV Site: L Antecubital  IV Started by:  Irven Baltimore, RN  Chest Size (in):  42 Cup Size: C  Height: 5\' 4"  (1.626 m)  Weight:  175 lb (79.379 kg)  BMI:  Body mass index is 30.02 kg/(m^2). Tech Comments:  Lopressor taken at [employer]    Nuclear Med Study 1 or 2 day study: 1 day  Stress Test Type:  Treadmill/Lexiscan  Reading MD: n/a  Order Authorizing Provider:  Mallie Mussel Smith,MD  Resting Radionuclide: Technetium 51m Sestamibi  Resting Radionuclide Dose: 10.8 mCi   Stress Radionuclide:  Technetium 37m Sestamibi  Stress Radionuclide Dose: 33.0 mCi           Stress Protocol Rest HR: 71 Stress HR: 123  Rest BP: 115/70 Stress BP: 144/67  Exercise Time (min): n/a METS: n/a           Dose of Adenosine (mg):  n/a Dose of Lexiscan: 0.4 mg  Dose of Atropine (mg): n/a Dose of Dobutamine: n/a mcg/kg/min (at max HR)  Stress Test Technologist: Glade Lloyd, BS-ES  Nuclear Technologist:  Annye Rusk, CNMT     Rest Procedure:  Myocardial perfusion imaging was performed at rest 45 minutes following the intravenous administration of Technetium 33m Sestamibi. Rest ECG:SR 71 bpm   Stress Procedure:  The patient received IV Lexiscan 0.4 mg over 15-seconds with concurrent low  level exercise and then Technetium 46m Sestamibi was injected at 30-seconds while the patient continued walking one more minute.  Quantitative spect images were obtained after a 45-minute delay.  During the infusion of Lexiscan the patient complained of SOB, nausea, flushed and head pressure.  These symptoms began to resolve in recovery.  Stress ECG: No significant change from baseline ECG  QPS Raw Data Images: Soft tissue (diaphragm, bowel) underlie heart.   Stress Images:  Normal homogeneous uptake in all areas of the myocardium. Rest Images:  Normal homogeneous uptake in all areas of the myocardium. Subtraction (SDS):  No evidence of ischemia. Transient Ischemic Dilatation (Normal <1.22):  1.29 Lung/Heart Ratio (Normal <0.45):  0.30  Quantitative Gated Spect Images QGS EDV:  60 ml QGS ESV:  17 ml  Impression Exercise Capacity:  Lexiscan with low level exercise. BP Response:  Normal blood pressure response. Clinical Symptoms:  No chest pain. ECG Impression:  No significant ST segment change suggestive of ischemia. Comparison with Prior Nuclear Study: No previous nuclear study performed  Overall Impression:  Normal stress nuclear study.  LV Ejection Fraction: 72%.  LV Wall Motion:  NL LV Function; NL Wall Motion  Dorris Carnes

## 2013-07-31 ENCOUNTER — Telehealth: Payer: Self-pay

## 2013-07-31 NOTE — Telephone Encounter (Signed)
pt aware of myoview results.  Normal stress study .pt verbalized understanding.

## 2013-07-31 NOTE — Telephone Encounter (Signed)
Message copied by Lamar Laundry on Thu Jul 31, 2013  8:31 AM ------      Message from: Daneen Schick      Created: Wed Jul 30, 2013  1:49 PM       Normal stress study ------

## 2013-08-13 ENCOUNTER — Encounter: Payer: Self-pay | Admitting: Interventional Cardiology

## 2013-08-13 ENCOUNTER — Ambulatory Visit (INDEPENDENT_AMBULATORY_CARE_PROVIDER_SITE_OTHER): Payer: 59 | Admitting: Interventional Cardiology

## 2013-08-13 VITALS — BP 120/68 | HR 87 | Ht 64.0 in | Wt 175.1 lb

## 2013-08-13 DIAGNOSIS — I209 Angina pectoris, unspecified: Secondary | ICD-10-CM

## 2013-08-13 DIAGNOSIS — I208 Other forms of angina pectoris: Secondary | ICD-10-CM

## 2013-08-13 DIAGNOSIS — E785 Hyperlipidemia, unspecified: Secondary | ICD-10-CM

## 2013-08-13 DIAGNOSIS — I10 Essential (primary) hypertension: Secondary | ICD-10-CM

## 2013-08-13 DIAGNOSIS — I2510 Atherosclerotic heart disease of native coronary artery without angina pectoris: Secondary | ICD-10-CM

## 2013-08-13 MED ORDER — ISOSORBIDE MONONITRATE ER 120 MG PO TB24: 120.0000 mg | ORAL_TABLET | Freq: Every day | ORAL | Status: DC

## 2013-08-13 NOTE — Progress Notes (Signed)
Patient ID: Veronica [name], female   DOB: Jun 01, 1950, 63 y.o.   MRN: [id number]    [address] [place], [place]  [address] Phone: [phone] Fax:  [phone]  Date:  08/13/2013   ID:  Veronica [name], DOB April 29, 1950, MRN [id number]  PCP:  Gennette Pac, MD   ASSESSMENT:  1. Coronary artery disease with mid to distal LAD, diffuse distal circumflex, and PDA disease. She has had significant improvement with titration of medical therapy but still has some exertional angina. 2. Hyperlipidemia 3. Hypertension 4. Angina pectoris on exertion, mild in intensity   PLAN:  1. Increase isosorbide 120 mg per day 2. Clinical followup in 2 months   SUBJECTIVE: Veronica [name] is a 63 y.o. female who had dramatic improvement in angina with addition of isosorbide mononitrate and 60 mg per day. She still has some angina with moderate physical activity but generally feels better. She had a myocardial perfusion study performed in June that was unremarkable for any evidence of ischemia and felt to be a low risk study.   Wt Readings from Last 3 Encounters:  08/13/13 175 lb 1.9 oz (79.434 kg)  07/28/13 175 lb (79.379 kg)  07/14/13 179 lb (81.194 kg)     Past Medical History  Diagnosis Date  . Complication of anesthesia     extreme sore throat after aneathesia,   also      "woke up screamimg" after surgery  . Hypothyroidism   . Hypertension     stress test 10 yrs ago.   pcp   dr Lennette Bihari little  . Anxiety   . Hyperlipemia   . Breast cyst 3/01    left  . Osteopenia     Current Outpatient Prescriptions  Medication Sig Dispense Refill  . ALPRAZolam (XANAX) 0.5 MG tablet Take 0.5 mg by mouth at bedtime as needed. For sleep      . aspirin EC 81 MG tablet Take 81 mg by mouth daily.      [name] atorvastatin (LIPITOR) 80 MG tablet Take 80 mg by mouth daily.      . clopidogrel (PLAVIX) 75 MG tablet Take 1 tablet (75 mg total) by mouth daily with breakfast.  30 tablet  1  . famotidine  (PEPCID) 20 MG tablet Take 20 mg by mouth 2 (two) times daily.      . fish oil-omega-3 fatty acids 1000 MG capsule Take 1 g by mouth daily.      . hydrocortisone valerate cream (WESTCORT) 0.2 % Apply 1 application topically 2 (two) times daily as needed (rash).       . isosorbide mononitrate (IMDUR) 60 MG 24 hr tablet Take 1 tablet (60 mg total) by mouth daily.  30 tablet  11  . levothyroxine (SYNTHROID, LEVOTHROID) 125 MCG tablet Take 125 mcg by mouth daily.      . metoprolol tartrate (LOPRESSOR) 25 MG tablet Take 0.5 tablets (12.5 mg total) by mouth daily.      . Multiple Vitamin (MULITIVITAMIN WITH MINERALS) TABS Take 1 tablet by mouth daily.      . nitroGLYCERIN (NITROSTAT) 0.4 MG SL tablet Place 1 tablet (0.4 mg total) under the tongue every 5 (five) minutes as needed for chest pain.  25 tablet  3  . sertraline (ZOLOFT) 50 MG tablet Take 25 mg by mouth daily.       No current facility-administered medications for this visit.    Allergies:    Allergies  Allergen Reactions  .  Ace Inhibitors Cough  . Prilosec [Omeprazole Magnesium] Other (See Comments)    Blisters and skin peels off  . Sulfa Antibiotics Other (See Comments)    Blisters and skin peels off  . Ultram [Tramadol Hcl]     Lapse of memory      Social History:  The patient  reports that she has never smoked. She has never used smokeless tobacco. She reports that she does not drink alcohol or use illicit drugs.   ROS:  Please see the history of present illness.   No rest pain. No nitroglycerin use   All other systems reviewed and negative.   OBJECTIVE: VS:  BP 120/68  Pulse 87  Ht 5\' 4"  (1.626 m)  Wt 175 lb 1.9 oz (79.434 kg)  BMI 30.04 kg/m2  LMP 02/06/2010 Well nourished, well developed, in no acute distress, consistent with age  90: normal Neck: JVD flat. Carotid bruit absent  Cardiac:  normal S1, S2; RRR; no murmur Lungs:  clear to auscultation bilaterally, no wheezing, rhonchi or rales Abd: soft, nontender,  no hepatomegaly Ext: Edema absent. Pulses 2+  Skin: warm and dry Neuro:  CNs 2-12 intact, no focal abnormalities noted  EKG:  Not repeated       Signed, Illene Labrador III, MD 08/13/2013 12:08 PM

## 2013-08-13 NOTE — Patient Instructions (Signed)
Your physician has recommended you make the following change in your medication:  1) INCREASE Isosorbide to 120mg  daily. An Rx has been sent to your pharmacy  You have a follow up appointment scheduled for 10/16/13 @ 10:15 am

## 2013-08-19 ENCOUNTER — Other Ambulatory Visit: Payer: Self-pay

## 2013-08-19 DIAGNOSIS — I208 Other forms of angina pectoris: Secondary | ICD-10-CM

## 2013-08-19 MED ORDER — CLOPIDOGREL BISULFATE 75 MG PO TABS: 75.0000 mg | ORAL_TABLET | Freq: Every day | ORAL | Status: DC

## 2013-08-19 MED ORDER — METOPROLOL TARTRATE 25 MG PO TABS: 12.5000 mg | ORAL_TABLET | Freq: Every day | ORAL | Status: DC

## 2013-09-19 ENCOUNTER — Ambulatory Visit (INDEPENDENT_AMBULATORY_CARE_PROVIDER_SITE_OTHER): Payer: 59 | Admitting: Obstetrics & Gynecology

## 2013-09-19 ENCOUNTER — Encounter: Payer: Self-pay | Admitting: Obstetrics & Gynecology

## 2013-09-19 VITALS — BP 106/60 | HR 72 | Resp 18 | Ht 63.75 in | Wt 173.0 lb

## 2013-09-19 DIAGNOSIS — Z0000 Encounter for general adult medical examination without abnormal findings: Secondary | ICD-10-CM

## 2013-09-19 DIAGNOSIS — Z01419 Encounter for gynecological examination (general) (routine) without abnormal findings: Secondary | ICD-10-CM

## 2013-09-19 DIAGNOSIS — Z124 Encounter for screening for malignant neoplasm of cervix: Secondary | ICD-10-CM

## 2013-09-19 LAB — POCT URINALYSIS DIPSTICK
Bilirubin, UA: NEGATIVE
Blood, UA: NEGATIVE
GLUCOSE UA: NEGATIVE
Ketones, UA: NEGATIVE
LEUKOCYTES UA: NEGATIVE
Nitrite, UA: NEGATIVE
Protein, UA: NEGATIVE
Urobilinogen, UA: NEGATIVE
pH, UA: 5

## 2013-09-19 MED ORDER — CLOBETASOL PROPIONATE 0.05 % EX OINT: 1.0000 "application " | TOPICAL_OINTMENT | Freq: Two times a day (BID) | CUTANEOUS | Status: DC

## 2013-09-19 MED ORDER — ESTRADIOL 0.1 MG/GM VA CREA: TOPICAL_CREAM | VAGINAL | Status: DC

## 2013-09-19 NOTE — Addendum Note (Signed)
Addended by: Megan Salon on: 09/19/2013 11:50 AM   Modules accepted: Orders

## 2013-09-19 NOTE — Progress Notes (Addendum)
63 y.o. G2P2 MarriedCaucasianF here for annual exam.  Doing really well.  Family is good.  No vaginal bleeding.  Hospitalized in June for heart issues.  Diagnosed with atherosclerosis.  Seeing Dr. Tamala Julian every two months.  Has lost about 10 pounds.   Does see Dr. Rex Kras as PCP.  Physical was in January.  Thyroid testing was normal.  Patient's last menstrual period was 02/06/2010.          Sexually active: Yes.    The current method of family planning is post menopausal status.    Exercising: Yes.    Walking, gym daily Smoker:  no  Health Maintenance: Pap: 04/2011 Neg. HR HPV Neg History of abnormal Pap:  no MMG: 12/11/12 BIRADS0: Incomplete. 12/17/12 BIRADS2: benign  Colonoscopy:  2006 - Repeat in 10 years  BMD:   11/2010, -1.5/0.9 TDaP: 2014 Screening Labs: Hospital 06/2013, Hb today: 06/2013, Urine today: neg   reports that she has never smoked. She has never used smokeless tobacco. She reports that she does not drink alcohol or use illicit drugs.  Past Medical History  Diagnosis Date  . Complication of anesthesia     extreme sore throat after aneathesia,   also      "woke up screamimg" after surgery  . Hypothyroidism   . Hypertension     stress test 10 yrs ago.   pcp   dr Lennette Bihari little  . Anxiety   . Hyperlipemia   . Breast cyst 3/01    left  . Osteopenia     Past Surgical History  Procedure Laterality Date  . Laparoscopic cholecystectomy  4/97  . Wrist fracture surgery    . Dilation and curettage of uterus    . Tonsillectomy    . Posterior cervical fusion/foraminotomy  04/04/2011    Procedure: POSTERIOR CERVICAL FUSION/FORAMINOTOMY LEVEL 1;  Surgeon: Charlie Pitter, MD;  Location: [facility];  Service: Neurosurgery;  Laterality: Left;  Left Cervical Six-Seven Laminectomy, Foraminotomy, Diskectomy     Current Outpatient Prescriptions  Medication Sig Dispense Refill  . ALPRAZolam (XANAX) 0.5 MG tablet Take 0.5 mg by mouth at bedtime as needed. For sleep      . aspirin EC  81 MG tablet Take 81 mg by mouth daily.      [name] atorvastatin (LIPITOR) 80 MG tablet Take 80 mg by mouth daily.      [name] augmented betamethasone dipropionate (DIPROLENE-AF) 0.05 % cream       . clopidogrel (PLAVIX) 75 MG tablet Take 1 tablet (75 mg total) by mouth daily with breakfast.  30 tablet  6  . famotidine (PEPCID) 20 MG tablet Take 20 mg by mouth 2 (two) times daily.      . fish oil-omega-3 fatty acids 1000 MG capsule Take 1 g by mouth daily.      . halobetasol (ULTRAVATE) 0.05 % cream       . hydrocortisone valerate cream (WESTCORT) 0.2 % Apply 1 application topically 2 (two) times daily as needed (rash).       . isosorbide mononitrate (IMDUR) 120 MG 24 hr tablet Take 1 tablet (120 mg total) by mouth daily.  30 tablet  11  . levothyroxine (SYNTHROID, LEVOTHROID) 125 MCG tablet Take 125 mcg by mouth daily.      . metoprolol tartrate (LOPRESSOR) 25 MG tablet Take 0.5 tablets (12.5 mg total) by mouth daily.  15 tablet  6  . Multiple Vitamin (MULITIVITAMIN WITH MINERALS) TABS Take 1 tablet by mouth daily.      [name]  nitroGLYCERIN (NITROSTAT) 0.4 MG SL tablet Place 1 tablet (0.4 mg total) under the tongue every 5 (five) minutes as needed for chest pain.  25 tablet  3  . sertraline (ZOLOFT) 50 MG tablet Take 25 mg by mouth daily.       No current facility-administered medications for this visit.    Family History  Problem Relation Age of Onset  . Heart disease Father     ROS:  Pertinent items are noted in HPI.  Otherwise, a comprehensive ROS was negative.  Exam:   BP 106/60  Pulse 72  Resp 18  Ht 5' 3.75" (1.619 m)  Wt 173 lb (78.472 kg)  BMI 29.94 kg/m2  LMP 02/06/2010  Weight change:  -10#Height: 5' 3.75" (161.9 cm)  Ht Readings from Last 3 Encounters:  09/19/13 5' 3.75" (1.619 m)  08/13/13 5\' 4"  (1.626 m)  07/28/13 5\' 4"  (1.626 m)    General appearance: alert, cooperative and appears stated age Head: Normocephalic, without obvious abnormality, atraumatic Neck: no adenopathy,  supple, symmetrical, trachea midline and thyroid normal to inspection and palpation Lungs: clear to auscultation bilaterally Breasts: normal appearance, no masses or tenderness Heart: regular rate and rhythm Abdomen: soft, non-tender; bowel sounds normal; no masses,  no organomegaly Extremities: extremities normal, atraumatic, no cyanosis or edema Skin: Skin color, texture, turgor normal. No rashes or lesions Lymph nodes: Cervical, supraclavicular, and axillary nodes normal. No abnormal inguinal nodes palpated Neurologic: Grossly normal   Pelvic: External genitalia:  no lesions              Urethra:  normal appearing urethra with no masses, tenderness or lesions              Bartholins and Skenes: normal                 Vagina: normal appearing vagina with normal color and discharge, no lesions              Cervix: no lesions              Pap taken: Yes.   Bimanual Exam:  Uterus:  normal size, contour, position, consistency, mobility, non-tender              Adnexa: normal adnexa and no mass, fullness, tenderness               Rectovaginal: Confirms               Anus:  normal sphincter tone, no lesions  A:  Well Woman with normal exam, PMP not on HRT  Hypertension  New diagnosis of atherosclerosis.  Seeing Dr. Linard Millers Hyperlipidemia Vaginal atrophic changes  Hypothyroidism  Hemorrhoids  P: Mammogram yearly.  Pap with neg HR HPV 3/13.  Pap today.  Estrace vaginal cream.  Uses small amount peri-urethrally when feels like she has an early UTI and this relieves symptoms.  Uses very rarely.  Last Rx was almost 2 1/2 years ago. Labs yearly with Dr. Rex Kras.  return annually or prn An After Visit Summary was printed and given to the patient.

## 2013-09-24 LAB — IPS PAP TEST WITH REFLEX TO HPV

## 2013-10-16 ENCOUNTER — Encounter: Payer: Self-pay | Admitting: Interventional Cardiology

## 2013-10-16 ENCOUNTER — Ambulatory Visit (INDEPENDENT_AMBULATORY_CARE_PROVIDER_SITE_OTHER): Payer: 59 | Admitting: Interventional Cardiology

## 2013-10-16 VITALS — BP 124/72 | HR 76 | Ht 63.0 in | Wt 169.0 lb

## 2013-10-16 DIAGNOSIS — I209 Angina pectoris, unspecified: Secondary | ICD-10-CM

## 2013-10-16 DIAGNOSIS — I10 Essential (primary) hypertension: Secondary | ICD-10-CM

## 2013-10-16 DIAGNOSIS — E785 Hyperlipidemia, unspecified: Secondary | ICD-10-CM

## 2013-10-16 DIAGNOSIS — I208 Other forms of angina pectoris: Secondary | ICD-10-CM

## 2013-10-16 DIAGNOSIS — I2510 Atherosclerotic heart disease of native coronary artery without angina pectoris: Secondary | ICD-10-CM

## 2013-10-16 NOTE — Patient Instructions (Signed)
Your physician recommends that you continue on your current medications as directed. Please refer to the Current Medication list given to you today.  Your physician discussed the importance of regular exercise and recommended that you start or continue a regular exercise program for good health.  Be careful of the Your physician wants you to follow-up in: 6 montths You will receive a reminder letter in the mail two months in advance. If you don't receive a letter, please call our office to schedule the follow-up appointment.

## 2013-10-16 NOTE — Progress Notes (Signed)
Patient ID: Veronica [name], female   DOB: 1950/02/23, 63 y.o.   MRN: [id number]    [address], [address] [place], [place]  [address] Phone: [phone] Fax:  [phone]  Date:  10/16/2013   ID:  Veronica [name], DOB 10-Jun-1950, MRN [id number]  PCP:  Gennette Pac, MD   ASSESSMENT:  1. Exertional angina, resolved on medical therapy 2. Hyperlipidemia 3. Coronary artery disease with mid to distal LAD stenosis 4. Hypertension  PLAN:  1. Continue medical therapy 2. Report change in symptoms 3. Care in cold weather less than 40, to prevent cold pressor response 4. Use nitroglycerin of angina 5. Clinical followup in 6 months   SUBJECTIVE: Veronica [name] is a 63 y.o. female who has had no angina since medication adjustment. She now feels back to normal. No medication side effects.   Wt Readings from Last 3 Encounters:  10/16/13 169 lb (76.658 kg)  09/19/13 173 lb (78.472 kg)  08/13/13 175 lb 1.9 oz (79.434 kg)     Past Medical History  Diagnosis Date  . Complication of anesthesia     extreme sore throat after aneathesia,   also      "woke up screamimg" after surgery  . Hypothyroidism   . Hypertension     stress test 10 yrs ago.   pcp   dr Lennette Bihari little  . Anxiety   . Hyperlipemia   . Breast cyst 3/01    left  . Osteopenia     Current Outpatient Prescriptions  Medication Sig Dispense Refill  . ALPRAZolam (XANAX) 0.5 MG tablet Take 0.5 mg by mouth at bedtime as needed. For sleep      . aspirin EC 81 MG tablet Take 81 mg by mouth daily.      [name] augmented betamethasone dipropionate (DIPROLENE-AF) 0.05 % cream       . clobetasol ointment (TEMOVATE) 3.29 % Apply 1 application topically 2 (two) times daily. Apply as directed twice daily up to two weeks  60 g  0  . clopidogrel (PLAVIX) 75 MG tablet Take 1 tablet (75 mg total) by mouth daily with breakfast.  30 tablet  6  . estradiol (ESTRACE) 0.1 MG/GM vaginal cream 1 gram vaginally twice weekly  42.5 g  1  .  famotidine (PEPCID) 20 MG tablet Take 20 mg by mouth 2 (two) times daily.      . fish oil-omega-3 fatty acids 1000 MG capsule Take 1 g by mouth daily.      . halobetasol (ULTRAVATE) 0.05 % cream       . hydrocortisone valerate cream (WESTCORT) 0.2 % Apply 1 application topically 2 (two) times daily as needed (rash).       . isosorbide mononitrate (IMDUR) 120 MG 24 hr tablet Take 1 tablet (120 mg total) by mouth daily.  30 tablet  11  . levothyroxine (SYNTHROID, LEVOTHROID) 125 MCG tablet Take 125 mcg by mouth daily.      . metoprolol tartrate (LOPRESSOR) 25 MG tablet Take 0.5 tablets (12.5 mg total) by mouth daily.  15 tablet  6  . Multiple Vitamin (MULITIVITAMIN WITH MINERALS) TABS Take 1 tablet by mouth daily.      . nitroGLYCERIN (NITROSTAT) 0.4 MG SL tablet Place 1 tablet (0.4 mg total) under the tongue every 5 (five) minutes as needed for chest pain.  25 tablet  3  . rosuvastatin (CRESTOR) 40 MG tablet Take 40 mg by mouth daily.      . sertraline (  ZOLOFT) 50 MG tablet Take 25 mg by mouth daily.       No current facility-administered medications for this visit.    Allergies:    Allergies  Allergen Reactions  . Ace Inhibitors Cough  . Prilosec [Omeprazole Magnesium] Other (See Comments)    Blisters and skin peels off  . Sulfa Antibiotics Other (See Comments)    Blisters and skin peels off  . Ultram [Tramadol Hcl]     Lapse of memory      Social History:  The patient  reports that she has never smoked. She has never used smokeless tobacco. She reports that she does not drink alcohol or use illicit drugs.   ROS:  Please see the history of present illness.   No lightheadedness, dizziness, headache, nitroglycerin use, or other complaints.   All other systems reviewed and negative.   OBJECTIVE: VS:  BP 124/72  Pulse 76  Ht 5\' 3"  (1.6 m)  Wt 169 lb (76.658 kg)  BMI 29.94 kg/m2  LMP 02/06/2010 Well nourished, well developed, in no acute distress, obese HEENT: normal Neck: JVD  flat. Carotid bruit absent  Cardiac:  normal S1, S2; RRR; no murmur Lungs:  clear to auscultation bilaterally, no wheezing, rhonchi or rales Abd: soft, nontender, no hepatomegaly Ext: Edema absent. Pulses 2+ Skin: warm and dry Neuro:  CNs 2-12 intact, no focal abnormalities noted  EKG:  Not performed       Signed, Illene Labrador III, MD 10/16/2013 10:31 AM

## 2013-12-08 ENCOUNTER — Encounter: Payer: Self-pay | Admitting: Interventional Cardiology

## 2014-01-05 ENCOUNTER — Telehealth: Payer: Self-pay | Admitting: Interventional Cardiology

## 2014-01-05 DIAGNOSIS — Z01812 Encounter for preprocedural laboratory examination: Secondary | ICD-10-CM

## 2014-01-05 NOTE — Telephone Encounter (Signed)
New Message  Per pt calling increased chest pain, pt has unstable angina// blockages of 70%, Pt reports she is having to take nitro more often than normal ( An additional 2 nitro's places her at ease) // Pt reports she is taking all of her medications.. believes her medications should be changed. Please call back to discuss//sr

## 2014-01-05 NOTE — Telephone Encounter (Signed)
Pt reports that she has been having increased chest tightness/discomfort.when she was seem by Dr.Smith in September she was doing well, towards the end of September she began having increased chest discomfort.she began to use Nitro more frequently.pt reports that she is currently using Nitro several times daily. Pt reports that she Chest discomfort at rest and has been awoken at night and has had to use Nitro for relief.she sts that pain is relieved with Nitro.adv her that I will fwd a message to Dr.Smith and call back with his recommendations.pt agreeable and verbalized understanding

## 2014-01-06 DIAGNOSIS — I209 Angina pectoris, unspecified: Secondary | ICD-10-CM

## 2014-01-06 HISTORY — DX: Angina pectoris, unspecified: I20.9

## 2014-01-07 ENCOUNTER — Encounter (HOSPITAL_COMMUNITY): Payer: Self-pay | Admitting: Pharmacy Technician

## 2014-01-07 ENCOUNTER — Other Ambulatory Visit: Payer: Self-pay | Admitting: Interventional Cardiology

## 2014-01-07 ENCOUNTER — Telehealth: Payer: Self-pay

## 2014-01-07 ENCOUNTER — Other Ambulatory Visit (INDEPENDENT_AMBULATORY_CARE_PROVIDER_SITE_OTHER): Payer: 59 | Admitting: *Deleted

## 2014-01-07 DIAGNOSIS — Z01812 Encounter for preprocedural laboratory examination: Secondary | ICD-10-CM

## 2014-01-07 DIAGNOSIS — I209 Angina pectoris, unspecified: Secondary | ICD-10-CM

## 2014-01-07 LAB — CBC WITH DIFFERENTIAL/PLATELET
Basophils Absolute: 0 10*3/uL (ref 0.0–0.1)
Basophils Relative: 0.5 % (ref 0.0–3.0)
EOS PCT: 1.3 % (ref 0.0–5.0)
Eosinophils Absolute: 0.1 10*3/uL (ref 0.0–0.7)
HEMATOCRIT: 41.7 % (ref 36.0–46.0)
HEMOGLOBIN: 14 g/dL (ref 12.0–15.0)
LYMPHS PCT: 18.1 % (ref 12.0–46.0)
Lymphs Abs: 1.1 10*3/uL (ref 0.7–4.0)
MCHC: 33.5 g/dL (ref 30.0–36.0)
MCV: 89.5 fl (ref 78.0–100.0)
MONOS PCT: 5.3 % (ref 3.0–12.0)
Monocytes Absolute: 0.3 10*3/uL (ref 0.1–1.0)
NEUTROS ABS: 4.4 10*3/uL (ref 1.4–7.7)
Neutrophils Relative %: 74.8 % (ref 43.0–77.0)
Platelets: 210 10*3/uL (ref 150.0–400.0)
RBC: 4.65 Mil/uL (ref 3.87–5.11)
RDW: 13.9 % (ref 11.5–15.5)
WBC: 5.8 10*3/uL (ref 4.0–10.5)

## 2014-01-07 NOTE — Telephone Encounter (Signed)
We are on maximal medical therapy. We need to schedule her for repeat coronary angios and possible PCI/stent of LAD.

## 2014-01-07 NOTE — Telephone Encounter (Signed)
Pt aware of Dr.Smith recommendations. We are on maximal medical therapy. We need to schedule her for repeat coronary angios and possible PCI/stent of LAD.  Pt agreeable with plan.pt adv that I will attempt to have pt scheduled with Dr.Smith for 12/3  I will call back with confirmed date and time for cath Pt verbalized understanding.

## 2014-01-07 NOTE — Telephone Encounter (Signed)
Pt aware with verbal understanding. cardiac cath is scheduled for 12/3 @ 10:30 am with Dr.Smith. Pt will come in to the office today for precath labs. Pt aware preprocedure instructions will be left at the front desk for pick up.

## 2014-01-08 ENCOUNTER — Encounter (HOSPITAL_COMMUNITY): Payer: Self-pay | Admitting: General Practice

## 2014-01-08 ENCOUNTER — Ambulatory Visit (HOSPITAL_COMMUNITY)
Admission: RE | Admit: 2014-01-08 | Discharge: 2014-01-09 | Disposition: A | Discharge status: Home / Self Care | Payer: 59 | Source: Ambulatory Visit | Attending: Interventional Cardiology | Admitting: Interventional Cardiology

## 2014-01-08 ENCOUNTER — Encounter (HOSPITAL_COMMUNITY)
Admission: RE | Disposition: A | Discharge status: Home / Self Care | Payer: 59 | Source: Ambulatory Visit | Attending: Interventional Cardiology

## 2014-01-08 DIAGNOSIS — M7989 Other specified soft tissue disorders: Secondary | ICD-10-CM | POA: Diagnosis not present

## 2014-01-08 DIAGNOSIS — Z79899 Other long term (current) drug therapy: Secondary | ICD-10-CM | POA: Insufficient documentation

## 2014-01-08 DIAGNOSIS — F419 Anxiety disorder, unspecified: Secondary | ICD-10-CM | POA: Insufficient documentation

## 2014-01-08 DIAGNOSIS — R208 Other disturbances of skin sensation: Secondary | ICD-10-CM

## 2014-01-08 DIAGNOSIS — E039 Hypothyroidism, unspecified: Secondary | ICD-10-CM | POA: Insufficient documentation

## 2014-01-08 DIAGNOSIS — E785 Hyperlipidemia, unspecified: Secondary | ICD-10-CM | POA: Diagnosis not present

## 2014-01-08 DIAGNOSIS — M8580 Other specified disorders of bone density and structure, unspecified site: Secondary | ICD-10-CM | POA: Insufficient documentation

## 2014-01-08 DIAGNOSIS — Z9861 Coronary angioplasty status: Secondary | ICD-10-CM

## 2014-01-08 DIAGNOSIS — Z7902 Long term (current) use of antithrombotics/antiplatelets: Secondary | ICD-10-CM | POA: Diagnosis not present

## 2014-01-08 DIAGNOSIS — Z955 Presence of coronary angioplasty implant and graft: Secondary | ICD-10-CM

## 2014-01-08 DIAGNOSIS — I2510 Atherosclerotic heart disease of native coronary artery without angina pectoris: Secondary | ICD-10-CM

## 2014-01-08 DIAGNOSIS — I10 Essential (primary) hypertension: Secondary | ICD-10-CM | POA: Insufficient documentation

## 2014-01-08 DIAGNOSIS — Z8679 Personal history of other diseases of the circulatory system: Secondary | ICD-10-CM

## 2014-01-08 DIAGNOSIS — I209 Angina pectoris, unspecified: Secondary | ICD-10-CM | POA: Diagnosis present

## 2014-01-08 DIAGNOSIS — I208 Other forms of angina pectoris: Secondary | ICD-10-CM

## 2014-01-08 DIAGNOSIS — I25119 Atherosclerotic heart disease of native coronary artery with unspecified angina pectoris: Secondary | ICD-10-CM | POA: Diagnosis not present

## 2014-01-08 DIAGNOSIS — R200 Anesthesia of skin: Secondary | ICD-10-CM | POA: Diagnosis not present

## 2014-01-08 DIAGNOSIS — I25118 Atherosclerotic heart disease of native coronary artery with other forms of angina pectoris: Secondary | ICD-10-CM

## 2014-01-08 DIAGNOSIS — R2233 Localized swelling, mass and lump, upper limb, bilateral: Secondary | ICD-10-CM

## 2014-01-08 DIAGNOSIS — I200 Unstable angina: Secondary | ICD-10-CM | POA: Insufficient documentation

## 2014-01-08 HISTORY — PX: PERCUTANEOUS STENT INTERVENTION: SHX5500

## 2014-01-08 HISTORY — DX: Personal history of other diseases of the nervous system and sense organs: Z86.69

## 2014-01-08 HISTORY — PX: CARDIAC CATHETERIZATION: SHX172

## 2014-01-08 HISTORY — PX: CORONARY ANGIOPLASTY WITH STENT PLACEMENT: SHX49

## 2014-01-08 HISTORY — DX: Atherosclerotic heart disease of native coronary artery with angina pectoris with documented spasm: I25.111

## 2014-01-08 HISTORY — DX: Thyrotoxicosis, unspecified without thyrotoxic crisis or storm: E05.90

## 2014-01-08 HISTORY — DX: Unspecified osteoarthritis, unspecified site: M19.90

## 2014-01-08 HISTORY — DX: Angina pectoris, unspecified: I20.9

## 2014-01-08 HISTORY — PX: LEFT HEART CATHETERIZATION WITH CORONARY ANGIOGRAM: SHX5451

## 2014-01-08 HISTORY — DX: Atherosclerotic heart disease of native coronary artery without angina pectoris: I25.10

## 2014-01-08 HISTORY — DX: Coronary angioplasty status: Z98.61

## 2014-01-08 HISTORY — DX: Essential (primary) hypertension: I10

## 2014-01-08 LAB — BASIC METABOLIC PANEL
BUN: 22 mg/dL (ref 6–23)
CO2: 26 mEq/L (ref 19–32)
Calcium: 9.5 mg/dL (ref 8.4–10.5)
Chloride: 103 mEq/L (ref 96–112)
Creatinine, Ser: 0.8 mg/dL (ref 0.4–1.2)
GFR: 75.15 mL/min (ref >60.00)
Glucose, Bld: 99 mg/dL (ref 70–99)
POTASSIUM: 3.6 meq/L (ref 3.5–5.1)
Sodium: 134 mEq/L — ABNORMAL LOW (ref 135–145)

## 2014-01-08 LAB — POCT ACTIVATED CLOTTING TIME: Activated Clotting Time: 706 seconds

## 2014-01-08 LAB — PROTIME-INR
INR: 0.99 (ref 0.00–1.49)
Prothrombin Time: 13.2 seconds (ref 11.6–15.2)

## 2014-01-08 SURGERY — LEFT HEART CATHETERIZATION WITH CORONARY ANGIOGRAM
Anesthesia: LOCAL

## 2014-01-08 MED ORDER — NITROGLYCERIN 1 MG/10 ML FOR IR/CATH LAB
INTRA_ARTERIAL | Status: AC
  Filled 2014-01-08: qty 10

## 2014-01-08 MED ORDER — ONDANSETRON HCL 4 MG/2ML IJ SOLN: 4.0000 mg | Freq: Four times a day (QID) | INTRAMUSCULAR | Status: DC | PRN

## 2014-01-08 MED ORDER — ASPIRIN 81 MG PO CHEW
81.0000 mg | CHEWABLE_TABLET | Freq: Every day | ORAL | Status: DC
  Administered 2014-01-09: 81 mg via ORAL
  Filled 2014-01-08: qty 1

## 2014-01-08 MED ORDER — OMEGA-3-ACID ETHYL ESTERS 1 G PO CAPS
1.0000 g | ORAL_CAPSULE | Freq: Every day | ORAL | Status: DC
  Filled 2014-01-08 (×2): qty 1

## 2014-01-08 MED ORDER — HEPARIN SODIUM (PORCINE) 1000 UNIT/ML IJ SOLN
INTRAMUSCULAR | Status: AC
  Filled 2014-01-08: qty 1

## 2014-01-08 MED ORDER — NITROGLYCERIN IN D5W 200-5 MCG/ML-% IV SOLN
INTRAVENOUS | Status: AC
  Filled 2014-01-08: qty 250

## 2014-01-08 MED ORDER — FAMOTIDINE IN NACL 20-0.9 MG/50ML-% IV SOLN
20.0000 mg | Freq: Once | INTRAVENOUS | Status: AC
  Filled 2014-01-08: qty 50

## 2014-01-08 MED ORDER — SODIUM CHLORIDE 0.9 % IJ SOLN: 3.0000 mL | Freq: Two times a day (BID) | INTRAMUSCULAR | Status: DC

## 2014-01-08 MED ORDER — ACETAMINOPHEN 325 MG PO TABS: 650.0000 mg | ORAL_TABLET | ORAL | Status: DC | PRN

## 2014-01-08 MED ORDER — INFLUENZA VAC SPLIT QUAD 0.5 ML IM SUSY
0.5000 mL | PREFILLED_SYRINGE | Freq: Once | INTRAMUSCULAR | Status: DC
  Filled 2014-01-08: qty 0.5

## 2014-01-08 MED ORDER — ADENOSINE 12 MG/4ML IV SOLN
16.0000 mL | Freq: Once | INTRAVENOUS | Status: DC
  Filled 2014-01-08: qty 16

## 2014-01-08 MED ORDER — SODIUM CHLORIDE 0.9 % IJ SOLN: 3.0000 mL | INTRAMUSCULAR | Status: DC | PRN

## 2014-01-08 MED ORDER — MIDAZOLAM HCL 2 MG/2ML IJ SOLN
INTRAMUSCULAR | Status: AC
  Filled 2014-01-08: qty 2

## 2014-01-08 MED ORDER — VERAPAMIL HCL 2.5 MG/ML IV SOLN
INTRAVENOUS | Status: AC
  Filled 2014-01-08: qty 2

## 2014-01-08 MED ORDER — ROSUVASTATIN CALCIUM 40 MG PO TABS
40.0000 mg | ORAL_TABLET | Freq: Every day | ORAL | Status: DC
  Administered 2014-01-09: 10:00:00 40 mg via ORAL
  Filled 2014-01-08: qty 1

## 2014-01-08 MED ORDER — SODIUM CHLORIDE 0.9 % IV SOLN: 250.0000 mL | INTRAVENOUS | Status: DC | PRN

## 2014-01-08 MED ORDER — FAMOTIDINE 20 MG PO TABS
20.0000 mg | ORAL_TABLET | Freq: Two times a day (BID) | ORAL | Status: DC
  Administered 2014-01-08 – 2014-01-09 (×2): 20 mg via ORAL
  Filled 2014-01-08 (×3): qty 1

## 2014-01-08 MED ORDER — NITROGLYCERIN IN D5W 200-5 MCG/ML-% IV SOLN
0.0000 ug/min | INTRAVENOUS | Status: AC
  Administered 2014-01-08: 20 ug/min via INTRAVENOUS

## 2014-01-08 MED ORDER — FENTANYL CITRATE 0.05 MG/ML IJ SOLN
INTRAMUSCULAR | Status: AC
  Filled 2014-01-08: qty 2

## 2014-01-08 MED ORDER — FAMOTIDINE IN NACL 20-0.9 MG/50ML-% IV SOLN
INTRAVENOUS | Status: AC
  Filled 2014-01-08: qty 50

## 2014-01-08 MED ORDER — SODIUM CHLORIDE 0.9 % IV SOLN: INTRAVENOUS | Status: DC

## 2014-01-08 MED ORDER — NITROGLYCERIN 0.4 MG SL SUBL: 0.4000 mg | SUBLINGUAL_TABLET | SUBLINGUAL | Status: DC | PRN

## 2014-01-08 MED ORDER — OXYCODONE-ACETAMINOPHEN 5-325 MG PO TABS: 1.0000 | ORAL_TABLET | ORAL | Status: DC | PRN

## 2014-01-08 MED ORDER — BIVALIRUDIN 250 MG IV SOLR
INTRAVENOUS | Status: AC
  Filled 2014-01-08: qty 250

## 2014-01-08 MED ORDER — LIDOCAINE HCL (PF) 1 % IJ SOLN
INTRAMUSCULAR | Status: AC
  Filled 2014-01-08: qty 30

## 2014-01-08 MED ORDER — CLOPIDOGREL BISULFATE 75 MG PO TABS
75.0000 mg | ORAL_TABLET | Freq: Every day | ORAL | Status: DC
  Administered 2014-01-09: 08:00:00 75 mg via ORAL
  Filled 2014-01-08: qty 1

## 2014-01-08 MED ORDER — LEVOTHYROXINE SODIUM 125 MCG PO TABS
125.0000 ug | ORAL_TABLET | Freq: Every day | ORAL | Status: DC
  Administered 2014-01-09: 125 ug via ORAL
  Filled 2014-01-08 (×2): qty 1

## 2014-01-08 MED ORDER — SODIUM CHLORIDE 0.9 % IV SOLN: INTRAVENOUS | Status: AC

## 2014-01-08 MED ORDER — ASPIRIN 81 MG PO CHEW: 81.0000 mg | CHEWABLE_TABLET | ORAL | Status: DC

## 2014-01-08 MED ORDER — ISOSORBIDE MONONITRATE ER 60 MG PO TB24
120.0000 mg | ORAL_TABLET | Freq: Every day | ORAL | Status: DC
  Administered 2014-01-09: 120 mg via ORAL
  Filled 2014-01-08: qty 2

## 2014-01-08 MED ORDER — CLOPIDOGREL BISULFATE 75 MG PO TABS
ORAL_TABLET | ORAL | Status: AC
  Filled 2014-01-08: qty 2

## 2014-01-08 MED ORDER — OMEGA-3 FATTY ACIDS 1000 MG PO CAPS: 1.0000 g | ORAL_CAPSULE | Freq: Every day | ORAL | Status: DC

## 2014-01-08 MED ORDER — METOPROLOL TARTRATE 12.5 MG HALF TABLET
12.5000 mg | ORAL_TABLET | Freq: Every day | ORAL | Status: DC
  Administered 2014-01-09: 12.5 mg via ORAL
  Filled 2014-01-08: qty 1

## 2014-01-08 MED ORDER — ADULT MULTIVITAMIN W/MINERALS CH
1.0000 | ORAL_TABLET | Freq: Every day | ORAL | Status: DC
  Filled 2014-01-08 (×2): qty 1

## 2014-01-08 MED ORDER — CYCLOBENZAPRINE HCL 10 MG PO TABS: 10.0000 mg | ORAL_TABLET | Freq: Three times a day (TID) | ORAL | Status: DC | PRN

## 2014-01-08 MED ORDER — HEPARIN (PORCINE) IN NACL 2-0.9 UNIT/ML-% IJ SOLN
INTRAMUSCULAR | Status: AC
  Filled 2014-01-08: qty 1500

## 2014-01-08 MED ORDER — ALPRAZOLAM 0.5 MG PO TABS: 0.5000 mg | ORAL_TABLET | Freq: Every evening | ORAL | Status: DC | PRN

## 2014-01-08 NOTE — Consult Note (Signed)
[facility]  Reason for Consult:  Right fore Referring Physician:  Dr. Tamala Julian, Cardiology MRN #:  [id number]  History of Present Illness: This is a 63 y.o. female who is s/p left heart catheterization (via right radial artery) today who we have been consulted on regarding right forearm swelling. She reported discomfort and "tightness" during her procedure and afterwards in recovery noticed increased swelling in her mid forearm to area above her elbow. Manual pressure was applied followed by ACE wrap and ice pack. She reports the swelling has significantly improved. She describes some numbness of her fingertips and through the palmar aspect but excluding the dorsal aspect.   She has a past medical history of CAD, hypertension managed on multiple medications and hyperlipidemia managed on a statin.   Past Medical History  Diagnosis Date  . Complication of anesthesia     extreme sore throat after aneathesia,   also      "woke up screamimg" after surgery  . Hypothyroidism   . Hypertension     stress test 10 yrs ago.   pcp   dr Lennette Bihari little  . Anxiety   . Hyperlipemia   . Breast cyst 3/01    left  . Osteopenia    Past Surgical History  Procedure Laterality Date  . Laparoscopic cholecystectomy  4/97  . Wrist fracture surgery    . Dilation and curettage of uterus    . Tonsillectomy    . Posterior cervical fusion/foraminotomy  04/04/2011    Procedure: POSTERIOR CERVICAL FUSION/FORAMINOTOMY LEVEL 1;  Surgeon: Charlie Pitter, MD;  Location: [facility];  Service: Neurosurgery;  Laterality: Left;  Left Cervical Six-Seven Laminectomy, Foraminotomy, Diskectomy     Allergies  Allergen Reactions  . Ace Inhibitors Cough  . Prilosec [Omeprazole Magnesium] Other (See Comments)    Blisters and skin peels off  . Sulfa Antibiotics Other (See Comments)    Blisters and skin peels off  . Ultram [Tramadol Hcl]     Lapse of memory      Prior to Admission  medications   Medication Sig Start Date End Date Taking? Authorizing Provider  ALPRAZolam Duanne Moron) 0.5 MG tablet Take 0.5 mg by mouth at bedtime as needed. For sleep   Yes Historical Provider, MD  clopidogrel (PLAVIX) 75 MG tablet Take 1 tablet (75 mg total) by mouth daily with breakfast. 08/19/13  Yes Belva Crome III, MD  estradiol (ESTRACE) 0.1 MG/GM vaginal cream 1 gram vaginally twice weekly 09/19/13  Yes Lyman Speller, MD  famotidine (PEPCID) 20 MG tablet Take 20 mg by mouth 2 (two) times daily.   Yes Historical Provider, MD  fish oil-omega-3 fatty acids 1000 MG capsule Take 1 g by mouth daily.   Yes Historical Provider, MD  isosorbide mononitrate (IMDUR) 120 MG 24 hr tablet Take 1 tablet (120 mg total) by mouth daily. 08/13/13  Yes Belva Crome III, MD  levothyroxine (SYNTHROID, LEVOTHROID) 125 MCG tablet Take 125 mcg by mouth daily.   Yes Historical Provider, MD  metoprolol tartrate (LOPRESSOR) 25 MG tablet Take 12.5 mg by mouth daily.   Yes Historical Provider, MD  Multiple Vitamin (MULITIVITAMIN WITH MINERALS) TABS Take 1 tablet by mouth daily.   Yes Historical Provider, MD  nitroGLYCERIN (NITROSTAT) 0.4 MG SL tablet Place 1 tablet (0.4 mg total) under the tongue every 5 (five) minutes as needed for chest pain. 07/05/13  Yes Isaiah Serge, NP  rosuvastatin (CRESTOR) 40 MG tablet Take 40 mg  by mouth daily.   Yes Historical Provider, MD  sertraline (ZOLOFT) 50 MG tablet Take 25 mg by mouth daily.   Yes Historical Provider, MD  augmented betamethasone dipropionate (DIPROLENE-AF) 0.05 % cream Apply 1 application topically daily as needed (for skin irritation).  09/16/13   Historical Provider, MD  cyclobenzaprine (FLEXERIL) 10 MG tablet Take 10 mg by mouth 3 (three) times daily as needed for muscle spasms.  11/26/13   Historical Provider, MD  FLUARIX QUADRIVALENT 0.5 ML injection Inject 0.5 mLs into the muscle once.  11/25/13   Historical Provider, MD  halobetasol (ULTRAVATE) 0.05 % cream  Apply 1 application topically daily as needed (for skin irritation).  09/18/13   Historical Provider, MD  hydrocortisone valerate cream (WESTCORT) 0.2 % Apply 1 application topically 2 (two) times daily as needed (rash).  04/21/13   Historical Provider, MD    History   Social History  . Marital Status: Married    Spouse Name: N/A    Number of Children: N/A  . Years of Education: N/A   Occupational History  . Not on file.   Social History Main Topics  . Smoking status: Never Smoker   . Smokeless tobacco: Never Used  . Alcohol Use: No  . Drug Use: No  . Sexual Activity:    Partners: Male    Birth Control/ Protection: Post-menopausal   Other Topics Concern  . Not on file   Social History Narrative    Family History  Problem Relation Age of Onset  . Heart disease Father     ROS: [x]  Positive   [ ]  Negative   [ ]  All sytems reviewed and are negative  Cardiovascular:  []  chest pain/pressure []  palpitations []  SOB lying flat []  DOE []  pain in legs while walking []  pain in legs at rest []  pain in legs at night []  non-healing ulcers []  hx of DVT []  swelling in legs  Pulmonary: []  productive cough []  asthma/wheezing []  home O2  Neurologic: []  weakness in []  arms []  legs [x]  numbness in [x]  arms []  legs []  hx of CVA []  mini stroke [] difficulty speaking or slurred speech []  temporary loss of vision in one eye []  dizziness  Hematologic: []  hx of cancer []  bleeding problems []  problems with blood clotting easily  Endocrine:   []  diabetes []  thyroid disease  GI []  vomiting blood []  blood in stool  GU: []  CKD/renal failure []  HD--[]  M/W/F or []  T/T/S []  burning with urination []  blood in urine  Psychiatric: []  anxiety []  depression  Musculoskeletal: []  arthritis []  joint pain  Integumentary: []  rashes []  ulcers  Constitutional: []  fever []  chills   Physical Examination  Filed Vitals:   01/08/14 1400  BP: 121/77  Pulse: 83  Temp:     Resp:    Body mass index is 30.02 kg/(m^2).  General:  WDWN in NAD Gait: Not observed HENT: WNL, normocephalic Pulmonary: normal non-labored breathing, without Rales, rhonchi,  wheezing Cardiac: regular, without  Murmurs, rubs or gallops; without carotid bruits Skin: without rashes, without ulcers  Vascular Exam/Pulses: Non palpable right brachial artery. Right radial pulse per RN, compression band in place.  Extremities: 5/5 grip strength bilaterally. Moderate swelling of right mid forearm. Area is soft. Sensation intact to both upper extremities.  Musculoskeletal: no muscle wasting or atrophy  Neurologic: A&O X 3; Appropriate Affect ; SENSATION: normal; MOTOR FUNCTION:  moving all extremities equally. Speech is fluent/normal Psychiatric: Judgment intact, Mood & affect appropriate for pt's clinical  situation Lymph : No Cervical or Axillary lymphadenopathy    CBC    Component Value Date/Time   WBC 5.8 01/07/2014 1212   RBC 4.65 01/07/2014 1212   HGB 14.0 01/07/2014 1212   HCT 41.7 01/07/2014 1212   PLT 210.0 01/07/2014 1212   MCV 89.5 01/07/2014 1212   MCH 29.8 07/05/2013 1833   MCHC 33.5 01/07/2014 1212   RDW 13.9 01/07/2014 1212   LYMPHSABS 1.1 01/07/2014 1212   MONOABS 0.3 01/07/2014 1212   EOSABS 0.1 01/07/2014 1212   BASOSABS 0.0 01/07/2014 1212    BMET    Component Value Date/Time   NA 134* 01/07/2014 1212   K 3.6 01/07/2014 1212   CL 103 01/07/2014 1212   CO2 26 01/07/2014 1212   GLUCOSE 99 01/07/2014 1212   BUN 22 01/07/2014 1212   CREATININE 0.8 01/07/2014 1212   CALCIUM 9.5 01/07/2014 1212   GFRNONAA 78* 07/05/2013 1833   GFRAA >90 07/05/2013 1833    COAGS: Lab Results  Component Value Date   INR 0.99 01/08/2014   INR 1.04 07/04/2013    ASSESSMENT: This is a 63 y.o. female who is s/p left heart catheterization via right radial approach with right mid forearm swelling.   PLAN: Do not suspect forearm compartment syndrome. She does have some  numbness of her fingertips but with full motor and sensory function. Continue observation for compartment syndrome. Continue elevation and compression. If she develops progressive symptoms of compartment syndrome, she will need a forearm fasciotomy and hand surgery will need to be consulted.    Virgina Jock, PA-C [facility] Office: [phone] Pager: [phone]  Addendum  I have independently interviewed and examined the patient, and I agree with the physician assistant's findings.  Pt likely had some forearm bleeding related to his catheterization, etiology is unknown , based on history.  At this point, the patient has soft but swollen compartments in her right forearm.  There is no obvious echymosis that would be seen with a large bleed into the forearm.  I doubt there is any benefit to exploring the forearm at this time as she had intact hand grip with intact sensation in her fingertips.  I would also check the right upper extremity for a DVT, though the chronology seems more consistent with an arterial etiology.  Nothing more to offer from a vascular surgical viewpoint.  If she develops compartment syndrome in her right forearm, I would defer to Hand Surgery as I have no experience with forearm fasciotomies.  Adele Barthel, MD [facility] Office: [phone] Pager: [phone]  01/08/2014, 5:14 PM

## 2014-01-08 NOTE — Progress Notes (Signed)
UR Completed Danah Reinecke Graves-Bigelow, RN,BSN [phone]  

## 2014-01-08 NOTE — Progress Notes (Signed)
TR BAND REMOVAL  LOCATION:  right radial  DEFLATED PER PROTOCOL:  Yes.    TIME BAND OFF / DRESSING APPLIED:   1615   SITE UPON ARRIVAL:   Level 0  SITE AFTER BAND REMOVAL:  Level 1 mid forearm  REVERSE ALLEN'S TEST:    positive  CIRCULATION SENSATION AND MOVEMENT:  Within Normal Limits  Yes.    COMMENTS:  Develop   swelling mid forearm to elbow Dr. Tamala Julian notified and assessed. Pressure was applied with compression wrap ice and elevation resulted with decrease swelling. Please refer to progress notes from Dr. Bridgett Larsson and Alison Murray for further details. Patient stable doing better but will need to be monitored.

## 2014-01-08 NOTE — H&P (Signed)
Veronica [name] is a 63 y.o. female  Admit Date: 01/08/2014 Referring Physician: Valli Glance Blenda Bridegroom, M.D. Primary Cardiologist:: HWB Blenda Bridegroom, M.D. Chief complaint / reason for admission: Class IV angina  HPI: 63 year old found to have moderate three-vessel coronary disease upon presentation with unstable angina in May 2015. The culprit lesion was felt to be a severe mid to distal LAD stenosis at a bifurcation with a moderate size diagonal for N/A [place] 0, 1, 1 lesion. The diagonal diameter is felt to be too small to stent. This would therefore increased risk of side branch loss with intervention. We embarked upon a course of medical therapy which significantly improved symptoms for several months. Over the past week she has begun having angina at rest requiring multiple nitroglycerin tablets to resolve. Additionally there has been exertional angina. She has had no episodes of angina longer than 20 minutes. This week she has been awakened twice by episodes of angina requiring 2 nitroglycerin tablets each sublingually to resolve. She is pain-free currently.  A Myoview study done one month after the May catheterization was normal on medical therapy. Perfusion imaging has not been repeated.    PMH:    Past Medical History  Diagnosis Date  . Complication of anesthesia     extreme sore throat after aneathesia,   also      "woke up screamimg" after surgery  . Hypothyroidism   . Hypertension     stress test 10 yrs ago.   pcp   dr Lennette Bihari little  . Anxiety   . Hyperlipemia   . Breast cyst 3/01    left  . Osteopenia     PSH:    Past Surgical History  Procedure Laterality Date  . Laparoscopic cholecystectomy  4/97  . Wrist fracture surgery    . Dilation and curettage of uterus    . Tonsillectomy    . Posterior cervical fusion/foraminotomy  04/04/2011    Procedure: POSTERIOR CERVICAL FUSION/FORAMINOTOMY LEVEL 1;  Surgeon: Charlie Pitter, MD;  Location: [facility];  Service: Neurosurgery;   Laterality: Left;  Left Cervical Six-Seven Laminectomy, Foraminotomy, Diskectomy    ALLERGIES:   Ace inhibitors; Prilosec; Sulfa antibiotics; and Ultram Prior to Admit Meds:   Prescriptions prior to admission  Medication Sig Dispense Refill Last Dose  . ALPRAZolam (XANAX) 0.5 MG tablet Take 0.5 mg by mouth at bedtime as needed. For sleep   01/07/2014 at Unknown time  . clopidogrel (PLAVIX) 75 MG tablet Take 1 tablet (75 mg total) by mouth daily with breakfast. 30 tablet 6 01/08/2014 at 0700  . estradiol (ESTRACE) 0.1 MG/GM vaginal cream 1 gram vaginally twice weekly 42.5 g 1 Past Week at Unknown time  . famotidine (PEPCID) 20 MG tablet Take 20 mg by mouth 2 (two) times daily.   01/07/2014 at Unknown time  . fish oil-omega-3 fatty acids 1000 MG capsule Take 1 g by mouth daily.   01/07/2014 at Unknown time  . isosorbide mononitrate (IMDUR) 120 MG 24 hr tablet Take 1 tablet (120 mg total) by mouth daily. 30 tablet 11 01/08/2014 at 0700  . levothyroxine (SYNTHROID, LEVOTHROID) 125 MCG tablet Take 125 mcg by mouth daily.   01/08/2014 at 0700  . metoprolol tartrate (LOPRESSOR) 25 MG tablet Take 12.5 mg by mouth daily.   01/08/2014 at 0700  . Multiple Vitamin (MULITIVITAMIN WITH MINERALS) TABS Take 1 tablet by mouth daily.   01/07/2014 at Unknown time  . nitroGLYCERIN (NITROSTAT) 0.4 MG SL tablet  Place 1 tablet (0.4 mg total) under the tongue every 5 (five) minutes as needed for chest pain. 25 tablet 3 01/04/2014 at Unknown time  . rosuvastatin (CRESTOR) 40 MG tablet Take 40 mg by mouth daily.   01/08/2014 at 0700  . sertraline (ZOLOFT) 50 MG tablet Take 25 mg by mouth daily.   01/07/2014 at Unknown time  . augmented betamethasone dipropionate (DIPROLENE-AF) 0.05 % cream Apply 1 application topically daily as needed (for skin irritation).    More than a month at Unknown time  . cyclobenzaprine (FLEXERIL) 10 MG tablet Take 10 mg by mouth 3 (three) times daily as needed for muscle spasms.   0 More than a month at  Unknown time  . FLUARIX QUADRIVALENT 0.5 ML injection Inject 0.5 mLs into the muscle once.   0 More than a month at Unknown time  . halobetasol (ULTRAVATE) 0.05 % cream Apply 1 application topically daily as needed (for skin irritation).    More than a month at Unknown time  . hydrocortisone valerate cream (WESTCORT) 0.2 % Apply 1 application topically 2 (two) times daily as needed (rash).    More than a month at Unknown time   Family HX:    Family History  Problem Relation Age of Onset  . Heart disease Father    Social HX:    History   Social History  . Marital Status: Married    Spouse Name: N/A    Number of Children: N/A  . Years of Education: N/A   Occupational History  . Not on file.   Social History Main Topics  . Smoking status: Never Smoker   . Smokeless tobacco: Never Used  . Alcohol Use: No  . Drug Use: No  . Sexual Activity:    Partners: Male    Birth Control/ Protection: Post-menopausal   Other Topics Concern  . Not on file   Social History Narrative     ROS  Denies blood in her urine and stool. There've been no neurological complaints. She denies palpitations. No episodes of syncope. She denies claudication. No medication side effects. She denies orthopnea, PND, and other anginal complaints.  Physical Exam: Blood pressure 139/79, pulse 69, temperature 97.7 F (36.5 C), temperature source Oral, resp. rate 20, height 5\' 4"  (1.626 m), weight 175 lb (79.379 kg), last menstrual period 02/06/2010, SpO2 98 %.    Obese, in no distress, but appearing anxious. Skin is clear without cyanosis HEENT exam reveals no jaundice or pallor. Pupils are equal and reactive Neck exam reveals no JVD or carotid bruits Cardiac exam reveals no murmur, rub, click, or gallop. Abdominal exam reveals no tenderness. Bowel sounds are normal. Extremities reveal no edema. Radial and pedal pulses are 2+. Neurological exam is unremarkable.  Labs: Lab Results  Component Value Date    WBC 5.8 01/07/2014   HGB 14.0 01/07/2014   HCT 41.7 01/07/2014   MCV 89.5 01/07/2014   PLT 210.0 01/07/2014    Recent Labs Lab 01/07/14 1212  NA 134*  K 3.6  CL 103  CO2 26  BUN 22  CREATININE 0.8  CALCIUM 9.5  GLUCOSE 99      Radiology:  No new data  EKG:  Normal sinus rhythm with normal pattern.  ASSESSMENT:  1. Known moderate three-vessel coronary disease with angiographically significant mid to distal LAD bifurcation lesion identified by catheterization in May 2015. A myocardial perfusion study done in June on 2 drug medical therapy was negative for evidence of ischemia. Over  the past 7-10 days the patient's anginal complaints have accelerated. She is awakened twice from sleep within the past 5 days having angina requiring up to 2 nitroglycerin tablets for relief. She is also having exertional angina despite medical therapy. She has class IV angina with this recent acceleration. 2. Hypertension, essential 3. Hyperlipidemia  Plan:  1. The angiogram from May 2015 has been reviewed. I had a long discussion with the patient and family concerning the proposed procedure which is repeat coronary angiography and possible PCI depending upon current anatomy. Her symptoms have changed somewhat in quality with the addition of scapular discomfort associated with chest pressure. It is possible that she has a new acute lesion that is accelerated angina. The plan today is reevaluation and PCI if possible including consideration of the bifurcation lesion which will have a high risk of acute complications from the possibility of side branch occlusion. The procedure and the risk of stroke, death, myocardial infarction, emergency surgery, bleeding, limb ischemia, kidney injury, among others were discussed in detail with the patient who understands and is willing to proceed. Sinclair Grooms 01/08/2014 9:43 AM

## 2014-01-08 NOTE — Interval H&P Note (Signed)
Cath Lab Visit (complete for each Cath Lab visit)  Clinical Evaluation Leading to the Procedure:   ACS: No.  Non-ACS:    Anginal Classification: CCS IV  Anti-ischemic medical therapy: Maximal Therapy (2 or more classes of medications)  Non-Invasive Test Results: No non-invasive testing performed  Prior CABG: No previous CABG      History and Physical Interval Note:  01/08/2014 9:57 AM  Veronica [name]  has presented today for surgery, with the diagnosis of cp  The various methods of treatment have been discussed with the patient and family. After consideration of risks, benefits and other options for treatment, the patient has consented to  Procedure(s): LEFT HEART CATHETERIZATION WITH CORONARY ANGIOGRAM (N/A) as a surgical intervention .  The patient's history has been reviewed, patient examined, no change in status, stable for surgery.  I have reviewed the patient's chart and labs.  Questions were answered to the patient's satisfaction.     Sinclair Grooms

## 2014-01-08 NOTE — Progress Notes (Signed)
Called to see patient concerning swelling in her right forearm.  There was swelling from the mid forearm to just above the elbow from her cardiac cath from the right radial artery.  Dr. Tamala Julian was called and he took a look at the arm.  Manuel pressure was held x 20 minutes and a lot of the swelling resolved.  I then placed an ace bandage from the mid forearm to just above the elbow.  The patient stated that the arm felt better and the RN is going to put an ice pack on the arm as well.  Instructed the RN to call me if the site got worse.

## 2014-01-08 NOTE — Progress Notes (Signed)
Spoke to nurse in Cath Lab about patient Needing to take asa.  Nurse in cath lab said they would give it to the patient.

## 2014-01-08 NOTE — CV Procedure (Signed)
Left Heart Catheterization with Coronary Angiography , FFR and PCI Report  Veronica [name]  63 y.o.  female 1950-02-15  Procedure Date: 01/08/2014 Referring Physician: Hulan Fess, M.D. Primary Cardiologist:: [facility]WB Blenda Bridegroom, M.D.  INDICATIONS: Class IV angina  PROCEDURE: 1. Left heart catheterization; 2. Coronary angiography; 3. Left atrial artery; 4. FFR distal RCA; 5. DES mid LAD bifurcation  CONSENT:  The risks, benefits, and details of the procedure were explained in detail to the patient. Risks including death, stroke, heart attack, kidney injury, allergy, limb ischemia, bleeding and radiation injury were discussed.  The patient verbalized understanding and wanted to proceed.  Informed written consent was obtained.  PROCEDURE TECHNIQUE:  After Xylocaine anesthesia a 5 French Slender sheath was placed in the right radial artery with an angiocath and the modified Seldinger technique.  Coronary angiography was done using a 5 F JR 4 and JL 3.5 cm diagnostic catheter.  Left ventriculography was done using the JR 4 catheter and hand injection.   After reviewing the digital images we noted that the distal RCA lesion had progressed in severity and now in some views appeared to be at least 70%. The mid LAD bifurcation lesion appeared to contain 70-80% diagonal and 90% LAD stenosis with a Medina 0, 1, 1 classification.  We proceeded with PCI on the left anterior descending bifurcation, with the intent to perform single stent technique only as a side branch is too small in diameter to be stented. Bivalirudin bolus and infusion was started. The patient is on chronic clopidogrel therapy. An additional 150 mg was given in the cath lab. We used a XB LAD 3.0 cm 6 [place] guide catheter. We then use a double wire technique with a BMW placed in the diagonal and prolonged water down the LAD. We then used a 6 mm long by 2.0 mm Angiosculpt balloon over the BMW wire to dilate the ostium of the diagonal. A  nice angiographic result was obtained. We then used the same balloon over the Pro-water wire and dilate the LAD lesion. We then positioned and deployed a Resolute 2.25 x 18 mm long drug-eluting stent. After initial deployment the diagonal wire was retrieved without difficulty. We then post dilated using a 2.75 x 12 mm long [place] balloon 2 inflations at 15 atm.. Balloon inflations reproduced the symptom that has been causing increasing use of nitroglycerin and awakening her from sleep.  After successful LAD PCI, we turned attention to the distal RCA. We used the Assist FFR device to assess the distal RCA stenosis. Adenosine was infused and after 2.5 minutes the FFR was 0.9. No further intervention on the distal RCA was felt necessary.  The case was terminated. Hemostasis was achieved with a wrist band at 13 cc of air.   CONTRAST:  Total of 300 cc.  COMPLICATIONS:  None   HEMODYNAMICS:  Aortic pressure 119/66 mmHg; LV pressure 118 over 11 mmHg; LVEDP 13 mmHg  ANGIOGRAPHIC DATA:   The left main coronary artery is short and contains no significant obstruction.  The left anterior descending artery is diffusely disease starting at the ostium and extending into the mid vessel. There is eccentric 30-40% ostial narrowing. Segmental 50% LAD narrowing. The mid vessel contains a bifurcation lesion, Medina 0, 1, 1 classification with 80% diagonal narrowing and 90% LAD narrowing. The LAD stops at the left ventricular apex..  The left circumflex artery is patent and gives origin to 2 obtuse marginal branches. The first obtuse marginal contains diffuse  proximal narrowing up to 50%. The mid circumflex contains diffuse 50% narrowing before the origin of the second obtuse marginal.  The right coronary artery is dominant. A contains irregularities from proximal to distal. There is progression of a distal lesion that is now eccentrically obstructed in the 70-80% range compared to the prior angiogram. The PDA is diffusely  disease.[name]  PCI RESULTS: Provisional stenting of the LAD bifurcation lesion following scoring balloon angioplasty of the ostium of the diagonal and the LAD lesion led to a very nice result was 0% stenosis in the LAD and a widely patent diagonal branch despite being jailed by the stent. TIMI grade 3 flow was noted. Final balloon diameter with postdilatation was 2.75 mm.  FFR performed on the progression of RCA distal lesion was noncritical with a value of 0.9.  LEFT VENTRICULOGRAM:  Left ventricular angiogram was done in the 30 RAO projection and revealed overall normal function with EF of 70%   IMPRESSIONS:  1. Successful bifurcation stenting in the mid LAD with reduction in 90% LAD and 80% diagonal stenosis to 0% and 50% respectively. The diagonal branch was not stented. 2. Diffuse RCA disease with 50-70% distal narrowing and diffuse PDA involvement. 3. Diffuse proximal LAD disease with 30-50% narrowing from ostium to mid vessel. 4. Moderate mid circumflex and first marginal diffuse disease but less than 50% obstructed. 5. Normal LV function   RECOMMENDATION:  Dual antiplatelet therapy with Plavix. Check P2Y12, and switched to Brilinta if P2Y12 is not therapeutic in a.m. Decreased intensity of medication now that the LAD lesion is been treated Aggressive risk factor modification Home in a.m.

## 2014-01-08 NOTE — Plan of Care (Signed)
Problem: Consults Goal: Cardiac Cath Patient Education (See Patient Education module for education specifics.) Outcome: Completed/Met Date Met:  01/08/14 Goal: Skin Care Protocol Initiated - if Braden Score 18 or less If consults are not indicated, leave blank or document N/A Outcome: Completed/Met Date Met:  01/08/14  Problem: Phase I Progression Outcomes Goal: Pain controlled with appropriate interventions Outcome: Completed/Met Date Met:  01/08/14 Goal: Voiding-avoid urinary catheter unless indicated Outcome: Completed/Met Date Met:  01/08/14 Goal: Hemodynamically stable Outcome: Completed/Met Date Met:  01/08/14 Goal: Distal pulses equal to baseline Outcome: Completed/Met Date Met:  01/08/14 Goal: Vascular site scale level 0 - I Vascular Site Scale Level 0: No bruising/bleeding/hematoma Level I (Mild): Bruising/Ecchymosis, minimal bleeding/ooozing, palpable hematoma < 3 cm Level II (Moderate): Bleeding not affecting hemodynamic parameters, pseudoaneurysm, palpable hematoma > 3 cm Level III (Severe) Bleeding which affects hemodynamic parameters or retroperitoneal hemorrhage  Outcome: Completed/Met Date Met:  01/08/14 Goal: Post Cath/PCI return to appropriate Path Outcome: Completed/Met Date Met:  01/08/14

## 2014-01-09 DIAGNOSIS — Z955 Presence of coronary angioplasty implant and graft: Secondary | ICD-10-CM

## 2014-01-09 DIAGNOSIS — M7989 Other specified soft tissue disorders: Secondary | ICD-10-CM | POA: Diagnosis not present

## 2014-01-09 DIAGNOSIS — I10 Essential (primary) hypertension: Secondary | ICD-10-CM | POA: Diagnosis not present

## 2014-01-09 DIAGNOSIS — I25119 Atherosclerotic heart disease of native coronary artery with unspecified angina pectoris: Secondary | ICD-10-CM | POA: Diagnosis not present

## 2014-01-09 DIAGNOSIS — I2510 Atherosclerotic heart disease of native coronary artery without angina pectoris: Secondary | ICD-10-CM

## 2014-01-09 DIAGNOSIS — E785 Hyperlipidemia, unspecified: Secondary | ICD-10-CM | POA: Diagnosis not present

## 2014-01-09 DIAGNOSIS — I25110 Atherosclerotic heart disease of native coronary artery with unstable angina pectoris: Secondary | ICD-10-CM

## 2014-01-09 DIAGNOSIS — Z9861 Coronary angioplasty status: Secondary | ICD-10-CM

## 2014-01-09 LAB — CBC
HCT: 37.9 % (ref 36.0–46.0)
Hemoglobin: 12.5 g/dL (ref 12.0–15.0)
MCH: 29.8 pg (ref 26.0–34.0)
MCHC: 33 g/dL (ref 30.0–36.0)
MCV: 90.2 fL (ref 78.0–100.0)
PLATELETS: 151 10*3/uL (ref 150–400)
RBC: 4.2 MIL/uL (ref 3.87–5.11)
RDW: 13.9 % (ref 11.5–15.5)
WBC: 5.2 10*3/uL (ref 4.0–10.5)

## 2014-01-09 LAB — BASIC METABOLIC PANEL
Anion gap: 12 (ref 5–15)
BUN: 14 mg/dL (ref 6–23)
CO2: 26 mEq/L (ref 19–32)
Calcium: 9.2 mg/dL (ref 8.4–10.5)
Chloride: 107 mEq/L (ref 96–112)
Creatinine, Ser: 0.79 mg/dL (ref 0.50–1.10)
GFR, EST NON AFRICAN AMERICAN: 88 mL/min — AB (ref >90)
Glucose, Bld: 87 mg/dL (ref 70–99)
POTASSIUM: 4.2 meq/L (ref 3.7–5.3)
Sodium: 145 mEq/L (ref 137–147)

## 2014-01-09 LAB — PLATELET INHIBITION P2Y12: Platelet Function  P2Y12: 128 [PRU] — ABNORMAL LOW (ref 194–418)

## 2014-01-09 MED ORDER — ASPIRIN 81 MG PO CHEW: 81.0000 mg | CHEWABLE_TABLET | Freq: Every day | ORAL | Status: DC

## 2014-01-09 MED ORDER — CLOPIDOGREL BISULFATE 75 MG PO TABS
75.0000 mg | ORAL_TABLET | Freq: Every day | ORAL | Status: DC
Start: 2014-01-09 — End: 2014-04-06

## 2014-01-09 MED FILL — Sodium Chloride IV Soln 0.9%: INTRAVENOUS | Qty: 50 | Status: AC

## 2014-01-09 NOTE — Telephone Encounter (Signed)
New message   appt on  12/11 @ 8:00 am per Almyra Deforest PA.

## 2014-01-09 NOTE — Assessment & Plan Note (Signed)
On Crestor 40 mg 

## 2014-01-09 NOTE — Progress Notes (Signed)
CARDIAC REHAB PHASE I   PRE:  Rate/Rhythm: 76 SR    BP: sitting 158/76    SaO2:   MODE:  Ambulation: 1000 ft   POST:  Rate/Rhythm: 99 SR    BP: sitting 151/74     SaO2:   Tolerated well, no c/o. Ed completed with excellent reception. Thinking about CRPII and will call if she would like to do it. [phone]  Darrick Meigs CES, ACSM 01/09/2014 10:17 AM

## 2014-01-09 NOTE — Assessment & Plan Note (Signed)
ON low dose BB - BP stable.

## 2014-01-09 NOTE — Telephone Encounter (Signed)
Left message to call back  

## 2014-01-09 NOTE — Progress Notes (Signed)
Principal Problem:   Angina, class IV Active Problems:   CAD: mid LAD Medina 0,1,1 lesion progression --> DES PCI; diffuse moderate disease in m-dRCA (FR 0.9)   Presence of drug coated stent in LAD coronary artery: Resolute DES 2.25 mm x 18 mm (2.75 mm)   CAD S/P mLAD Bifurcation PCI: Resolute DES 2.25 mm x 18 mm(2.75 mm), 2.0 mm Angiosculpt of Diag    Essential hypertension   Hyperlipidemia  Cardiac Cath yesterday with progression of LAD-Diag disease, Non-significant FFR of RCA--> PCI of LAD with Cutting balloon PTCA of Diag.  Subjective/Objective Feels fine from a CP / SOB standpoint. R forearm was tight & sore yesterday - had compress & ice on most of the evening - feels much better this AM.  Good pulse (Seen by Dr. Bridgett Larsson of [facility] -- should be OK) P2Y12 Assay this AM 128 = therapeutic.  Scheduled Meds: . aspirin  81 mg Oral Daily  . clopidogrel  75 mg Oral Q breakfast  . famotidine  20 mg Oral BID  . Influenza vac split quadrivalent PF  0.5 mL Intramuscular Once  . isosorbide mononitrate  120 mg Oral Daily  . levothyroxine  125 mcg Oral QAC breakfast  . metoprolol tartrate  12.5 mg Oral Daily  . multivitamin with minerals  1 tablet Oral Daily  . omega-3 acid ethyl esters  1 g Oral Daily  . rosuvastatin  40 mg Oral Daily   Continuous Infusions:  PRN Meds:acetaminophen, ALPRAZolam, cyclobenzaprine, nitroGLYCERIN, ondansetron (ZOFRAN) IV, oxyCODONE-acetaminophen  Vital signs in last 24 hours: Temp:  [97.7 F (36.5 C)-98.9 F (37.2 C)] 98.4 F (36.9 C) (12/04 0406) Pulse Rate:  [60-88] 81 (12/04 0406) Resp:  [18-20] 18 (12/04 0406) BP: (104-143)/(60-95) 135/60 mmHg (12/04 0406) SpO2:  [96 %-100 %] 98 % (12/04 0406) Weight:  [175 lb (79.379 kg)-175 lb 4.3 oz (79.5 kg)] 175 lb 4.3 oz (79.5 kg) (12/04 0016)  Intake/Output last 3 shifts: I/O last 3 completed shifts: In: 1107.4 [P.O.:120; I.V.:987.4] Out: 1350 [Urine:1350] Intake/Output this shift:    General  appearance: alert, cooperative, appears stated age, no distress and mildly obese Neck: no adenopathy, no carotid bruit and no JVD Lungs: clear to auscultation bilaterally and normal percussion bilaterally Heart: regular rate and rhythm, S1, S2 normal, no murmur, click, rub or gallop and normal apical impulse Abdomen: soft, non-tender; bowel sounds normal; no masses,  no organomegaly Extremities: extremities normal, atraumatic, no cyanosis or edema Pulses: 2+ and symmetric R radial Allen's test normal for both Radial & Ulnar flow; R forearm is soft, with some mild swelling & ecchymoses @ elbow.  No longer tender. Neurologic: Grossly normal   Problem Assessment/Plan  Cardiovascular and Mediastinum * Angina, class IV Assessment & Plan Returned for cardiac cath - progression of disease noted. PCI of LAD-Diag. On BB & Imdur. Will need to ambulate this AM -- if stable, ok for d/c  CAD S/P mLAD Bifurcation PCI: Resolute DES 2.25 mm x 18 mm(2.75 mm), 2.0 mm Angiosculpt of Diag  Assessment & Plan DES PCI to LAD - on Plavix. P2Y12 assay therapeutic.  OK for ASA/Plavix. On statin, BB & Imdur.  R forearm with swelling & ecchymoses - most likely mild trauma from Guide catheter advancement or removal.  Much improved this AM.  Normal Radial A flow on Allen's test.  Cleared by Dr. Bridgett Larsson. Will need close f/u next week with Dr. Tamala Julian or APP.  Essential hypertension Assessment & Plan ON low dose BB - BP  stable.  Other Hyperlipidemia Assessment & Plan On Crestor 40 mg     Ok for d/c.  Leonie Man, M.D., M.S. Interventional Cardiologist   Pager # [phone]

## 2014-01-09 NOTE — Discharge Summary (Signed)
Discharge Summary   Patient ID: Veronica [name],  MRN: [id number], DOB/AGE: 08/14/1950 63 y.o.  Admit date: 01/08/2014 Discharge date: 01/09/2014  Primary Care Provider: Gennette Pac Primary Cardiologist: Dr. Tamala Julian  Discharge Diagnoses Principal Problem:   Angina, class IV Active Problems:   Essential hypertension   Hyperlipidemia   CAD: mid LAD Medina 0,1,1 lesion progression --> DES PCI; diffuse moderate disease in m-dRCA (FR 0.9)   Presence of drug coated stent in LAD coronary artery: Resolute DES 2.25 mm x 18 mm (2.75 mm)   CAD S/P mLAD Bifurcation PCI: Resolute DES 2.25 mm x 18 mm(2.75 mm), 2.0 mm Angiosculpt of Diag    Allergies Allergies  Allergen Reactions  . Ace Inhibitors Cough  . Prilosec [Omeprazole Magnesium] Other (See Comments)    Blisters and skin peels off  . Sulfa Antibiotics Other (See Comments)    Blisters and skin peels off  . Ultram [Tramadol Hcl]     Lapse of memory      Procedures  Cardiac catheterization 01/08/2014 PROCEDURE: 1. Left heart catheterization; 2. Coronary angiography; 3. Left atrial artery; 4. FFR distal RCA; 5. DES mid LAD bifurcation  PCI RESULTS: Provisional stenting of the LAD bifurcation lesion following scoring balloon angioplasty of the ostium of the diagonal and the LAD lesion led to a very nice result was 0% stenosis in the LAD and a widely patent diagonal branch despite being jailed by the stent. TIMI grade 3 flow was noted. Final balloon diameter with postdilatation was 2.75 mm.  FFR performed on the progression of RCA distal lesion was noncritical with a value of 0.9.  LEFT VENTRICULOGRAM: Left ventricular angiogram was done in the 30 RAO projection and revealed overall normal function with EF of 70%   IMPRESSIONS: 1. Successful bifurcation stenting in the mid LAD with reduction in 90% LAD and 80% diagonal stenosis to 0% and 50% respectively. The diagonal branch was not stented. 2. Diffuse RCA disease with 50-70%  distal narrowing and diffuse PDA involvement. 3. Diffuse proximal LAD disease with 30-50% narrowing from ostium to mid vessel. 4. Moderate mid circumflex and first marginal diffuse disease but less than 50% obstructed. 5. Normal LV function   RECOMMENDATION: Dual antiplatelet therapy with Plavix. Check P2Y12, and switched to Brilinta if P2Y12 is not therapeutic in a.m. Decreased intensity of medication now that the LAD lesion is been treated Aggressive risk factor modification Home in a.m.    Hospital Course  The patient is a 63 year old Caucasian female with history of hypothyroidism, hypertension, hyperlipidemia, anxiety, and coronary artery disease. She was found to have three-vessel CAD with unstable angina in May 2015. The culprit lesion was felt to be severe mid to distal LAD at the bifurcation with moderate sized diagonal. The diagonal diameter was felt to be too small to stent. Intervention on the LAD would therefore increased the risk of side branch loss. Patient was placed on medical therapy with significantly improved symptom for the past several months. However over the past several weeks, she began to have angina at rest requiring multiple nitroglycerin tablets to resolve. She also has exertional angina as well with episodes lasting longer than 20 minutes. After discussing with the patient multiple treatment modalities, it was decided for the patient to undergo diagnostic cardiac catheterization.   Patient underwent a scheduled elective angiography on 01/08/2014 which showed mid LAD bifurcating lesion with 80% diagonal narrowing and a 90% LAD narrowing, eccentric 30-40% ostial LAD stenosis, 50% mid left circumflex stenosis, 50% proximal OM1 stenosis,  70-80% distal RCA stenosis. The LAD bifurcation was treated with balloon angioplasty and stent placement, after procedure, diagonal branch appears to be widely patent despite being chilled by the stent. Overall his EF appears to be 70%.  P2Y12 was checked and appears to be therapeutic on Plavix. Post cardiac cath, patient had right forearm swelling which she described as discomfort and tightness. He was treated with Ace wrap and ice pack with improvement of swelling. Vascular surgery was consulted, it does not appear patient has risk of compartment syndrome at this time. According to Dr. Bridgett Larsson of vascular surgery, patient has intact hand grip with normal sensation, no further workup is required.   Patient was seen the morning of 01/09/2014, at which time she denies any significant chest discomfort or shortness of breath. Her right arm swelling appears to be stable, she was seen by both cardiology and the vascular team. She is deemed stable for discharge. Given her recent right arm swelling post cath, I will arrange for transition of care follow-up within 7 days to make sure there is no development of compartment syndrome. Patient has been educated on symptom of compartment syndrome and she should seek medical attention if she does have significant worsening of right upper extremity swelling despite cooling therapy with ice.   Discharge Vitals Blood pressure 138/80, pulse 83, temperature 97.8 F (36.6 C), temperature source Oral, resp. rate 18, height 5\' 4"  (1.626 m), weight 175 lb 4.3 oz (79.5 kg), last menstrual period 02/06/2010, SpO2 97 %.  Filed Weights   01/08/14 0816 01/09/14 0016  Weight: 175 lb (79.379 kg) 175 lb 4.3 oz (79.5 kg)   PHYSICAL EXAM  General: Pleasant, NAD. Neuro: Alert and oriented X 3. Moves all extremities spontaneously. Psych: Normal affect. HEENT: Normal Neck: Supple without bruits or JVD. Lungs: Resp regular and unlabored, CTA. Heart: RRR no s3, s4, or murmurs. R radial cath stable. R arm swelling stable, + radial pulse. No significant skin turgor Abdomen: Soft, non-tender, non-distended, BS + x 4.  Extremities: No clubbing, cyanosis or edema. DP/PT/Radials 2+ and equal  bilaterally.  Labs  CBC  Recent Labs  01/07/14 1212 01/09/14 0620  WBC 5.8 5.2  NEUTROABS 4.4  --   HGB 14.0 12.5  HCT 41.7 37.9  MCV 89.5 90.2  PLT 210.0 992   Basic Metabolic Panel  Recent Labs  01/07/14 1212 01/09/14 0620  NA 134* 145  K 3.6 4.2  CL 103 107  CO2 26 26  GLUCOSE 99 87  BUN 22 14  CREATININE 0.8 0.79  CALCIUM 9.5 9.2    Disposition  Pt is being discharged home today in good condition.  Follow-up Plans & Appointments      Follow-up Information    Follow up with Sinclair Grooms, MD.   Specialty:  Cardiology   Why:  Office will contact you to schedule transition of care followup within 7 days. Please call us, if you do not hear from [place] by next Monday   Contact information:   [address] [phone]       Discharge Medications    Medication List    TAKE these medications        ALPRAZolam 0.5 MG tablet  Commonly known as:  XANAX  Take 0.5 mg by mouth at bedtime as needed. For sleep     aspirin 81 MG chewable tablet  Chew 1 tablet (81 mg total) by mouth daily.     augmented betamethasone  dipropionate 0.05 % cream  Commonly known as:  DIPROLENE-AF  Apply 1 application topically daily as needed (for skin irritation).     clopidogrel 75 MG tablet  Commonly known as:  PLAVIX  Take 1 tablet (75 mg total) by mouth daily with breakfast.     cyclobenzaprine 10 MG tablet  Commonly known as:  FLEXERIL  Take 10 mg by mouth 3 (three) times daily as needed for muscle spasms.     estradiol 0.1 MG/GM vaginal cream  Commonly known as:  ESTRACE  1 gram vaginally twice weekly     famotidine 20 MG tablet  Commonly known as:  PEPCID  Take 20 mg by mouth 2 (two) times daily.     fish oil-omega-3 fatty acids 1000 MG capsule  Take 1 g by mouth daily.     FLUARIX QUADRIVALENT 0.5 ML injection  Generic drug:  Influenza vac split quadrivalent PF  Inject 0.5 mLs into the muscle once.      halobetasol 0.05 % cream  Commonly known as:  ULTRAVATE  Apply 1 application topically daily as needed (for skin irritation).     hydrocortisone valerate cream 0.2 %  Commonly known as:  WESTCORT  Apply 1 application topically 2 (two) times daily as needed (rash).     isosorbide mononitrate 120 MG 24 hr tablet  Commonly known as:  IMDUR  Take 1 tablet (120 mg total) by mouth daily.     levothyroxine 125 MCG tablet  Commonly known as:  SYNTHROID, LEVOTHROID  Take 125 mcg by mouth daily.     metoprolol tartrate 25 MG tablet  Commonly known as:  LOPRESSOR  Take 12.5 mg by mouth daily.     multivitamin with minerals Tabs tablet  Take 1 tablet by mouth daily.     nitroGLYCERIN 0.4 MG SL tablet  Commonly known as:  NITROSTAT  Place 1 tablet (0.4 mg total) under the tongue every 5 (five) minutes as needed for chest pain.     rosuvastatin 40 MG tablet  Commonly known as:  CRESTOR  Take 40 mg by mouth daily.     sertraline 50 MG tablet  Commonly known as:  ZOLOFT  Take 25 mg by mouth daily.         Duration of Discharge Encounter   Greater than 30 minutes including physician time.  Hilbert Corrigan PA-C Pager: [phone] 01/09/2014, 10:39 AM   I personally saw & examined the patient on the day of discharge. See Progress note for final plan.   Cardiovascular and Mediastinum * Angina, class IV Assessment & Plan Returned for cardiac cath - progression of disease noted. PCI of LAD-Diag. On BB & Imdur. Will need to ambulate this AM -- if stable, ok for d/c  CAD S/P mLAD Bifurcation PCI: Resolute DES 2.25 mm x 18 mm(2.75 mm), 2.0 mm Angiosculpt of Diag  Assessment & Plan DES PCI to LAD - on Plavix. P2Y12 assay therapeutic. OK for ASA/Plavix. On statin, BB & Imdur.  R forearm with swelling & ecchymoses - most likely mild trauma from Guide catheter advancement or removal. Much improved this AM. Normal Radial A flow on Allen's test. Cleared by Dr. Bridgett Larsson. Will need close  f/u next week with Dr. Tamala Julian or APP.  Essential hypertension Assessment & Plan ON low dose BB - BP stable.  Other Hyperlipidemia Assessment & Plan On Crestor 40 mg     Ok for d/c.  Leonie Man, M.D., M.S. Interventional Cardiologist   Pager # [phone]

## 2014-01-09 NOTE — Discharge Instructions (Signed)
Angina Pectoris Angina pectoris is extreme discomfort in your chest, neck, or arm. Your doctor may call it just angina. It is caused by a lack of oxygen to your heart wall. It may feel like tightness or heavy pressure. It may feel like a crushing or squeezing pain. Some people say it feels like gas. It may go down your shoulders, back, and arms. Some people have symptoms other than pain. These include:  Tiredness.  Shortness of breath.  Cold sweats.  Feeling sick to your stomach (nausea). There are four types of angina:  Stable angina. This type often lasts the same amount of time each time it happens. Activity, stress, or excitement can bring it on. It often gets better after taking a medicine called nitroglycerin. This goes under your tongue.  Unstable angina. This type can happen when you are not active or even during sleep. It can suddenly get worse or happen more often. It may not get better after taking the special medicine. It can last up to 30 minutes.  Microvascular angina. This type is more common in women. It may be more severe or last longer than other types.  Prinzmetal angina. This type often happens when you are not active or in the early morning hours. HOME CARE   Only take medicines as told by your doctor.  Stay active or exercise more as told by your doctor.  Limit very hard activity as told by your doctor.  Limit heavy lifting as told by your doctor.  Keep a healthy weight.  Learn about and eat foods that are healthy for your heart.  Do not use any tobacco such as cigarettes, chewing tobacco, or e-cigarettes. GET HELP RIGHT AWAY IF:   You have chest, neck, deep shoulder, or arm pain or discomfort that lasts more than a few minutes.  You have chest, neck, deep shoulder, or arm pain or discomfort that goes away and comes back over and over again.  You have heavy sweating that seems to happen for no reason.  You have shortness of breath or trouble  breathing.  Your angina does not get better after a few minutes of rest.  Your angina does not get better after you take nitroglycerin medicine. These can all be symptoms of a heart attack. Get help right away. Call your local emergency service (911 in [place] Do not  drive yourself to the hospital. Do not  wait to for your symptoms to go away. MAKE SURE YOU:   Understand these instructions.  Will watch your condition.  Will get help right away if you are not doing well or get worse. Document Released: 07/12/2007 Document Revised: 01/28/2013 Document Reviewed: 05/27/2013 [facility] Patient Information 2015 [place], [place]. This information is not intended to replace advice given to you by your health care provider. Make sure you discuss any questions you have with your health care provider.  No driving for 24 hours. No lifting over 5 lbs for 1 week. No sexual activity for 1 week. Keep procedure site clean & dry. If you notice increased pain, swelling, bleeding or pus, call/return!  You may shower, but no soaking baths/hot tubs/pools for 1 week.   If you have worsening R arm swelling, use ICE. However if start of have some R arm pain, loss of pulse on R side, R sided worsening redness or palor, please call us or seek medical attention.

## 2014-01-09 NOTE — Assessment & Plan Note (Addendum)
DES PCI to LAD - on Plavix. P2Y12 assay therapeutic.  OK for ASA/Plavix. On statin, BB & Imdur.  R forearm with swelling & ecchymoses - most likely mild trauma from Guide catheter advancement or removal.  Much improved this AM.  Normal Radial A flow on Allen's test.  Cleared by Dr. Bridgett Larsson. Will need close f/u next week with Dr. Tamala Julian or APP.

## 2014-01-09 NOTE — Assessment & Plan Note (Addendum)
Returned for cardiac cath - progression of disease noted. PCI of LAD-Diag. On BB & Imdur. Will need to ambulate this AM -- if stable, ok for d/c

## 2014-01-09 NOTE — Progress Notes (Addendum)
   Daily Progress Note  Assessment/Planning: POD #1 s/p PCI   No significant change from yesterday  Doubt will need anything done surgically  Heating pad and elevation  Subjective  - 1 Day Post-Op  Mild pain in R forearm  Objective Filed Vitals:   01/08/14 2016 01/09/14 0016 01/09/14 0406 01/09/14 0800  BP: 108/65 130/70 135/60 138/80  Pulse: 85 88 81 83  Temp: 97.9 F (36.6 C) 98.5 F (36.9 C) 98.4 F (36.9 C) 97.8 F (36.6 C)  TempSrc: Oral Oral Oral Oral  Resp: 18 18 18 18   Height:      Weight:  175 lb 4.3 oz (79.5 kg)    SpO2: 100% 99% 98% 97%    Intake/Output Summary (Last 24 hours) at 01/09/14 0828 Last data filed at 01/09/14 0827  Gross per 24 hour  Intake 1467.38 ml  Output   1350 ml  Net 117.38 ml    VASC  R forearm unchanged, DNVI , echymosis in antecubitum  Laboratory CBC    Component Value Date/Time   WBC 5.2 01/09/2014 0620   HGB 12.5 01/09/2014 0620   HCT 37.9 01/09/2014 0620   PLT 151 01/09/2014 0620    BMET    Component Value Date/Time   NA 145 01/09/2014 0620   K 4.2 01/09/2014 0620   CL 107 01/09/2014 0620   CO2 26 01/09/2014 0620   GLUCOSE 87 01/09/2014 0620   BUN 14 01/09/2014 0620   CREATININE 0.79 01/09/2014 0620   CALCIUM 9.2 01/09/2014 0620   GFRNONAA 88* 01/09/2014 0620   GFRAA >90 01/09/2014 6063    Adele Barthel, MD [facility]: [phone] Pager: [phone]  01/09/2014, 8:28 AM

## 2014-01-12 NOTE — Telephone Encounter (Signed)
lmtcb

## 2014-01-12 NOTE — Telephone Encounter (Signed)
Left message to call back  

## 2014-01-13 ENCOUNTER — Encounter: Payer: Self-pay | Admitting: Interventional Cardiology

## 2014-01-13 NOTE — Telephone Encounter (Signed)
F/u ° ° °Pt returning call from nurse. °

## 2014-01-13 NOTE — Telephone Encounter (Signed)
Returned pt call. Pt aware of post hosp appt.with Dr.Smith on 12/10 @ 3pm. Pt verbalized understanding.

## 2014-01-15 ENCOUNTER — Ambulatory Visit (INDEPENDENT_AMBULATORY_CARE_PROVIDER_SITE_OTHER): Payer: 59 | Admitting: Interventional Cardiology

## 2014-01-15 ENCOUNTER — Encounter (HOSPITAL_COMMUNITY): Payer: Self-pay | Admitting: Interventional Cardiology

## 2014-01-15 VITALS — BP 138/78 | HR 90 | Ht 63.75 in | Wt 176.4 lb

## 2014-01-15 DIAGNOSIS — I25110 Atherosclerotic heart disease of native coronary artery with unstable angina pectoris: Secondary | ICD-10-CM

## 2014-01-15 DIAGNOSIS — E785 Hyperlipidemia, unspecified: Secondary | ICD-10-CM

## 2014-01-15 DIAGNOSIS — I10 Essential (primary) hypertension: Secondary | ICD-10-CM

## 2014-01-15 DIAGNOSIS — S40021D Contusion of right upper arm, subsequent encounter: Secondary | ICD-10-CM

## 2014-01-15 DIAGNOSIS — S40021A Contusion of right upper arm, initial encounter: Secondary | ICD-10-CM | POA: Insufficient documentation

## 2014-01-15 MED ORDER — EZETIMIBE 10 MG PO TABS: 10.0000 mg | ORAL_TABLET | Freq: Every day | ORAL | Status: DC

## 2014-01-15 NOTE — Patient Instructions (Addendum)
Your physician has recommended you make the following change in your medication:  1) DECREASE Isosorbide to 1/2 tablet for 1 week then STOP 2) START Zetia 10mg  daily, an Rx has been sent to your pharmacy  After stopping Isosorbide monitor your blood pressure regularly. Call the office if your bp is consistently above 140/90  Your physician recommends that you return to your pcp for a FASTING lipid profile and liver function panel in 2-3 months. Please have results forwarded to our office  Your physician wants you to follow-up in:4-13months with Dr.Smith You will receive a reminder letter in the mail two months in advance. If you don't receive a letter, please call our office to schedule the follow-up appointment.

## 2014-01-15 NOTE — Progress Notes (Signed)
Patient ID: Veronica [name], female   DOB: March 02, 1950, 63 y.o.   MRN: [id number]    [address], [place]  [address] Phone: [phone] Fax:  [phone]  Date:  01/15/2014   ID:  Veronica [name], DOB 11-Jan-1951, MRN [id number]  PCP:  Gennette Pac, MD   ASSESSMENT:  1. Coronary artery disease with markedly improved reductions in symptoms after LAD bifurcation stent. 2. Ecchymosis right forearm and upper arm secondary to hematoma area resolving. 3. Hyperlipidemia with LDL cholesterol 112 on 40 mg of Crestor  PLAN:   1. Zetia 10 mg per day 2. Lipid panel and liver panel in 2-3 months with Dr. Lennette Bihari little 3. Clinical follow-up with me in 4-6 months 4. Discontinue Imdur 5. Monitor blood pressure after Imdur is discontinued. We may need to resume Diovan.    SUBJECTIVE: Veronica [name] is a 63 y.o. female who is markedly improved. She has had no chest discomfort or needed sublingual nitroglycerin since PCI. She did develop a neck and also says suspected in the right arm. She had a small microperforation in her radial/brachial artery system during the procedure and had development of a hematoma one hour post procedure. She has no motor deficit or numbness. Radial pulses 2+ and symmetric.    Wt Readings from Last 3 Encounters:  01/15/14 176 lb 6.4 oz (80.015 kg)  01/09/14 175 lb 4.3 oz (79.5 kg)  10/16/13 169 lb (76.658 kg)     Past Medical History  Diagnosis Date  . Hypothyroidism   . Essential hypertension     stress test 10 yrs ago.   pcp   dr Lennette Bihari little  . Hyperlipemia   . Osteopenia   . Complication of anesthesia     extreme sore throat and hoarseness after aneathesia, also "woke up screamimg" after surgery once"  . 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 Angina, class IV 01/2014  . CAD S/P mLAD Bifurcation  PCI: Resolute DES 2.25 mm x 18 mm(2.75 mm), 2.0 mm Angiosculpt of Diag  01/08/2014    [place] 0,1,1 midLAD-Diag lesion - progressed from Moderate to Severe 90% LAD, 70-80% Diag from 6-12 2015 --> Class IV Angina --> DES PCI with Angiosculpt PCI of Diag  FFR of m-dRCA 50-70% = 0.9   . Hyperthyroidism     "had radioactive iodine tx in the 1980's"  . History of migraine headaches     "as a child"  . Osteoarthritis     "joints ache"  . Anxiety     "menopausal related"    Current Outpatient Prescriptions  Medication Sig Dispense Refill  . ALPRAZolam (XANAX) 0.5 MG tablet Take 0.5 mg by mouth at bedtime as needed. For sleep    . aspirin 81 MG chewable tablet Chew 1 tablet (81 mg total) by mouth daily.    [name] augmented betamethasone dipropionate (DIPROLENE-AF) 0.05 % cream Apply 1 application topically daily as needed (for skin irritation).     . clopidogrel (PLAVIX) 75 MG tablet Take 1 tablet (75 mg total) by mouth daily with breakfast. 30 tablet 11  . cyclobenzaprine (FLEXERIL) 10 MG tablet Take 10 mg by mouth 3 (three) times daily as needed for muscle spasms.   0  . estradiol (ESTRACE) 0.1 MG/GM vaginal cream 1 gram vaginally twice weekly 42.5 g 1  . famotidine (PEPCID) 20 MG  tablet Take 20 mg by mouth 2 (two) times daily.    . fish oil-omega-3 fatty acids 1000 MG capsule Take 1 g by mouth daily.    [name] FLUARIX QUADRIVALENT 0.5 ML injection Inject 0.5 mLs into the muscle once.   0  . halobetasol (ULTRAVATE) 0.05 % cream Apply 1 application topically daily as needed (for skin irritation).     . hydrocortisone valerate cream (WESTCORT) 0.2 % Apply 1 application topically 2 (two) times daily as needed (rash).     . isosorbide mononitrate (IMDUR) 120 MG 24 hr tablet Take 1 tablet (120 mg total) by mouth daily. 30 tablet 11  . levothyroxine (SYNTHROID, LEVOTHROID) 125 MCG tablet Take 125 mcg by mouth daily.    . metoprolol tartrate (LOPRESSOR) 25 MG tablet Take 12.5 mg by mouth daily.    . Multiple  Vitamin (MULITIVITAMIN WITH MINERALS) TABS Take 1 tablet by mouth daily.    . nitroGLYCERIN (NITROSTAT) 0.4 MG SL tablet Place 1 tablet (0.4 mg total) under the tongue every 5 (five) minutes as needed for chest pain. 25 tablet 3  . rosuvastatin (CRESTOR) 40 MG tablet Take 40 mg by mouth daily.    . sertraline (ZOLOFT) 50 MG tablet Take 25 mg by mouth daily.     No current facility-administered medications for this visit.    Allergies:    Allergies  Allergen Reactions  . Ace Inhibitors Cough  . Prilosec [Omeprazole Magnesium] Other (See Comments)    Blisters and skin peels off  . Sulfa Antibiotics Other (See Comments)    Blisters and skin peels off  . Ultram [Tramadol Hcl]     Lapse of memory      Social History:  The patient  reports that she has never smoked. She has never used smokeless tobacco. She reports that she does not drink alcohol or use illicit drugs.   ROS:  Please see the history of present illness.   Has not used sublingual nitroglycerin.   All other systems reviewed and negative.   OBJECTIVE: VS:  BP 138/78 mmHg  Pulse 90  Ht 5' 3.75" (1.619 m)  Wt 176 lb 6.4 oz (80.015 kg)  BMI 30.53 kg/m2  SpO2 97%  LMP 02/06/2010 Well nourished, well developed, in no acute distress, obese HEENT: normal Neck: JVD flat. Carotid bruit absent  Cardiac:  normal S1, S2; RRR; no murmur Lungs:  clear to auscultation bilaterally, no wheezing, rhonchi or rales Abd: soft, nontender, no hepatomegaly Ext: Edema none. Pulses none. Ecchymoses noted in the forearm and upper portion of the right arm. No hematomas seen. Skin: warm and dry Neuro:  CNs 2-12 intact, no focal abnormalities noted  EKG:  Not repeated       Signed, Illene Labrador III, MD 01/15/2014 2:59 PM

## 2014-01-16 ENCOUNTER — Encounter: Payer: 59 | Admitting: Physician Assistant

## 2014-01-22 ENCOUNTER — Telehealth: Payer: Self-pay

## 2014-01-22 NOTE — Telephone Encounter (Signed)
F/U     Pt returning call , may contact at [phone] for the rest of the day.

## 2014-01-22 NOTE — Telephone Encounter (Signed)
Pt aware of Dr.Smith's recommendation.The bad cholesterol is still 50 points above where the target suggests we should be. I recommend that she go to the lipid clinic and be considered for a PSK-9 Adv her a scheduler from our office will call her to schedule

## 2014-01-22 NOTE — Telephone Encounter (Signed)
-----   Message from [place], MD sent at 01/21/2014  8:03 PM EST ----- The bad cholesterol is still 50 points above where the target suggests we should be. I recommend that she go to the lipid clinic and be considered for a PSK-9

## 2014-01-22 NOTE — Telephone Encounter (Signed)
called to give pt Dr.Smith recommendation on scanned labs received from [email or web address].lmtcb

## 2014-01-22 NOTE — Telephone Encounter (Signed)
Pt agreeable with plan

## 2014-02-03 ENCOUNTER — Encounter: Payer: Self-pay | Admitting: Physician Assistant

## 2014-02-09 ENCOUNTER — Ambulatory Visit (INDEPENDENT_AMBULATORY_CARE_PROVIDER_SITE_OTHER): Payer: 59 | Admitting: Pharmacist

## 2014-02-09 ENCOUNTER — Encounter: Payer: Self-pay | Admitting: Pharmacist

## 2014-02-09 VITALS — Wt 178.5 lb

## 2014-02-09 DIAGNOSIS — E785 Hyperlipidemia, unspecified: Secondary | ICD-10-CM

## 2014-02-09 NOTE — Progress Notes (Signed)
S/O: Veronica [name] is a pleasant 64yo female referred to lipid clinic by Dr. Tamala Julian.  [name] has a h/o CAD (s/p PCI), unstable angina, and elevated LDL despite max dose Crestor.  Pt was initiated on Zetia 10mg  daily ~4wk ago when lipids were checked in 01/2014.  She reports some very mild aches/pains since starting Zetia, but she is unsure if the two are related - she indicates that she will continue with the Zetia for now.  Lipid panel 10/2013 on Lipitor 80mg  daily: TC 187 TG 127 HDL 47 LDL 114  Lipid panel 01/2014 on Crestor 40mg  daily: TC 199 TG 126 HDL 53 LDL 121 (target <70)  Diet: Only drinks water (no sweet tea, sodas, etc); low-fat and low-carb diet    Breakfast - yogurt, fruit, granola, wheat toast, eggs 1x/week    Lunch - salad or sandwich with wheat bread, lean meats, veggies    Dinner - typically eats at home, salads, lean meats, veggies    Snacks - yogurt, fruit, veggies, crackers with hummus  Exercise: Walks 1hr daily, gym most days x 1hr ([employer] classes +/- weights)  A/P: [name] is highly motivated to do what she can to maintain a healthy lifestyle and decrease her risk for cardiac events.  She is currently on Crestor 40mg  daily, Zetia 10mg  daily (started ~4 weeks ago), and fish oil 1g daily.  Despite max dose Crestor, her LDL remains significantly higher than her target goal of <70.  Check fasting lipid panel and liver function in 7 weeks (~17mo after starting Zetia) F/U lipid clinic visit in 8 weeks Will consider addition of PCSK-9 inhibitor at that time if necessary to get to LDL goal of < 70.  Drucie Opitz, PharmD Clinical Pharmacy Resident Pager: [phone] 02/09/2014 4:42 PM

## 2014-02-09 NOTE — Patient Instructions (Addendum)
It was nice to meet you today.  Continue taking your medicines as directed including the Crestor, Zetia, and fish oil.  1) We will check your fasting cholesterol panel and liver function tests in 7 weeks 2) Return to lipid clinic in 8 weeks  Let us know if you seem to have any problems with any of your medicines or if you have any questions.

## 2014-02-17 NOTE — Addendum Note (Signed)
Addended by: Elberta Leatherwood R on: 02/17/2014 01:20 PM   Modules accepted: Orders

## 2014-03-01 ENCOUNTER — Other Ambulatory Visit: Payer: Self-pay | Admitting: Interventional Cardiology

## 2014-03-04 ENCOUNTER — Encounter: Payer: Self-pay | Admitting: Interventional Cardiology

## 2014-03-08 ENCOUNTER — Other Ambulatory Visit: Payer: Self-pay | Admitting: Interventional Cardiology

## 2014-03-22 ENCOUNTER — Other Ambulatory Visit: Payer: Self-pay | Admitting: Interventional Cardiology

## 2014-03-30 ENCOUNTER — Other Ambulatory Visit (INDEPENDENT_AMBULATORY_CARE_PROVIDER_SITE_OTHER): Payer: 59 | Admitting: *Deleted

## 2014-03-30 DIAGNOSIS — E785 Hyperlipidemia, unspecified: Secondary | ICD-10-CM

## 2014-03-30 LAB — LIPID PANEL
CHOL/HDL RATIO: 3
Cholesterol: 154 mg/dL (ref 0–200)
HDL: 50.4 mg/dL (ref >39.00)
LDL CALC: 83 mg/dL (ref 0–99)
NONHDL: 103.6
Triglycerides: 104 mg/dL (ref 0.0–149.0)
VLDL: 20.8 mg/dL (ref 0.0–40.0)

## 2014-03-30 LAB — HEPATIC FUNCTION PANEL
ALT: 27 U/L (ref 0–35)
AST: 21 U/L (ref 0–37)
Albumin: 4.4 g/dL (ref 3.5–5.2)
Alkaline Phosphatase: 116 U/L (ref 39–117)
BILIRUBIN DIRECT: 0.1 mg/dL (ref 0.0–0.3)
BILIRUBIN TOTAL: 0.5 mg/dL (ref 0.2–1.2)
Total Protein: 7.2 g/dL (ref 6.0–8.3)

## 2014-03-31 ENCOUNTER — Telehealth: Payer: Self-pay

## 2014-03-31 NOTE — Telephone Encounter (Signed)
-----   Message from Sinclair Grooms, MD sent at 03/30/2014  7:15 PM EST ----- Very good. Diet and exercise. F/u in 6 months

## 2014-03-31 NOTE — Telephone Encounter (Signed)
Pt aware of lab results with verbal understanding.Very good. Diet and exercise. F/u in 6 months

## 2014-04-03 ENCOUNTER — Telehealth: Payer: Self-pay | Admitting: Interventional Cardiology

## 2014-04-03 NOTE — Telephone Encounter (Signed)
Follow up     FYI TSH .12----Dr Hulan Fess is adjusting thyroid medication.  You do not have to call her back, she wanted this test results in her chart.  She is coming in on 04-06-14 to see Gay Filler in the lipid clinic

## 2014-04-03 NOTE — Telephone Encounter (Signed)
New message  Pt called states that she would like to give lab report from recent testing. Was advised per Dr. Thompson Caul nurse to have the labs faxed in so that they can scan them in her chart. Pt declined and said that she would bring them in.

## 2014-04-06 ENCOUNTER — Ambulatory Visit (INDEPENDENT_AMBULATORY_CARE_PROVIDER_SITE_OTHER): Payer: 59 | Admitting: Pharmacist

## 2014-04-06 DIAGNOSIS — E785 Hyperlipidemia, unspecified: Secondary | ICD-10-CM

## 2014-04-06 NOTE — Patient Instructions (Signed)
Your cholesterol looks great.  If you have problems with the Crestor, you can try to alternate 1/2 tablet and 1 tablet to find the dose that is the most tolerable.   We will recheck your labs in 6 months.

## 2014-04-06 NOTE — Progress Notes (Signed)
S/O: Veronica [name] is a pleasant 64yo female referred to lipid clinic by Dr. Tamala Julian.  [name] has a h/o CAD (s/p PCI), unstable angina, and elevated LDL despite max dose Crestor.  Pt was initiated on Zetia 10mg  daily when lipids were checked in 01/2014.  She reports doing well on Crestor/Zetia combination.  She does have some leg pains but not as severe as Lipitor and states they get better with exercise.   Pt continues on her low-fat, low-carb diet.  She states she does have some difficulty maintaining her diet but allows herself a few cheat days a month to help maintain the changes.   She continues to walk the dog ~ 1 hr a day and goes to the gym for another hour a day when possible.   RF: ASCVD Goals: LDL <70, non-HDL <100 Current Meds: Crestor 40mg  daily, Zetia 10mg  daily Intolerances: Lipitor 80mg  (myalgias)   Labs:  10/2013:  TC 187, TG 127, HDL 47, LDL 114 (Lipitor 80mg ) 01/2014: TC 199, TG 126, HDL 53, LDL 121 (Crestor 40mg ) 03/2014: TC 154, TG 104, HDL 50, LDL 83 (Crestor 40mg  and Zetia 10mg  daily)  Current Outpatient Prescriptions  Medication Sig Dispense Refill  . clopidogrel (PLAVIX) 75 MG tablet Take 75 mg by mouth daily.    [name] levothyroxine (SYNTHROID, LEVOTHROID) 112 MCG tablet Take 112 mcg by mouth daily before breakfast.    . ALPRAZolam (XANAX) 0.5 MG tablet Take 0.5 mg by mouth at bedtime as needed. For sleep    . aspirin 81 MG chewable tablet Chew 1 tablet (81 mg total) by mouth daily.    [name] augmented betamethasone dipropionate (DIPROLENE-AF) 0.05 % cream Apply 1 application topically daily as needed (for skin irritation).     . cyclobenzaprine (FLEXERIL) 10 MG tablet Take 10 mg by mouth 3 (three) times daily as needed for muscle spasms.   0  . estradiol (ESTRACE) 0.1 MG/GM vaginal cream 1 gram vaginally twice weekly 42.5 g 1  . ezetimibe (ZETIA) 10 MG tablet Take 1 tablet (10 mg total) by mouth daily. 30 tablet 11  . famotidine (PEPCID) 20 MG tablet Take 20 mg by mouth 2 (two)  times daily.    . fish oil-omega-3 fatty acids 1000 MG capsule Take 1 g by mouth daily.    [name] FLUARIX QUADRIVALENT 0.5 ML injection Inject 0.5 mLs into the muscle once.   0  . halobetasol (ULTRAVATE) 0.05 % cream Apply 1 application topically daily as needed (for skin irritation).     . hydrocortisone valerate cream (WESTCORT) 0.2 % Apply 1 application topically 2 (two) times daily as needed (rash).     . metoprolol tartrate (LOPRESSOR) 25 MG tablet TAKE 1/2 TABLET BY MOUTH DAILY 15 tablet 6  . Multiple Vitamin (MULITIVITAMIN WITH MINERALS) TABS Take 1 tablet by mouth daily.    . nitroGLYCERIN (NITROSTAT) 0.4 MG SL tablet Place 1 tablet (0.4 mg total) under the tongue every 5 (five) minutes as needed for chest pain. 25 tablet 3  . rosuvastatin (CRESTOR) 40 MG tablet Take 40 mg by mouth daily.    . sertraline (ZOLOFT) 50 MG tablet Take 25 mg by mouth daily.     No current facility-administered medications for this visit.

## 2014-04-06 NOTE — Assessment & Plan Note (Signed)
Pt's cholesterol much improved with addition of Zetia.  LDL improved ~ 40 pts.  She is close to goal of <70 and on max dose statin and Zetia.  Discussed outcomes data and need to chase a number versus prescribe medications with positive outcomes.  There is currently no outcomes data with PCSK-9 so would hold off for now given improvement in LDL.  Pt is agreeable.  She did report some muscle aches with Crestor but states it is tolerable for now.  If it becomes intolerable, asked pt to try to titrate between 20 and 40mg  daily to find the most tolerable dose.  Will check labs in 6 months and call pt with results.  If LDL remains close to goal, will have her follow up with Dr. Tamala Julian for future management.

## 2014-05-06 ENCOUNTER — Encounter: Payer: Self-pay | Admitting: Interventional Cardiology

## 2014-05-06 ENCOUNTER — Ambulatory Visit (INDEPENDENT_AMBULATORY_CARE_PROVIDER_SITE_OTHER): Payer: 59 | Admitting: Interventional Cardiology

## 2014-05-06 VITALS — BP 122/72 | HR 81 | Ht 64.0 in | Wt 179.6 lb

## 2014-05-06 DIAGNOSIS — Z9861 Coronary angioplasty status: Secondary | ICD-10-CM | POA: Diagnosis not present

## 2014-05-06 DIAGNOSIS — I10 Essential (primary) hypertension: Secondary | ICD-10-CM | POA: Diagnosis not present

## 2014-05-06 DIAGNOSIS — I2510 Atherosclerotic heart disease of native coronary artery without angina pectoris: Secondary | ICD-10-CM

## 2014-05-06 DIAGNOSIS — E785 Hyperlipidemia, unspecified: Secondary | ICD-10-CM

## 2014-05-06 NOTE — Patient Instructions (Signed)
Your physician wants you to follow-up in: 8 months with Dr Gaspar Bidding will receive a reminder letter in the mail two months in advance. If you don't receive a letter, please call our office to schedule the follow-up appointment.  Your physician recommends that you continue on your current medications as directed. Please refer to the Current Medication list given to you today.  Keep your lifestyle active!!

## 2014-05-06 NOTE — Progress Notes (Signed)
Cardiology Office Note   Date:  05/06/2014   ID:  Veronica [name], DOB March 13, 1950, MRN [id number]  PCP:  Gennette Pac, MD  Cardiologist:   Sinclair Grooms, MD   No chief complaint on file.     History of Present Illness: Veronica [name] is a 65 y.o. female who presents for follow-up of coronary disease. She had mid LAD diagonal bifurcation treated and has been rendered asymptomatic since that time. No complications with adjuvant medical therapy. No nitroglycerin use.    Past Medical History  Diagnosis Date  . Hypothyroidism   . Essential hypertension     stress test 10 yrs ago.   pcp   dr Lennette Bihari little  . Hyperlipemia   . Osteopenia   . Complication of anesthesia     extreme sore throat and hoarseness after aneathesia, also "woke up screamimg" after surgery once"  . 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 Angina, class IV 01/2014  . CAD S/P mLAD Bifurcation PCI: Resolute DES 2.25 mm x 18 mm(2.75 mm), 2.0 mm Angiosculpt of Diag  01/08/2014    [place] 0,1,1 midLAD-Diag lesion - progressed from Moderate to Severe 90% LAD, 70-80% Diag from 6-12 2015 --> Class IV Angina --> DES PCI with Angiosculpt PCI of Diag  FFR of m-dRCA 50-70% = 0.9   . Hyperthyroidism     "had radioactive iodine tx in the 1980's"  . History of migraine headaches     "as a child"  . Osteoarthritis     "joints ache"  . Anxiety     "menopausal related"    Past Surgical History  Procedure Laterality Date  . Laparoscopic cholecystectomy  4/97  . Wrist fracture surgery Right 1990's  . Dilation and curettage of uterus    . Tonsillectomy    . Posterior cervical fusion/foraminotomy  04/04/2011    Procedure: POSTERIOR CERVICAL FUSION/FORAMINOTOMY LEVEL 1;  Surgeon: Charlie Pitter, MD;  Location: [place] [facility];  Service: Neurosurgery;  Laterality: Left;  Left Cervical  Six-Seven Laminectomy, Foraminotomy, Diskectomy   . Cardiac catheterization  06/2013  . Coronary angioplasty with stent placement  01/08/2014    "1"  . Fracture surgery    . Back surgery    . Breast cyst aspiration Left 04/1999    "done in dr's office"  . Left heart catheterization with coronary angiogram N/A 07/04/2013    Procedure: LEFT HEART CATHETERIZATION WITH CORONARY ANGIOGRAM;  Surgeon: Sinclair Grooms, MD;  Location: [facility] CATH LAB;  Service: Cardiovascular;  Laterality: N/A;  . Left heart catheterization with coronary angiogram N/A 01/08/2014    Procedure: LEFT HEART CATHETERIZATION WITH CORONARY ANGIOGRAM;  Surgeon: Sinclair Grooms, MD;  Location: [facility] CATH LAB;  Service: Cardiovascular;  Laterality: N/A;  . Cardiac catheterization  01/08/2014    Procedure: INTRAVASCULAR PRESSURE WIRE/FFR STUDY;  Surgeon: Sinclair Grooms, MD;  [facility];  Service: Cardiovascular;;  distal RCA  . Percutaneous stent intervention  01/08/2014    Procedure: PERCUTANEOUS STENT INTERVENTION;  Surgeon: Sinclair Grooms, MD;  Location: [facility] CATH LAB;  Service: Cardiovascular;;  MID LAD  . Cardiac catheterization  01/08/2014    Procedure: CORONARY BALLOON ANGIOPLASTY;  Surgeon: Sinclair Grooms, MD;  Location: [facility] CATH LAB;  Service: Cardiovascular;;  diag     Current Outpatient Prescriptions  Medication Sig  Dispense Refill  . ALPRAZolam (XANAX) 0.5 MG tablet Take 0.5 mg by mouth at bedtime as needed. For sleep    . aspirin 81 MG chewable tablet Chew 1 tablet (81 mg total) by mouth daily.    [name] augmented betamethasone dipropionate (DIPROLENE-AF) 0.05 % cream Apply 1 application topically daily as needed (for skin irritation).     . clopidogrel (PLAVIX) 75 MG tablet Take 75 mg by mouth daily.    . cyclobenzaprine (FLEXERIL) 10 MG tablet Take 10 mg by mouth 3 (three) times daily as needed for muscle spasms.   0  . estradiol (ESTRACE) 0.1 MG/GM vaginal cream 1 gram vaginally twice weekly 42.5 g 1  .  ezetimibe (ZETIA) 10 MG tablet Take 1 tablet (10 mg total) by mouth daily. 30 tablet 11  . famotidine (PEPCID) 20 MG tablet Take 20 mg by mouth 2 (two) times daily.    . fish oil-omega-3 fatty acids 1000 MG capsule Take 1 g by mouth daily.    [name] FLUARIX QUADRIVALENT 0.5 ML injection Inject 0.5 mLs into the muscle once.   0  . halobetasol (ULTRAVATE) 0.05 % cream Apply 1 application topically daily as needed (for skin irritation).     . hydrocortisone valerate cream (WESTCORT) 0.2 % Apply 1 application topically 2 (two) times daily as needed (rash).     [name] levothyroxine (SYNTHROID, LEVOTHROID) 100 MCG tablet Take 100 mcg by mouth daily before breakfast.    . metoprolol tartrate (LOPRESSOR) 25 MG tablet TAKE 1/2 TABLET BY MOUTH DAILY 15 tablet 6  . Multiple Vitamin (MULITIVITAMIN WITH MINERALS) TABS Take 1 tablet by mouth daily.    . nitroGLYCERIN (NITROSTAT) 0.4 MG SL tablet Place 1 tablet (0.4 mg total) under the tongue every 5 (five) minutes as needed for chest pain. 25 tablet 3  . rosuvastatin (CRESTOR) 40 MG tablet Take 40 mg by mouth daily.    . sertraline (ZOLOFT) 50 MG tablet Take 25 mg by mouth daily.     No current facility-administered medications for this visit.    Allergies:   Ace inhibitors; Prilosec; Sulfa antibiotics; and Ultram    Social History:  The patient  reports that she has never smoked. She has never used smokeless tobacco. She reports that she does not drink alcohol or use illicit drugs.   Family History:  The patient's family history includes Diabetes type II in her father; Heart disease in her father and mother; Hypertension in her father and mother.    ROS:  Please see the history of present illness.   Otherwise, review of systems are positive for none.   All other systems are reviewed and negative.    PHYSICAL EXAM: VS:  BP 122/72 mmHg  Pulse 81  Ht 5\' 4"  (1.626 m)  Wt 179 lb 9.6 oz (81.466 kg)  BMI 30.81 kg/m2  LMP 02/06/2010 , BMI Body mass index is 30.81  kg/(m^2). GEN: Well nourished, well developed, in no acute distress HEENT: normal Neck: no JVD, carotid bruits, or masses Cardiac: RRR; no murmurs, rubs, or gallops,no edema  Respiratory:  clear to auscultation bilaterally, normal work of breathing GI: soft, nontender, nondistended, + BS MS: no deformity or atrophy Skin: warm and dry, no rash Neuro:  Strength and sensation are intact Psych: euthymic mood, full affect   EKG:  EKG is not ordered today.    Recent Labs: 07/04/2013: Pro B Natriuretic peptide (BNP) 25.6; TSH 1.240 01/09/2014: BUN 14; Creatinine 0.79; Hemoglobin 12.5; Platelets 151; Potassium 4.2; Sodium  145 03/30/2014: ALT 27    Lipid Panel    Component Value Date/Time   CHOL 154 03/30/2014 0757   TRIG 104.0 03/30/2014 0757   HDL 50.40 03/30/2014 0757   CHOLHDL 3 03/30/2014 0757   VLDL 20.8 03/30/2014 0757   LDLCALC 83 03/30/2014 0757      Wt Readings from Last 3 Encounters:  05/06/14 179 lb 9.6 oz (81.466 kg)  02/09/14 178 lb 8 oz (80.967 kg)  01/15/14 176 lb 6.4 oz (80.015 kg)      Other studies Reviewed: Additional studies/ records that were reviewed today include: .   ASSESSMENT AND PLAN:  CAD S/P mLAD Bifurcation PCI: Resolute DES 2.25 mm x 18 mm(2.75 mm), 2.0 mm Angiosculpt of Diag : Free of angina since the procedure  Hyperlipidemia: Last LDL was in the neighborhood of 80  Essential hypertension: Controlled      Current medicines are reviewed at length with the patient today.  The patient does not have concerns regarding medicines.  The following changes have been made:  no change. Plan to continue aspirin and Plavix for least a total of 12 months and probably longer.  Labs/ tests ordered today include:  No orders of the defined types were placed in this encounter.     Disposition:   FU with Linard Millers in 7 months   Signed, Sinclair Grooms, MD  05/06/2014 1:34 PM    [facility] [address],  [place], [place]  [address] Phone: [phone]; Fax: [phone]

## 2014-05-30 ENCOUNTER — Other Ambulatory Visit: Payer: Self-pay | Admitting: Interventional Cardiology

## 2014-10-06 ENCOUNTER — Encounter: Payer: Self-pay | Admitting: Interventional Cardiology

## 2014-10-14 ENCOUNTER — Other Ambulatory Visit (INDEPENDENT_AMBULATORY_CARE_PROVIDER_SITE_OTHER): Payer: 59 | Admitting: *Deleted

## 2014-10-14 DIAGNOSIS — E785 Hyperlipidemia, unspecified: Secondary | ICD-10-CM

## 2014-10-14 LAB — HEPATIC FUNCTION PANEL
ALK PHOS: 110 U/L (ref 39–117)
ALT: 33 U/L (ref 0–35)
AST: 30 U/L (ref 0–37)
Albumin: 4.5 g/dL (ref 3.5–5.2)
BILIRUBIN DIRECT: 0.1 mg/dL (ref 0.0–0.3)
BILIRUBIN TOTAL: 0.6 mg/dL (ref 0.2–1.2)
Total Protein: 7.1 g/dL (ref 6.0–8.3)

## 2014-10-14 LAB — LIPID PANEL
CHOLESTEROL: 143 mg/dL (ref 0–200)
HDL: 51 mg/dL (ref >39.00)
LDL Cholesterol: 62 mg/dL (ref 0–99)
NonHDL: 92.28
Total CHOL/HDL Ratio: 3
Triglycerides: 152 mg/dL — ABNORMAL HIGH (ref 0.0–149.0)
VLDL: 30.4 mg/dL (ref 0.0–40.0)

## 2014-10-15 ENCOUNTER — Telehealth: Payer: Self-pay

## 2014-10-15 DIAGNOSIS — E785 Hyperlipidemia, unspecified: Secondary | ICD-10-CM

## 2014-10-15 NOTE — Telephone Encounter (Signed)
Called to give pt lab results.lmtcb  

## 2014-10-15 NOTE — Telephone Encounter (Signed)
-----   Message from Belva Crome, MD sent at 10/14/2014  1:21 PM EDT ----- Liver and lipids are stable.

## 2014-10-16 NOTE — Telephone Encounter (Signed)
Pt aware of lab results. Liver and lipids are stable.. Pt verbalized understanding.

## 2014-10-16 NOTE — Telephone Encounter (Signed)
Follow up ° ° °Pt returning your call °

## 2014-10-20 ENCOUNTER — Encounter: Payer: Self-pay | Admitting: Obstetrics & Gynecology

## 2014-10-20 ENCOUNTER — Ambulatory Visit (INDEPENDENT_AMBULATORY_CARE_PROVIDER_SITE_OTHER): Payer: 59 | Admitting: Obstetrics & Gynecology

## 2014-10-20 VITALS — BP 116/68 | HR 60 | Resp 12 | Ht 63.75 in | Wt 179.0 lb

## 2014-10-20 DIAGNOSIS — Z01419 Encounter for gynecological examination (general) (routine) without abnormal findings: Secondary | ICD-10-CM

## 2014-10-20 NOTE — Progress Notes (Addendum)
64 y.o. G2P2 MarriedCaucasianF here for annual exam.  Had stent placed in December, 2015.  Has been followed closely by Dr. Linard Millers since last summer due to angina.  Not doing rehab as she is exercising regularly.  Doing well.  Denies vaginal bleeding.     PCP:  Dr. Rex Kras  Patient's last menstrual period was 02/06/2010.          Sexually active: Yes.    The current method of family planning is vasectomy.    Exercising: Yes.    walking, weights, and cardio Smoker:  no  Health Maintenance: Pap:  09/19/13 WNL, neg pap with neg HR HPV 3/13.   History of abnormal Pap:  no MMG:  Per patient-12/2013 at [facility] signed Colonoscopy:  2006-repeat in 10 years, saw Dr Cristina Gong, waiting to see if she can come off some of the blood thinners before having colonoscopy BMD:   10/12 TDaP:  2014 Screening Labs: PCP, Hb today: PCP, Urine today: PCP   reports that she has never smoked. She has never used smokeless tobacco. She reports that she does not drink alcohol or use illicit drugs.  Past Medical History  Diagnosis Date  . Hypothyroidism   . Essential hypertension     stress test 10 yrs ago.   pcp   dr Lennette Bihari little  . Hyperlipemia   . Osteopenia   . Complication of anesthesia     extreme sore throat and hoarseness after aneathesia, also "woke up screamimg" after surgery once"  . 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 Angina, class IV 01/2014  . CAD S/P mLAD Bifurcation PCI: Resolute DES 2.25 mm x 18 mm(2.75 mm), 2.0 mm Angiosculpt of Diag  01/08/2014    [place] 0,1,1 midLAD-Diag lesion - progressed from Moderate to Severe 90% LAD, 70-80% Diag from 6-12 2015 --> Class IV Angina --> DES PCI with Angiosculpt PCI of Diag  FFR of m-dRCA 50-70% = 0.9   . Hyperthyroidism     "had radioactive iodine tx in the 1980's"  . History of migraine headaches     "as  a child"  . Osteoarthritis     "joints ache"  . Anxiety     "menopausal related"    Past Surgical History  Procedure Laterality Date  . Laparoscopic cholecystectomy  4/97  . Wrist fracture surgery Right 1990's  . Dilation and curettage of uterus    . Tonsillectomy    . Posterior cervical fusion/foraminotomy  04/04/2011    Procedure: POSTERIOR CERVICAL FUSION/FORAMINOTOMY LEVEL 1;  Surgeon: Charlie Pitter, MD;  Location: [place] [facility];  Service: Neurosurgery;  Laterality: Left;  Left Cervical Six-Seven Laminectomy, Foraminotomy, Diskectomy   . Cardiac catheterization  06/2013  . Coronary angioplasty with stent placement  01/08/2014    "1"  . Fracture surgery    . Back surgery    . Breast cyst aspiration Left 04/1999    "done in dr's office"  . Left heart catheterization with coronary angiogram N/A 07/04/2013    Procedure: LEFT HEART CATHETERIZATION WITH CORONARY ANGIOGRAM;  Surgeon: Sinclair Grooms, MD;  Location: [facility] CATH LAB;  Service: Cardiovascular;  Laterality: N/A;  . Left heart catheterization with coronary angiogram N/A 01/08/2014    Procedure: LEFT HEART CATHETERIZATION WITH CORONARY ANGIOGRAM;  Surgeon: Sinclair Grooms, MD;  Location: [facility] CATH LAB;  Service: Cardiovascular;  Laterality: N/A;  .  Cardiac catheterization  01/08/2014    Procedure: INTRAVASCULAR PRESSURE WIRE/FFR STUDY;  Surgeon: Sinclair Grooms, MD;  Location: [facility] CATH LAB;  Service: Cardiovascular;;  distal RCA  . Percutaneous stent intervention  01/08/2014    Procedure: PERCUTANEOUS STENT INTERVENTION;  Surgeon: Sinclair Grooms, MD;  Location: [facility] CATH LAB;  Service: Cardiovascular;;  MID LAD  . Cardiac catheterization  01/08/2014    Procedure: CORONARY BALLOON ANGIOPLASTY;  Surgeon: Sinclair Grooms, MD;  Location: [facility] CATH LAB;  Service: Cardiovascular;;  diag    Family History  Problem Relation Age of Onset  . Heart disease Father   . Hypertension Father   . Diabetes type II Father   . Heart disease Mother    . Hypertension Mother     ROS:  Pertinent items are noted in HPI.  Otherwise, a comprehensive ROS was negative.  Exam:   BP 116/68 mmHg  Pulse 60  Resp 12  Ht 5' 3.75" (1.619 m)  Wt 179 lb (81.194 kg)  BMI 30.98 kg/m2  LMP 02/06/2010  Weight change: +6#  Height: 5' 3.75" (161.9 cm)  Ht Readings from Last 3 Encounters:  10/20/14 5' 3.75" (1.619 m)  05/06/14 5\' 4"  (1.626 m)  01/15/14 5' 3.75" (1.619 m)    General appearance: alert, cooperative and appears stated age Head: Normocephalic, without obvious abnormality, atraumatic Neck: no adenopathy, supple, symmetrical, trachea midline and thyroid normal to inspection and palpation Lungs: clear to auscultation bilaterally Breasts: normal appearance, no masses or tenderness Heart: regular rate and rhythm Abdomen: soft, non-tender; bowel sounds normal; no masses,  no organomegaly Extremities: extremities normal, atraumatic, no cyanosis or edema Skin: Skin color, texture, turgor normal. No rashes or lesions Lymph nodes: Cervical, supraclavicular, and axillary nodes normal. No abnormal inguinal nodes palpated Neurologic: Grossly normal   Pelvic: External genitalia:  no lesions              Urethra:  normal appearing urethra with no masses, tenderness or lesions              Bartholins and Skenes: normal                 Vagina: normal appearing vagina with normal color and discharge, no lesions              Cervix: no lesions              Pap taken: No. Bimanual Exam:  Uterus:  normal size, contour, position, consistency, mobility, non-tender              Adnexa: normal adnexa and no mass, fullness, tenderness               Rectovaginal: Confirms               Anus:  normal sphincter tone, no lesions  Chaperone was present for exam.  A:  Well Woman with normal exam, PMP not on HRT  Hypertension  Ns/p stent in LAD bifurcation 01/08/14 with Dr. Tamala Julian Hyperlipidemia Vaginal atrophic changes/OAB symptoms Hypothyroidism   Hemorrhoids  P: Mammogram yearly.  Pap with neg HR HPV 3/13. Pap 2015.  No pap today. Estrace vaginal cream use.  Will confirm with Dr. Tamala Julian that he is ok with this. No rx given today.  (message received back from Dr. Tamala Julian that this is ok to continue--10/22/14) Does see PCP yearly.   AEX 1 year or f/u prn new issues/problems

## 2014-10-22 ENCOUNTER — Telehealth: Payer: Self-pay

## 2014-10-22 ENCOUNTER — Other Ambulatory Visit: Payer: Self-pay | Admitting: Adult Health

## 2014-10-22 NOTE — Addendum Note (Signed)
Addended by: Megan Salon on: 10/22/2014 06:19 AM   Modules accepted: Miquel Dunn

## 2014-10-22 NOTE — Telephone Encounter (Signed)
-----   Message from Megan Salon, MD sent at 10/22/2014  6:17 AM EDT ----- Regarding: premarin cream Please let pt know that Dr. Tamala Julian is ok with the continued use of her Premarin vaginal cream.  Notation made on AEX note so I will remember I communicated with him.  Thanks.  Vinnie Level

## 2014-10-22 NOTE — Telephone Encounter (Signed)
Lmtcb//kn 

## 2014-10-27 NOTE — Telephone Encounter (Signed)
Patient is aware.//kn

## 2014-10-29 ENCOUNTER — Ambulatory Visit: Payer: Self-pay | Admitting: Obstetrics & Gynecology

## 2014-11-25 ENCOUNTER — Other Ambulatory Visit: Payer: Self-pay | Admitting: Adult Health

## 2014-12-24 ENCOUNTER — Other Ambulatory Visit: Payer: Self-pay | Admitting: Interventional Cardiology

## 2014-12-26 ENCOUNTER — Other Ambulatory Visit: Payer: Self-pay | Admitting: Interventional Cardiology

## 2014-12-30 ENCOUNTER — Other Ambulatory Visit: Payer: Self-pay | Admitting: Interventional Cardiology

## 2015-01-22 ENCOUNTER — Ambulatory Visit (INDEPENDENT_AMBULATORY_CARE_PROVIDER_SITE_OTHER): Payer: 59 | Admitting: Interventional Cardiology

## 2015-01-22 ENCOUNTER — Encounter: Payer: Self-pay | Admitting: Interventional Cardiology

## 2015-01-22 VITALS — BP 134/82 | HR 70 | Ht 64.0 in | Wt 181.8 lb

## 2015-01-22 DIAGNOSIS — E785 Hyperlipidemia, unspecified: Secondary | ICD-10-CM

## 2015-01-22 DIAGNOSIS — I25110 Atherosclerotic heart disease of native coronary artery with unstable angina pectoris: Secondary | ICD-10-CM

## 2015-01-22 DIAGNOSIS — I2510 Atherosclerotic heart disease of native coronary artery without angina pectoris: Secondary | ICD-10-CM | POA: Diagnosis not present

## 2015-01-22 DIAGNOSIS — I10 Essential (primary) hypertension: Secondary | ICD-10-CM

## 2015-01-22 DIAGNOSIS — R0600 Dyspnea, unspecified: Secondary | ICD-10-CM

## 2015-01-22 DIAGNOSIS — Z9861 Coronary angioplasty status: Secondary | ICD-10-CM

## 2015-01-22 NOTE — Patient Instructions (Signed)
Medication Instructions:  Your physician has recommended you make the following change in your medication:  1.  STOP the Plavix once you finish this supply   Labwork: None ordered  Testing/Procedures: None ordered  Follow-Up: Your physician wants you to follow-up in: [place].  You will receive a reminder letter in the mail two months in advance. If you don't receive a letter, please call our office to schedule the follow-up appointment.   Any Other Special Instructions Will Be Listed Below (If Applicable).  Your physician discussed the importance of regular exercise and recommended that you start walking, walking, walking.   If you need a refill on your cardiac medications before your next appointment, please call your pharmacy.

## 2015-01-22 NOTE — Progress Notes (Signed)
Cardiology Office Note   Date:  01/22/2015   ID:  Veronica [name], DOB [birth year], MRN [id number]  PCP:  Gennette Pac, MD  Cardiologist:  Sinclair Grooms, MD   Chief Complaint  Patient presents with  . Coronary Artery Disease      History of Present Illness: Veronica [name] is a 64 y.o. female who presents for  Mid to distal LAD drug-eluting stent placed one year ago. No significant episodes of angina since that time. Overall she is doing quite well. She worsens statin therapy is impairing her memory. Otherwise there no complaints. She has been under a lot of stress. When under stress, she occasionally has some tightness in her chest.    Past Medical History  Diagnosis Date  . Hypothyroidism   . Essential hypertension     stress test 10 yrs ago.   pcp   dr Lennette Bihari little  . Hyperlipemia   . Osteopenia   . Complication of anesthesia     extreme sore throat and hoarseness after aneathesia, also "woke up screamimg" after surgery once"  . 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 Angina, class IV ([place]) 01/2014  . CAD S/P mLAD Bifurcation PCI: Resolute DES 2.25 mm x 18 mm(2.75 mm), 2.0 mm Angiosculpt of Diag  01/08/2014    [place] 0,1,1 midLAD-Diag lesion - progressed from Moderate to Severe 90% LAD, 70-80% Diag from 6-12 2015 --> Class IV Angina --> DES PCI with Angiosculpt PCI of Diag  FFR of m-dRCA 50-70% = 0.9   . Hyperthyroidism     "had radioactive iodine tx in the 1980's"  . History of migraine headaches     "as a child"  . Osteoarthritis     "joints ache"  . Anxiety     "menopausal related"    Past Surgical History  Procedure Laterality Date  . Laparoscopic cholecystectomy  4/97  . Wrist fracture surgery Right 1990's  . Dilation and curettage of uterus    . Tonsillectomy    . Posterior cervical fusion/foraminotomy   04/04/2011    Procedure: POSTERIOR CERVICAL FUSION/FORAMINOTOMY LEVEL 1;  Surgeon: Charlie Pitter, MD;  Location: [place];  Service: Neurosurgery;  Laterality: Left;  Left Cervical Six-Seven Laminectomy, Foraminotomy, Diskectomy   . Cardiac catheterization  06/2013  . Coronary angioplasty with stent placement  01/08/2014    "1"  . Fracture surgery    . Back surgery    . Breast cyst aspiration Left 04/1999    "done in dr's office"  . Left heart catheterization with coronary angiogram N/A 07/04/2013    Procedure: LEFT HEART CATHETERIZATION WITH CORONARY ANGIOGRAM;  Surgeon: Sinclair Grooms, MD;  Location[facility] CATH LAB;  Service: Cardiovascular;  Laterality: N/A;  . Left heart catheterization with coronary angiogram N/A 01/08/2014    Procedure: LEFT HEART CATHETERIZATION WITH CORONARY ANGIOGRAM;  Surgeon: Sinclair Grooms, MD;  Location: [facility] CATH LAB;  Service: Cardiovascular;  Laterality: N/A;  . Cardiac catheterization  01/08/2014    Procedure: INTRAVASCULAR PRESSURE WIRE/FFR STUDY;  Surgeon: Sinclair Grooms, MD;  Location: [facility] CATH LAB;  Service: Cardiovascular;;  distal RCA  . Percutaneous stent intervention  01/08/2014    Procedure: PERCUTANEOUS STENT INTERVENTION;  Surgeon: Sinclair Grooms, MD;  Location: [facility] CATH LAB;  Service: Cardiovascular;;  MID LAD  . Cardiac catheterization  01/08/2014    Procedure: CORONARY BALLOON ANGIOPLASTY;  Surgeon: Sinclair Grooms, MD;  Location: [facility] CATH LAB;  Service: Cardiovascular;;  diag     Current Outpatient Prescriptions  Medication Sig Dispense Refill  . ALPRAZolam (XANAX) 0.5 MG tablet Take 0.5 mg by mouth at bedtime as needed. For sleep    . aspirin 81 MG chewable tablet Chew 1 tablet (81 mg total) by mouth daily.    [name] augmented betamethasone dipropionate (DIPROLENE-AF) 0.05 % cream Apply 1 application topically daily as needed (for skin irritation).     . clopidogrel (PLAVIX) 75 MG tablet TAKE 1 TABLET (75 MG TOTAL) BY MOUTH DAILY WITH  BREAKFAST. 30 tablet 3  . cyclobenzaprine (FLEXERIL) 10 MG tablet Take 10 mg by mouth 3 (three) times daily as needed for muscle spasms.   0  . estradiol (ESTRACE) 0.1 MG/GM vaginal cream 1 gram vaginally twice weekly 42.5 g 1  . famotidine (PEPCID) 20 MG tablet Take 20 mg by mouth 2 (two) times daily.    . fish oil-omega-3 fatty acids 1000 MG capsule Take 1 g by mouth daily.    . halobetasol (ULTRAVATE) 0.05 % cream Apply 1 application topically daily as needed (for skin irritation).     . hydrocortisone valerate cream (WESTCORT) 0.2 % Apply 1 application topically 2 (two) times daily as needed (rash).     [name] levothyroxine (SYNTHROID, LEVOTHROID) 100 MCG tablet Take 100 mcg by mouth daily before breakfast.    . metoprolol tartrate (LOPRESSOR) 25 MG tablet TAKE 1/2 TABLET BY MOUTH DAILY 15 tablet 1  . Multiple Vitamin (MULITIVITAMIN WITH MINERALS) TABS Take 1 tablet by mouth daily.    . nitroGLYCERIN (NITROSTAT) 0.4 MG SL tablet Place 1 tablet (0.4 mg total) under the tongue every 5 (five) minutes as needed for chest pain. 25 tablet 3  . rosuvastatin (CRESTOR) 40 MG tablet Take 40 mg by mouth daily.    . sertraline (ZOLOFT) 50 MG tablet Take 25 mg by mouth daily.    [name] ZETIA 10 MG tablet TAKE 1 TABLET BY MOUTH EVERY DAY 30 tablet 2   No current facility-administered medications for this visit.    Allergies:   Ace inhibitors; Prilosec; Sulfa antibiotics; and Ultram    Social History:  The patient  reports that she has never smoked. She has never used smokeless tobacco. She reports that she does not drink alcohol or use illicit drugs.   Family History:  The patient's family history includes Diabetes type II in her father; Heart disease in her father and mother; Hypertension in her father and mother.    ROS:  Please see the history of present illness.   Otherwise, review of systems are positive for  none.   All other systems are reviewed and negative.    PHYSICAL EXAM: VS:  BP 134/82 mmHg   Pulse 70  Ht 5\' 4"  (1.626 m)  Wt 181 lb 12.8 oz (82.464 kg)  BMI 31.19 kg/m2  LMP 02/06/2010 , BMI Body mass index is 31.19 kg/(m^2). GEN: Well nourished, well developed, in no acute distress HEENT: normal Neck: no JVD, carotid bruits, or masses Cardiac: RRR.  There is no murmur, rub, or gallop. There is no edema. Respiratory:  clear to auscultation bilaterally, normal work of breathing. GI: soft, nontender, nondistended, + BS MS: no deformity or atrophy Skin: warm and dry, no rash Neuro:  Strength and sensation are intact Psych: euthymic mood, full affect   EKG:  EKG  is ordered  today. The ekg reveals  Normal sinus rhythm and normal tracing.   Recent Labs: 10/14/2014: ALT 33    Lipid Panel    Component Value Date/Time   CHOL 143 10/14/2014 0736   TRIG 152.0* 10/14/2014 0736   HDL 51.00 10/14/2014 0736   CHOLHDL 3 10/14/2014 0736   VLDL 30.4 10/14/2014 0736   LDLCALC 62 10/14/2014 0736      Wt Readings from Last 3 Encounters:  01/22/15 181 lb 12.8 oz (82.464 kg)  10/20/14 179 lb (81.194 kg)  05/06/14 179 lb 9.6 oz (81.466 kg)      Other studies Reviewed: Additional studies/ records that were reviewed today include:  none. The findings include  none.    ASSESSMENT AND PLAN:  1. CAD S/P mLAD Bifurcation PCI: Resolute DES 2.25 mm x 18 mm(2.75 mm), 2.0 mm Angiosculpt of Diag   stable without significant angina  2. Essential hypertension  excellent control - EKG 12-Lead  3. Hyperlipidemia  on therapy. Followed by cardiology  4. Coronary artery disease involving native coronary artery of native heart with unstable angina pectoris ([place])  as above - EKG 12-Lead  5. Dyspnea  resolved    Current medicines are reviewed at length with the patient today.  The patient has the following concerns regarding medicines:  None other than concern about statins and memory.  The following changes/actions have been instituted:     discontinue Plavix  Labs/ tests  ordered today include:   Orders Placed This Encounter  Procedures  . EKG 12-Lead     Disposition:   FU with HS in 1 year  Signed, Sinclair Grooms, MD  01/22/2015 9:45 AM    [facility] [place], [place], [place]  [address] Phone: [phone]; Fax: [phone]

## 2015-02-17 ENCOUNTER — Telehealth: Payer: Self-pay | Admitting: *Deleted

## 2015-02-17 NOTE — Telephone Encounter (Signed)
Left voicemail for pt to call back re: BMD results   " Please notified pt of mild osteopenia in Left hip only. Nothing needs to be done at this time. Repeat 5 years." per Dr. Sabra Heck.

## 2015-02-19 NOTE — Telephone Encounter (Signed)
Patient notified of results. Verbalized understanding.  Patient requesting report mail to her. Will route to front desk  Encounter closed.

## 2015-03-01 ENCOUNTER — Other Ambulatory Visit: Payer: Self-pay | Admitting: Interventional Cardiology

## 2015-03-11 ENCOUNTER — Encounter: Payer: Self-pay | Admitting: Interventional Cardiology

## 2015-04-11 ENCOUNTER — Encounter: Payer: Self-pay | Admitting: Interventional Cardiology

## 2015-04-29 ENCOUNTER — Other Ambulatory Visit: Payer: Self-pay | Admitting: Interventional Cardiology

## 2015-06-04 ENCOUNTER — Other Ambulatory Visit: Payer: Self-pay

## 2015-06-04 MED ORDER — NITROGLYCERIN 0.4 MG SL SUBL: 0.4000 mg | SUBLINGUAL_TABLET | SUBLINGUAL | Status: DC | PRN

## 2015-07-07 IMAGING — CR DG CHEST 1V PORT
1 series · 1 of 1 positions shown · non-contrast
Comparison: Portable exam 6996 hr compared to 03/27/2011

CLINICAL DATA: LEFT side chest pressure and shortness of breath for
2 hr, history hypertension

EXAM:
PORTABLE CHEST - 1 VIEW

[AP]
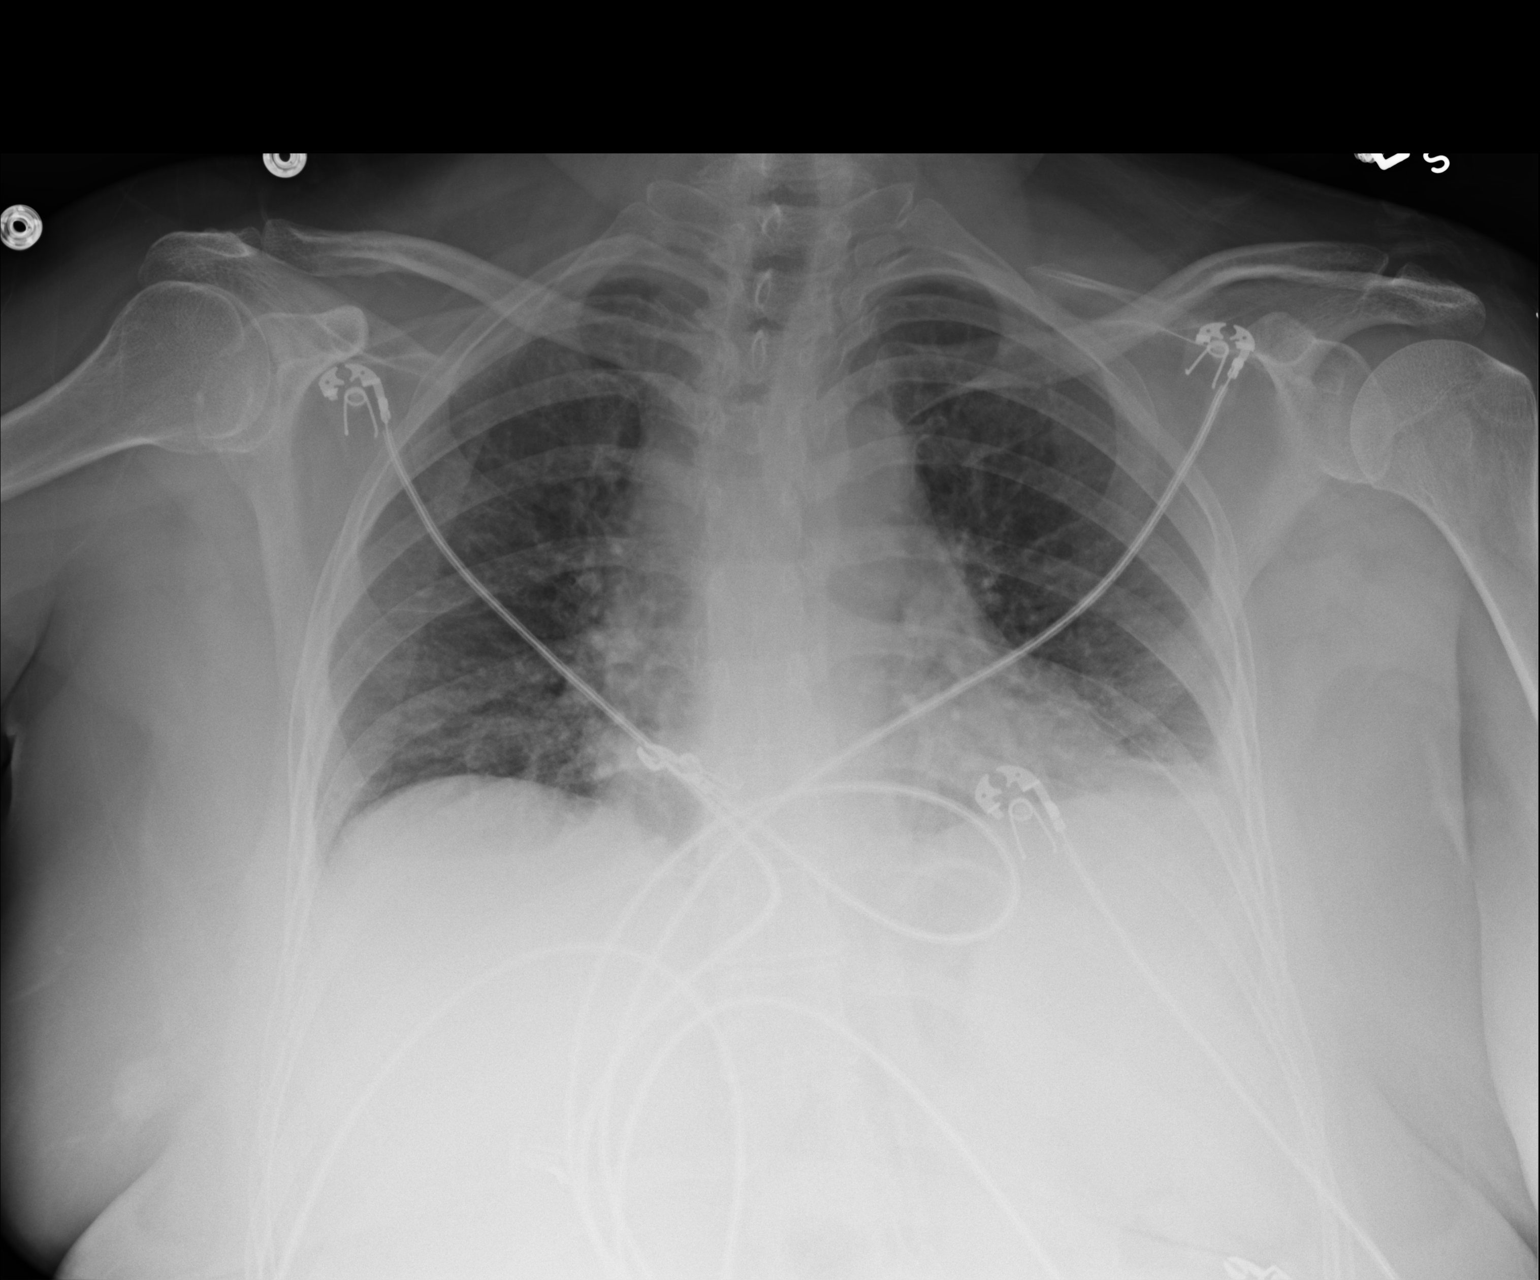

[1 of 1 positions shown; findings below may reference images not displayed]

FINDINGS: Upper normal heart size accentuated by hypoinflation.

Mediastinal contours and pulmonary vascularity normal.

Decreased lung volumes with bibasilar atelectasis.

No definite infiltrate, pleural effusion or pneumothorax.

Bones unremarkable.
IMPRESSION: Decreased lung volumes with bibasilar atelectasis.

## 2016-01-07 ENCOUNTER — Other Ambulatory Visit: Payer: Self-pay | Admitting: Interventional Cardiology

## 2016-01-25 ENCOUNTER — Encounter: Payer: Self-pay | Admitting: Interventional Cardiology

## 2016-01-25 ENCOUNTER — Ambulatory Visit (INDEPENDENT_AMBULATORY_CARE_PROVIDER_SITE_OTHER): Payer: 59 | Admitting: Interventional Cardiology

## 2016-01-25 VITALS — BP 134/80 | HR 89 | Ht 64.0 in | Wt 188.8 lb

## 2016-01-25 DIAGNOSIS — Z9861 Coronary angioplasty status: Secondary | ICD-10-CM

## 2016-01-25 DIAGNOSIS — I2510 Atherosclerotic heart disease of native coronary artery without angina pectoris: Secondary | ICD-10-CM | POA: Diagnosis not present

## 2016-01-25 DIAGNOSIS — E784 Other hyperlipidemia: Secondary | ICD-10-CM | POA: Diagnosis not present

## 2016-01-25 DIAGNOSIS — I10 Essential (primary) hypertension: Secondary | ICD-10-CM

## 2016-01-25 DIAGNOSIS — E7849 Other hyperlipidemia: Secondary | ICD-10-CM

## 2016-01-25 NOTE — Patient Instructions (Signed)
Medication Instructions:  None  Labwork: None  Testing/Procedures: None  Follow-Up: Your physician wants you to follow-up in: 1 year with Dr. Tamala Julian. You will receive a reminder letter in the mail two months in advance. If you don't receive a letter, please call our office to schedule the follow-up appointment.   Any Other Special Instructions Will Be Listed Below (If Applicable).  Stay as active as possible!   If you need a refill on your cardiac medications before your next appointment, please call your pharmacy.

## 2016-01-25 NOTE — Progress Notes (Signed)
Cardiology Office Note    Date:  01/25/2016   ID:  Veronica [name], DOB 22-Dec-1950, MRN [id number]  PCP:  Gennette Pac, MD  Cardiologist: Sinclair Grooms, MD   Chief Complaint  Patient presents with  . Coronary Artery Disease    History of Present Illness:  Veronica [name] is a 65 y.o. female who presents for Mid to distal LAD drug-eluting stent placed one year ago.     Past Medical History:  Diagnosis Date  . Angina, class IV ([place]) 01/2014  . Anxiety    "menopausal related"  . CAD S/P mLAD Bifurcation PCI: Resolute DES 2.25 mm x 18 mm(2.75 mm), 2.0 mm Angiosculpt of Diag  01/08/2014   [place] 0,1,1 midLAD-Diag lesion - progressed from Moderate to Severe 90% LAD, 70-80% Diag from 6-12 2015 --> Class IV Angina --> DES PCI with Angiosculpt PCI of Diag  FFR of m-dRCA 50-70% = 0.9   . Complication of anesthesia    extreme sore throat and hoarseness after aneathesia, also "woke up screamimg" after surgery once"  . Coronary artery disease involving native coronary artery with angina pectoris with documented spasm ([place]) 06/2013   h/o moderate 3 Vessel CAD with mLAD-Diag bifurcation ([place] 0,1,1)  . Essential hypertension    stress test 10 yrs ago.   pcp   dr Lennette Bihari little  . History of migraine headaches    "as a child"  . Hyperlipemia   . Hyperthyroidism    "had radioactive iodine tx in the 1980's"  . Hypothyroidism   . Osteoarthritis    "joints ache"  . Osteopenia     Past Surgical History:  Procedure Laterality Date  . BACK SURGERY    . BREAST CYST ASPIRATION Left 04/1999   "done in dr's office"  . CARDIAC CATHETERIZATION  06/2013  . CARDIAC CATHETERIZATION  01/08/2014   Procedure: INTRAVASCULAR PRESSURE WIRE/FFR STUDY;  Surgeon: Sinclair Grooms, MD;  Location: [facility] CATH LAB;  Service: Cardiovascular;;  distal RCA  . CARDIAC CATHETERIZATION  01/08/2014   Procedure: CORONARY BALLOON ANGIOPLASTY;  Surgeon: Sinclair Grooms, MD;  Location: [facility] CATH LAB;  Service:  Cardiovascular;;  diag  . CORONARY ANGIOPLASTY WITH STENT PLACEMENT  01/08/2014   "1"  . DILATION AND CURETTAGE OF UTERUS    . FRACTURE SURGERY    . LAPAROSCOPIC CHOLECYSTECTOMY  4/97  . LEFT HEART CATHETERIZATION WITH CORONARY ANGIOGRAM N/A 07/04/2013   Procedure: LEFT HEART CATHETERIZATION WITH CORONARY ANGIOGRAM;  Surgeon: Sinclair Grooms, MD;  Location: [facility] CATH LAB;  Service: Cardiovascular;  Laterality: N/A;  . LEFT HEART CATHETERIZATION WITH CORONARY ANGIOGRAM N/A 01/08/2014   Procedure: LEFT HEART CATHETERIZATION WITH CORONARY ANGIOGRAM;  Surgeon: Sinclair Grooms, MD;  Location: [facility] CATH LAB;  Service: Cardiovascular;  Laterality: N/A;  . PERCUTANEOUS STENT INTERVENTION  01/08/2014   Procedure: PERCUTANEOUS STENT INTERVENTION;  Surgeon: Sinclair Grooms, MD;  Location: [facility] CATH LAB;  Service: Cardiovascular;;  MID LAD  . POSTERIOR CERVICAL FUSION/FORAMINOTOMY  04/04/2011   Procedure: POSTERIOR CERVICAL FUSION/FORAMINOTOMY LEVEL 1;  Surgeon: Charlie Pitter, MD;  Location: [place] [facility];  Service: Neurosurgery;  Laterality: Left;  Left Cervical Six-Seven Laminectomy, Foraminotomy, Diskectomy   . TONSILLECTOMY    . WRIST FRACTURE SURGERY Right 1990's    Current Medications: Outpatient Medications Prior to Visit  Medication Sig Dispense Refill  . ALPRAZolam (XANAX) 0.5 MG tablet Take 0.5 mg by mouth at bedtime as needed. For sleep    .  aspirin 81 MG chewable tablet Chew 1 tablet (81 mg total) by mouth daily.    [name] augmented betamethasone dipropionate (DIPROLENE-AF) 0.05 % cream Apply 1 application topically daily as needed (for skin irritation).     . cyclobenzaprine (FLEXERIL) 10 MG tablet Take 10 mg by mouth 3 (three) times daily as needed for muscle spasms.   0  . estradiol (ESTRACE) 0.1 MG/GM vaginal cream 1 gram vaginally twice weekly 42.5 g 1  . fish oil-omega-3 fatty acids 1000 MG capsule Take 1 g by mouth daily.    . halobetasol (ULTRAVATE) 0.05 % cream Apply 1 application topically  daily as needed (for skin irritation).     . hydrocortisone valerate cream (WESTCORT) 0.2 % Apply 1 application topically 2 (two) times daily as needed (rash).     [name] levothyroxine (SYNTHROID, LEVOTHROID) 100 MCG tablet Take 100 mcg by mouth daily before breakfast.    . metoprolol tartrate (LOPRESSOR) 25 MG tablet TAKE 1/2 TABLET BY MOUTH DAILY 15 tablet 0  . Multiple Vitamin (MULITIVITAMIN WITH MINERALS) TABS Take 1 tablet by mouth daily.    . nitroGLYCERIN (NITROSTAT) 0.4 MG SL tablet Place 1 tablet (0.4 mg total) under the tongue every 5 (five) minutes as needed for chest pain. 25 tablet 3  . rosuvastatin (CRESTOR) 40 MG tablet Take 40 mg by mouth daily.    [name] ZETIA 10 MG tablet TAKE 1 TABLET BY MOUTH EVERY DAY 30 tablet 9  . clopidogrel (PLAVIX) 75 MG tablet TAKE 1 TABLET (75 MG TOTAL) BY MOUTH DAILY WITH BREAKFAST. 30 tablet 3  . famotidine (PEPCID) 20 MG tablet Take 20 mg by mouth 2 (two) times daily.    . sertraline (ZOLOFT) 50 MG tablet Take 25 mg by mouth daily.     No facility-administered medications prior to visit.      Allergies:   Ace inhibitors; Prilosec [omeprazole magnesium]; Sulfa antibiotics; and Ultram [tramadol hcl]   Social History   Social History  . Marital status: Married    Spouse name: N/A  . Number of children: N/A  . Years of education: N/A   Social History Main Topics  . Smoking status: Never Smoker  . Smokeless tobacco: Never Used  . Alcohol use No  . Drug use: No  . Sexual activity: Yes    Partners: Male    Birth control/ protection: Post-menopausal   Other Topics Concern  . None   Social History Narrative  . None     Family History:  The patient's family history includes Diabetes type II in her father; Heart disease in her father and mother; Hypertension in her father and mother.   ROS:   Please see the history of present illness.    None  All other systems reviewed and are negative.   PHYSICAL EXAM:   VS:  BP 134/80 (BP Location: Left  Arm)   Pulse 89   Ht 5\' 4"  (1.626 m)   Wt 188 lb 12.8 oz (85.6 kg)   LMP 02/06/2010   BMI 32.41 kg/m    GEN: Well nourished, well developed, in no acute distress  HEENT: normal  Neck: no JVD, carotid bruits, or masses Cardiac: RRR; no murmurs, rubs, or gallops,no edema  Respiratory:  clear to auscultation bilaterally, normal work of breathing GI: soft, nontender, nondistended, + BS MS: no deformity or atrophy  Skin: warm and dry, no rash Neuro:  Alert and Oriented x 3, Strength and sensation are intact Psych: euthymic mood, full affect  Wt  Readings from Last 3 Encounters:  01/25/16 188 lb 12.8 oz (85.6 kg)  01/22/15 181 lb 12.8 oz (82.5 kg)  10/20/14 179 lb (81.2 kg)      Studies/Labs Reviewed:   EKG:  EKG  Normal sinus rhythm, small inferior Q waves, nonspecific T wave flattening, and there is no change when compared to prior.  Recent Labs: No results found for requested labs within last 8760 hours.   Lipid Panel    Component Value Date/Time   CHOL 143 10/14/2014 0736   TRIG 152.0 (H) 10/14/2014 0736   HDL 51.00 10/14/2014 0736   CHOLHDL 3 10/14/2014 0736   VLDL 30.4 10/14/2014 0736   LDLCALC 62 10/14/2014 0736    Additional studies/ records that were reviewed today include:  No new or functional data.    ASSESSMENT:    1. CAD S/P mLAD Bifurcation PCI: Resolute DES 2.25 mm x 18 mm(2.75 mm), 2.0 mm Angiosculpt of Diag    2. Essential hypertension   3. Other hyperlipidemia      PLAN:  In order of problems listed above:  1. No angina of significance. She occasionally has mild discomfort early in exercise a goes away and she continues to exercise. No exercise intolerance. 2. Blood pressures and excellent control. 2 g sodium diet. Blood pressure target 140/90 or less. 3. Lipid LDL target is less than 70. Followed by Dr. Hulan Fess.    Medication Adjustments/Labs and Tests Ordered: Current medicines are reviewed at length with the patient today.   Concerns regarding medicines are outlined above.  Medication changes, Labs and Tests ordered today are listed in the Patient Instructions below. There are no Patient Instructions on file for this visit.   Signed, Sinclair Grooms, MD  01/25/2016 3:30 PM    [facility] [place], [place], [place]  [address] Phone: [phone]; Fax: [phone]

## 2016-02-06 ENCOUNTER — Other Ambulatory Visit: Payer: Self-pay | Admitting: Interventional Cardiology

## 2016-02-08 NOTE — Telephone Encounter (Signed)
Ok to approve one month until Dr. Tamala Julian can review labs.  Have requested most recent lipids from PCP's office. Thanks!

## 2016-02-08 NOTE — Telephone Encounter (Signed)
Patient does not have a recent lipid panel in epic and per last office visit, lipids are followed by pcp. Okay to refill or defer to pcp? Please advise. Thanks, MI

## 2016-02-09 ENCOUNTER — Other Ambulatory Visit: Payer: Self-pay | Admitting: Interventional Cardiology

## 2016-02-18 ENCOUNTER — Ambulatory Visit (INDEPENDENT_AMBULATORY_CARE_PROVIDER_SITE_OTHER): Payer: 59 | Admitting: Obstetrics & Gynecology

## 2016-02-18 ENCOUNTER — Encounter: Payer: Self-pay | Admitting: Obstetrics & Gynecology

## 2016-02-18 VITALS — BP 138/66 | HR 84 | Resp 16 | Ht 63.75 in | Wt 188.0 lb

## 2016-02-18 DIAGNOSIS — Z01419 Encounter for gynecological examination (general) (routine) without abnormal findings: Secondary | ICD-10-CM | POA: Diagnosis not present

## 2016-02-18 DIAGNOSIS — R1031 Right lower quadrant pain: Secondary | ICD-10-CM

## 2016-02-18 DIAGNOSIS — Z124 Encounter for screening for malignant neoplasm of cervix: Secondary | ICD-10-CM

## 2016-02-18 DIAGNOSIS — R109 Unspecified abdominal pain: Secondary | ICD-10-CM

## 2016-02-18 LAB — POCT URINALYSIS DIPSTICK
Bilirubin, UA: NEGATIVE
Blood, UA: NEGATIVE
Glucose, UA: NEGATIVE
Ketones, UA: NEGATIVE
Leukocytes, UA: NEGATIVE
Nitrite, UA: NEGATIVE
PH UA: 5
UROBILINOGEN UA: NEGATIVE

## 2016-02-18 MED ORDER — ESTRADIOL 0.1 MG/GM VA CREA: TOPICAL_CREAM | VAGINAL | 1 refills | Status: DC

## 2016-02-18 NOTE — Progress Notes (Signed)
66 y.o. G2P2 Married Caucasian F here for annual exam.  Patient complains of having some lower abdominal discomfort for about 2 weeks.  Pt felt this first after her dog jumped on her abdomen.  Denies urinary symptoms but wants her urine tested.  Pt feels like this was related to constipation.    Denies vaginal bleeding.    PCP:  Hulan Fess, MD.  Has appt scheduled for February.    Patient's last menstrual period was 02/06/2010.          Sexually active: Yes.    The current method of family planning is post menopausal status.    Exercising: Yes.    walking Smoker:  no  Health Maintenance: Pap:  09/19/13 negative  History of abnormal Pap:  no MMG:  02/05/15, [place] 02/16/15 BIRADS 2 benign  Colonoscopy:  2017 per patient normal BMD:   02/05/15 osteopenia  TDaP:  2014  Pneumonia vaccine(s):  never Zostavax:   PCP Hep C testing: testing done with PCP Screening Labs: PCP, Hb today: PCP, Urine today: trace of protein   reports that she has never smoked. She has never used smokeless tobacco. She reports that she does not drink alcohol or use drugs.  Past Medical History:  Diagnosis Date  . Angina, class IV ([place]) 01/2014  . Anxiety    "menopausal related"  . CAD S/P mLAD Bifurcation PCI: Resolute DES 2.25 mm x 18 mm(2.75 mm), 2.0 mm Angiosculpt of Diag  01/08/2014   [place] 0,1,1 midLAD-Diag lesion - progressed from Moderate to Severe 90% LAD, 70-80% Diag from 6-12 2015 --> Class IV Angina --> DES PCI with Angiosculpt PCI of Diag  FFR of m-dRCA 50-70% = 0.9   . Complication of anesthesia    extreme sore throat and hoarseness after aneathesia, also "woke up screamimg" after surgery once"  . Coronary artery disease involving native coronary artery with angina pectoris with documented spasm ([place]) 06/2013   h/o moderate 3 Vessel CAD with mLAD-Diag bifurcation ([place] 0,1,1)  . Essential hypertension    stress test 10 yrs ago.   pcp   dr Lennette Bihari little  . History of migraine headaches    "as a  child"  . Hyperlipemia   . Hyperthyroidism    "had radioactive iodine tx in the 1980's"  . Hypothyroidism   . Osteoarthritis    "joints ache"  . Osteopenia     Past Surgical History:  Procedure Laterality Date  . BACK SURGERY    . BREAST CYST ASPIRATION Left 04/1999   "done in dr's office"  . CARDIAC CATHETERIZATION  06/2013  . CARDIAC CATHETERIZATION  01/08/2014   Procedure: INTRAVASCULAR PRESSURE WIRE/FFR STUDY;  Surgeon: Sinclair Grooms, MD;  Location[facility] C[facility] LAB;  Service: Cardiovascular;;  distal RCA  . CARDIAC CATHETERIZATION  01/08/2014   Procedure: CORONARY BALLOON ANGIOPLASTY;  Surgeon: Sinclair Grooms, MD;  Location: [facility] CATH LAB;  Service: Cardiovascular;;  diag  . CORONARY ANGIOPLASTY WITH STENT PLACEMENT  01/08/2014   "1"  . DILATION AND CURETTAGE OF UTERUS    . FRACTURE SURGERY    . LAPAROSCOPIC CHOLECYSTECTOMY  4/97  . LEFT HEART CATHETERIZATION WITH CORONARY ANGIOGRAM N/A 07/04/2013   Procedure: LEFT HEART CATHETERIZATION WITH CORONARY ANGIOGRAM;  Surgeon: Sinclair Grooms, MD;  Location: [facility] CATH LAB;  Service: Cardiovascular;  Laterality: N/A;  . LEFT HEART CATHETERIZATION WITH CORONARY ANGIOGRAM N/A 01/08/2014   Procedure: LEFT HEART CATHETERIZATION WITH CORONARY ANGIOGRAM;  Surgeon: Sinclair Grooms, MD;  Location: [place] CATH LAB;  Service: Cardiovascular;  Laterality: N/A;  . PERCUTANEOUS STENT INTERVENTION  01/08/2014   Procedure: PERCUTANEOUS STENT INTERVENTION;  Surgeon: Sinclair Grooms, MD;  Location: [facility] CATH LAB;  Service: Cardiovascular;;  MID LAD  . POSTERIOR CERVICAL FUSION/FORAMINOTOMY  04/04/2011   Procedure: POSTERIOR CERVICAL FUSION/FORAMINOTOMY LEVEL 1;  Surgeon: Charlie Pitter, MD;  Location: [place] NEURO ORS;  Service: Neurosurgery;  Laterality: Left;  Left Cervical Six-Seven Laminectomy, Foraminotomy, Diskectomy   . TONSILLECTOMY    . WRIST FRACTURE SURGERY Right 1990's    Current Outpatient Prescriptions  Medication Sig Dispense Refill  . ALPRAZolam  (XANAX) 0.5 MG tablet Take 0.5 mg by mouth at bedtime as needed. For sleep    . aspirin 81 MG chewable tablet Chew 1 tablet (81 mg total) by mouth daily.    [name] augmented betamethasone dipropionate (DIPROLENE-AF) 0.05 % cream Apply 1 application topically daily as needed (for skin irritation).     . cyclobenzaprine (FLEXERIL) 10 MG tablet Take 10 mg by mouth 3 (three) times daily as needed for muscle spasms.   0  . estradiol (ESTRACE) 0.1 MG/GM vaginal cream 1 gram vaginally twice weekly 42.5 g 1  . ezetimibe (ZETIA) 10 MG tablet TAKE 1 TABLET BY MOUTH EVERY DAY 30 tablet 0  . famotidine (PEPCID) 20 MG tablet Take 20 mg by mouth 2 (two) times daily as needed for heartburn or indigestion.    . fish oil-omega-3 fatty acids 1000 MG capsule Take 1 g by mouth daily.    . halobetasol (ULTRAVATE) 0.05 % cream Apply 1 application topically daily as needed (for skin irritation).     . hydrocortisone valerate cream (WESTCORT) 0.2 % Apply 1 application topically 2 (two) times daily as needed (rash).     [name] levothyroxine (SYNTHROID, LEVOTHROID) 100 MCG tablet Take 100 mcg by mouth daily before breakfast.    . metoprolol tartrate (LOPRESSOR) 25 MG tablet TAKE 1/2 TABLET BY MOUTH DAILY 15 tablet 0  . Multiple Vitamin (MULITIVITAMIN WITH MINERALS) TABS Take 1 tablet by mouth daily.    . nitroGLYCERIN (NITROSTAT) 0.4 MG SL tablet Place 1 tablet (0.4 mg total) under the tongue every 5 (five) minutes as needed for chest pain. 25 tablet 3  . rosuvastatin (CRESTOR) 40 MG tablet Take 40 mg by mouth daily.     No current facility-administered medications for this visit.     Family History  Problem Relation Age of Onset  . Heart disease Father   . Hypertension Father   . Diabetes type II Father   . Heart disease Mother   . Hypertension Mother     ROS:  Pertinent items are noted in HPI.  Otherwise, a comprehensive ROS was negative.  Exam:   Vitals:   02/18/16 0847  BP: 138/66  Pulse: 84  Resp: 16     General appearance: alert, cooperative and appears stated age Head: Normocephalic, without obvious abnormality, atraumatic Neck: no adenopathy, supple, symmetrical, trachea midline and thyroid normal to inspection and palpation Breasts: normal appearance, no masses or tenderness Abdomen: soft, non-tender; bowel sounds normal; no masses,  no organomegaly Extremities: extremities normal, atraumatic, no cyanosis or edema Skin: Skin color, texture, turgor normal. No rashes or lesions Lymph nodes: Cervical, supraclavicular, and axillary nodes normal. No abnormal inguinal nodes palpated Neurologic: Grossly normal   Pelvic: External genitalia:  no lesions              Urethra:  normal appearing urethra with no masses, tenderness  or lesions              Bartholins and Skenes: normal                 Vagina: normal appearing vagina with normal color and discharge, no lesions              Cervix: no lesions              Pap taken: Yes.   Bimanual Exam:  Uterus:  normal size, contour, position, consistency, mobility, non-tender              Adnexa: normal adnexa and no mass, fullness, tenderness               Rectovaginal: Confirms               Anus:  normal sphincter tone, no lesions  Chaperone was present for exam.  A:  Well Woman with normal exam PMP not on HRT  Hypertension S/p stent in LAD bifurcation 01/08/14 with Dr. Tamala Julian Hyperlipidemia Vaginal atrophic changes/OAB symptoms Hypothyroidism  Hemorrhoids RLQ pain x 2 weeks  P: Mammogram yearly.  Pap with neg HR HPV 3/13. Pap 2015.  No pap today. Estrace vaginal cream use.  Will confirm with Dr. Tamala Julian that he is ok with this. No rx given today.  (message received back from Dr. Tamala Julian that this is ok to continue--10/22/14) Does see PCP yearly.   Return for PUS AEX 1 year or f/u prn new issues/problem

## 2016-02-23 LAB — IPS PAP TEST WITH HPV

## 2016-02-24 ENCOUNTER — Other Ambulatory Visit: Payer: 59 | Admitting: Obstetrics & Gynecology

## 2016-02-24 ENCOUNTER — Other Ambulatory Visit: Payer: 59

## 2016-03-02 ENCOUNTER — Other Ambulatory Visit: Payer: 59 | Admitting: Obstetrics & Gynecology

## 2016-03-02 ENCOUNTER — Other Ambulatory Visit: Payer: 59

## 2016-03-07 ENCOUNTER — Other Ambulatory Visit: Payer: Self-pay | Admitting: Interventional Cardiology

## 2016-03-09 ENCOUNTER — Ambulatory Visit (INDEPENDENT_AMBULATORY_CARE_PROVIDER_SITE_OTHER): Payer: 59 | Admitting: Obstetrics & Gynecology

## 2016-03-09 ENCOUNTER — Other Ambulatory Visit: Payer: Self-pay | Admitting: Obstetrics & Gynecology

## 2016-03-09 ENCOUNTER — Ambulatory Visit (INDEPENDENT_AMBULATORY_CARE_PROVIDER_SITE_OTHER): Payer: 59

## 2016-03-09 ENCOUNTER — Encounter: Payer: Self-pay | Admitting: Obstetrics & Gynecology

## 2016-03-09 VITALS — BP 140/80 | HR 74 | Resp 14 | Ht 63.0 in | Wt 182.0 lb

## 2016-03-09 DIAGNOSIS — R109 Unspecified abdominal pain: Secondary | ICD-10-CM

## 2016-03-09 DIAGNOSIS — R1031 Right lower quadrant pain: Secondary | ICD-10-CM

## 2016-03-09 DIAGNOSIS — N9489 Other specified conditions associated with female genital organs and menstrual cycle: Secondary | ICD-10-CM | POA: Diagnosis not present

## 2016-03-09 NOTE — Progress Notes (Signed)
GYNECOLOGY  VISIT   HPI: 66 y.o. G2P2 Married Caucasian female with pelvic discomfort that started in early January.  We discussed this at her AEX.  I recommended she return for pelvic ultrasound.  She is here for this today.  GYNECOLOGIC HISTORY: Patient's last menstrual period was 02/06/2010. Contraception: PMP Menopausal hormone therapy: none  Patient Active Problem List   Diagnosis Date Noted  . Traumatic hematoma of right upper arm 01/15/2014  . CAD S/P mLAD Bifurcation PCI: Resolute DES 2.25 mm x 18 mm(2.75 mm), 2.0 mm Angiosculpt of Diag  01/08/2014    Class: Diagnosis of  . Essential hypertension 07/04/2013  . Hyperlipidemia 07/04/2013  . Hypothyroidism 07/04/2013  . Cervical radiculopathy 04/04/2011    Past Medical History:  Diagnosis Date  . Angina, class IV ([place]) 01/2014  . Anxiety    "menopausal related"  . CAD S/P mLAD Bifurcation PCI: Resolute DES 2.25 mm x 18 mm(2.75 mm), 2.0 mm Angiosculpt of Diag  01/08/2014   [place] 0,1,1 midLAD-Diag lesion - progressed from Moderate to Severe 90% LAD, 70-80% Diag from 6-12 2015 --> Class IV Angina --> DES PCI with Angiosculpt PCI of Diag  FFR of m-dRCA 50-70% = 0.9   . Complication of anesthesia    extreme sore throat and hoarseness after aneathesia, also "woke up screamimg" after surgery once"  . Coronary artery disease involving native coronary artery with angina pectoris with documented spasm ([place]) 06/2013   h/o moderate 3 Vessel CAD with mLAD-Diag bifurcation ([place] 0,1,1)  . Essential hypertension    stress test 10 yrs ago.   pcp   dr Lennette Bihari little  . History of migraine headaches    "as a child"  . Hyperlipemia   . Hyperthyroidism    "had radioactive iodine tx in the 1980's"  . Hypothyroidism   . Osteoarthritis    "joints ache"  . Osteopenia     Past Surgical History:  Procedure Laterality Date  . BACK SURGERY    . BREAST CYST ASPIRATION Left 04/1999   "done in dr's office"  . CARDIAC CATHETERIZATION  06/2013  .  CARDIAC CATHETERIZATION  01/08/2014   Procedure: INTRAVASCULAR PRESSURE WIRE/FFR STUDY;  Surgeon: Sinclair Grooms, MD;  Location: [facility] CATH LAB;  Service: Cardiovascular;;  distal RCA  . CARDIAC CATHETERIZATION  01/08/2014   Procedure: CORONARY BALLOON ANGIOPLASTY;  Surgeon: Sinclair Grooms, MD;  Location: [facility] CATH LAB;  Service: Cardiovascular;;  diag  . CORONARY ANGIOPLASTY WITH STENT PLACEMENT  01/08/2014   "1"  . DILATION AND CURETTAGE OF UTERUS    . FRACTURE SURGERY    . LAPAROSCOPIC CHOLECYSTECTOMY  4/97  . LEFT HEART CATHETERIZATION WITH CORONARY ANGIOGRAM N/A 07/04/2013   Procedure: LEFT HEART CATHETERIZATION WITH CORONARY ANGIOGRAM;  Surgeon: Sinclair Grooms, MD;  Location: [facility] CATH LAB;  Service: Cardiovascular;  Laterality: N/A;  . LEFT HEART CATHETERIZATION WITH CORONARY ANGIOGRAM N/A 01/08/2014   Procedure: LEFT HEART CATHETERIZATION WITH CORONARY ANGIOGRAM;  Surgeon: Sinclair Grooms, MD;  Location: [facility] CATH LAB;  Service: Cardiovascular;  Laterality: N/A;  . PERCUTANEOUS STENT INTERVENTION  01/08/2014   Procedure: PERCUTANEOUS STENT INTERVENTION;  Surgeon: Sinclair Grooms, MD;  Location: [facility] CATH LAB;  Service: Cardiovascular;;  MID LAD  . POSTERIOR CERVICAL FUSION/FORAMINOTOMY  04/04/2011   Procedure: POSTERIOR CERVICAL FUSION/FORAMINOTOMY LEVEL 1;  Surgeon: Charlie Pitter, MD;  Location: [place] NEURO ORS;  Service: Neurosurgery;  Laterality: Left;  Left Cervical Six-Seven Laminectomy, Foraminotomy, Diskectomy   . TONSILLECTOMY    .  WRIST FRACTURE SURGERY Right 1990's    MEDS:  Reviewed in EPIC and UTD  ALLERGIES: Ace inhibitors; Prilosec [omeprazole magnesium]; Sulfa antibiotics; and Ultram [tramadol hcl]  Family History  Problem Relation Age of Onset  . Heart disease Father   . Hypertension Father   . Diabetes type II Father   . Heart disease Mother   . Hypertension Mother     SH:  Married, non smoker  Review of Systems  All other systems reviewed and are  negative.  FINDINGS: Uterus: 6.3 x 4.1 x 2.5cm with two small fibroids noted Endometrium: 21mm with echogenic mass present Adnexa:  Left: 1.1 x 0.9 x 0.8cm     Right: 1.6 x 1.1 x 0.6cm Cul de sac: no free fluid  SHSG:  After obtaining appropriate verbal consent from patient, the cervix was visualized using a speculum, and prepped with betadine.  A tenaculum  was not applied to the cervix.  Dilation of the cervix was not necessary. The catheter was passed into the uterus and sterile saline introduced, with the following findings: at least one and likely two filling defects noted measuring 1.0cm.  No biopsy was performed.   Findings reviewed.  Likely polyp.  Due to size and likely second polyp present, hysteroscopic resection recommended.  Procedure discussed with patient.  Recovery and pain management discussed.  Risks discussed including but not limited to bleeding, rare risk of transfusion, infection, 1% risk of uterine perforation with risks of fluid deficit causing cardiac arrythmia, cerebral swelling and/or need to stop procedure early.  Fluid emboli and rare risk of death discussed.  DVT/PE, rare risk of risk of bowel/bladder/ureteral/vascular injury.  Patient aware if pathology abnormal she may need additional treatment.  All questions answered.    PHYSICAL EXAMINATION:    BP 140/80 (BP Location: Left Arm, Patient Position: Sitting, Cuff Size: Normal)   Pulse 74   Resp 14   Ht 5\' 3"  (1.6 m)   Wt 182 lb (82.6 kg)   LMP 02/06/2010   BMI 32.24 kg/m     General appearance: alert, cooperative and appears stated age CV:  Regular rate and rhythm Lungs:  clear to auscultation, no wheezes, rales or rhonchi, symmetric air entry  Pelvic: External genitalia:  no lesions              Urethra:  normal appearing urethra with no masses, tenderness or lesions              Bartholins and Skenes: normal                 Vagina: normal appearing vagina with normal color and discharge, no lesions               Cervix: no lesions               Chaperone was present for exam.  Assessment: Pelvic discomfort Endometrial mass  Plan: Hysteroscopic resection of mass with D&C.  Pt will be called about scheduling.  She is going on a cruise next week so would like to be called after that time.   ~30 minutes spent with patient >50% of time was in face to face discussion of above.

## 2016-03-13 ENCOUNTER — Telehealth: Payer: Self-pay | Admitting: Interventional Cardiology

## 2016-03-13 ENCOUNTER — Telehealth: Payer: Self-pay | Admitting: Obstetrics & Gynecology

## 2016-03-13 NOTE — Telephone Encounter (Signed)
Patient wants to go ahead and schedule her surgery.

## 2016-03-13 NOTE — Telephone Encounter (Signed)
Call to Dr Thompson Caul office, left message with Ashlyn. She will send message to Dr Tamala Julian assistant regarding ASA.

## 2016-03-13 NOTE — Telephone Encounter (Signed)
Call to patient. Advised surgery is scheduled for Friday 03-24-16 at [facility] at 0730, arrive 0600. Surgery instruction sheet reviewed with patient and printed copy will be mailed.  Due to patient's travel, call to Stella to facilitate pre-op appointment at hospital.  Patient reports had EKG 01/2016. Advised this is available in EPIC and will be up to anesthesia if this needs to be repeated. Patient voiced understanding.  Patient concerned about stopping ASA seven days before surgery and requests we confirm this with cardiology, Dr Tamala Julian. Advised will check on this and call her back.

## 2016-03-13 NOTE — Telephone Encounter (Signed)
Return call to patient. Desires to proceed with scheduling surgery. She will be traveling 03-14-16 thru 03-19-16 and would like to schedule after this trip. Discussed date of 2-16 and 03-27-16. Will proceed with scheduling and call her back once confirmed.

## 2016-03-13 NOTE — Telephone Encounter (Signed)
MESSAGE FORWARDED TO  DR  [place]

## 2016-03-13 NOTE — Telephone Encounter (Signed)
New message    Gay Filler from [facility] is calling about this pt. This pt is scheduled for gyn out patient procedure 2/16th. She needs to  D/c baby aspirin. She states they normally d/c the meds 7 days before procedure. Is this ok and for how long? Is there anything else that needs to be done before surgery?

## 2016-03-14 NOTE — Telephone Encounter (Signed)
Spoke with Gay Filler and made her aware of Dr. Thompson Caul recommendations.  She will pull from EPIC.

## 2016-03-14 NOTE — Telephone Encounter (Signed)
It is okayed upon his aspirin therapy.

## 2016-03-14 NOTE — Telephone Encounter (Signed)
Call back from [place] at Dr [facility] office. Per Dr Tamala Julian, patient may be off ASA as well as Plavix 1 week before surgery and resume when we feel comfortable. Note should be viewable in EPIC or they will fax. Advised if in EPIC, no fax needed.

## 2016-03-14 NOTE — Telephone Encounter (Signed)
She is on both aspirin and Plavix. With a light for both medications to be held? If so, this is approved. Can be off the medication up to 7 days prior to surgery and then resume when safe.

## 2016-03-14 NOTE — Telephone Encounter (Signed)
Call to patient to update on Dr Tamala Julian regarding ASA. Left message to call back.

## 2016-03-15 ENCOUNTER — Encounter: Payer: Self-pay | Admitting: Obstetrics & Gynecology

## 2016-03-16 NOTE — Telephone Encounter (Signed)
Call to patient regarding instructions about ASA and Plavix from Dr Tamala Julian. Left message to call back.

## 2016-03-17 NOTE — Telephone Encounter (Signed)
Call to patient, per ROI can leave message on voice mail. Left message advising patient Dr Thompson Caul office confirmed instructions to discontinue ASA and Plavix one week prior to surgery which is actually today. Left message to call back if any further questions.   Routing to Dr Sabra Heck for review.

## 2016-03-19 ENCOUNTER — Other Ambulatory Visit: Payer: Self-pay | Admitting: Obstetrics & Gynecology

## 2016-03-20 ENCOUNTER — Telehealth: Payer: Self-pay | Admitting: Obstetrics & Gynecology

## 2016-03-20 NOTE — Telephone Encounter (Signed)
Patient called and left a message over the weekend. She said she has been out of town and will be out of town again on Monday, 03/20/16.  She wanted to tell Gay Filler she is not taking Plavix and discontinued that about a year ago.  She also said she stopped taking aspirin last Thursday, 03/16/16.  She will call the office to follow up on Tuesday, 03/21/16, or on Monday if she gets a chance.

## 2016-03-20 NOTE — Patient Instructions (Signed)
Your procedure is scheduled on:  Friday, Feb. 16, 2018  Enter through the [employer] [facility] at:  6:00 AM  Pick up the phone at the desk and dial [phone].  Call this number if you have problems the morning of surgery: [phone].  Remember: Do NOT eat food or drink after:  Midnight Thursday  Take these medicines the morning of surgery with a SIP OF WATER:  Famotidine, Levothyroxine, Metoprolol, Rosuvastatin  Stop ALL herbal medications and fish oil at this time  Do NOT smoke the day of surgery.  Do NOT wear jewelry (body piercing), metal hair clips/bobby pins, make-up, or nail polish. Do NOT wear lotions, powders, or perfumes.  You may wear deodorant. Do NOT shave for 48 hours prior to surgery. Do NOT bring valuables to the hospital. Contacts, dentures, or bridgework may not be worn into surgery.  Have a responsible adult drive you home and stay with you for 24 hours after your procedure  Bring a copy of your healthcare power of attorney and living will documents.  **Effective Friday, Jan. 12, 2018, [place] will implement no hospital visitations from children age 32 and younger due to a steady increase in flu activity in our community and hospitals. **

## 2016-03-21 ENCOUNTER — Encounter (HOSPITAL_COMMUNITY): Payer: Self-pay

## 2016-03-21 ENCOUNTER — Encounter (HOSPITAL_COMMUNITY)
Admission: RE | Admit: 2016-03-21 | Discharge: 2016-03-21 | Disposition: A | Discharge status: Home / Self Care | Payer: 59 | Source: Ambulatory Visit | Attending: Obstetrics & Gynecology | Admitting: Obstetrics & Gynecology

## 2016-03-21 DIAGNOSIS — Z01812 Encounter for preprocedural laboratory examination: Secondary | ICD-10-CM | POA: Insufficient documentation

## 2016-03-21 DIAGNOSIS — K219 Gastro-esophageal reflux disease without esophagitis: Secondary | ICD-10-CM | POA: Diagnosis not present

## 2016-03-21 DIAGNOSIS — E0590 Thyrotoxicosis, unspecified without thyrotoxic crisis or storm: Secondary | ICD-10-CM | POA: Diagnosis not present

## 2016-03-21 DIAGNOSIS — Z955 Presence of coronary angioplasty implant and graft: Secondary | ICD-10-CM | POA: Diagnosis not present

## 2016-03-21 DIAGNOSIS — Z7982 Long term (current) use of aspirin: Secondary | ICD-10-CM | POA: Diagnosis not present

## 2016-03-21 DIAGNOSIS — E039 Hypothyroidism, unspecified: Secondary | ICD-10-CM | POA: Diagnosis not present

## 2016-03-21 DIAGNOSIS — E785 Hyperlipidemia, unspecified: Secondary | ICD-10-CM | POA: Diagnosis not present

## 2016-03-21 DIAGNOSIS — Z79899 Other long term (current) drug therapy: Secondary | ICD-10-CM | POA: Diagnosis not present

## 2016-03-21 DIAGNOSIS — N840 Polyp of corpus uteri: Secondary | ICD-10-CM | POA: Diagnosis present

## 2016-03-21 DIAGNOSIS — M8580 Other specified disorders of bone density and structure, unspecified site: Secondary | ICD-10-CM | POA: Diagnosis not present

## 2016-03-21 DIAGNOSIS — F419 Anxiety disorder, unspecified: Secondary | ICD-10-CM | POA: Diagnosis not present

## 2016-03-21 DIAGNOSIS — I10 Essential (primary) hypertension: Secondary | ICD-10-CM | POA: Diagnosis not present

## 2016-03-21 DIAGNOSIS — M1990 Unspecified osteoarthritis, unspecified site: Secondary | ICD-10-CM | POA: Diagnosis not present

## 2016-03-21 DIAGNOSIS — I25111 Atherosclerotic heart disease of native coronary artery with angina pectoris with documented spasm: Secondary | ICD-10-CM | POA: Diagnosis not present

## 2016-03-21 HISTORY — DX: Headache, unspecified: R51.9

## 2016-03-21 HISTORY — DX: Gastro-esophageal reflux disease without esophagitis: K21.9

## 2016-03-21 HISTORY — DX: Headache: R51

## 2016-03-21 LAB — CBC
HEMATOCRIT: 42.5 % (ref 36.0–46.0)
HEMOGLOBIN: 14.9 g/dL (ref 12.0–15.0)
MCH: 30.4 pg (ref 26.0–34.0)
MCHC: 35.1 g/dL (ref 30.0–36.0)
MCV: 86.7 fL (ref 78.0–100.0)
Platelets: 194 10*3/uL (ref 150–400)
RBC: 4.9 MIL/uL (ref 3.87–5.11)
RDW: 13.4 % (ref 11.5–15.5)
WBC: 5.6 10*3/uL (ref 4.0–10.5)

## 2016-03-21 LAB — BASIC METABOLIC PANEL
Anion gap: 9 (ref 5–15)
BUN: 18 mg/dL (ref 6–20)
CHLORIDE: 103 mmol/L (ref 101–111)
CO2: 23 mmol/L (ref 22–32)
CREATININE: 0.79 mg/dL (ref 0.44–1.00)
Calcium: 9.5 mg/dL (ref 8.9–10.3)
GFR calc Af Amer: >60 mL/min (ref >60)
GFR calc non Af Amer: >60 mL/min (ref >60)
GLUCOSE: 161 mg/dL — AB (ref 65–99)
POTASSIUM: 3.8 mmol/L (ref 3.5–5.1)
Sodium: 135 mmol/L (ref 135–145)

## 2016-03-22 NOTE — Telephone Encounter (Signed)
See previous phone encounter.  Routing to provider for final review.  Will close encounter.

## 2016-03-24 ENCOUNTER — Ambulatory Visit (HOSPITAL_COMMUNITY)
Admission: RE | Admit: 2016-03-24 | Discharge: 2016-03-24 | Disposition: A | Discharge status: Home / Self Care | Payer: 59 | Source: Ambulatory Visit | Attending: Obstetrics & Gynecology | Admitting: Obstetrics & Gynecology

## 2016-03-24 ENCOUNTER — Encounter (HOSPITAL_COMMUNITY): Payer: Self-pay | Admitting: *Deleted

## 2016-03-24 ENCOUNTER — Ambulatory Visit (HOSPITAL_COMMUNITY): Payer: 59 | Admitting: Anesthesiology

## 2016-03-24 ENCOUNTER — Encounter (HOSPITAL_COMMUNITY)
Admission: RE | Disposition: A | Discharge status: Home / Self Care | Payer: Self-pay | Source: Ambulatory Visit | Attending: Obstetrics & Gynecology

## 2016-03-24 DIAGNOSIS — R109 Unspecified abdominal pain: Secondary | ICD-10-CM | POA: Diagnosis not present

## 2016-03-24 DIAGNOSIS — Z79899 Other long term (current) drug therapy: Secondary | ICD-10-CM | POA: Insufficient documentation

## 2016-03-24 DIAGNOSIS — F419 Anxiety disorder, unspecified: Secondary | ICD-10-CM | POA: Insufficient documentation

## 2016-03-24 DIAGNOSIS — E039 Hypothyroidism, unspecified: Secondary | ICD-10-CM | POA: Insufficient documentation

## 2016-03-24 DIAGNOSIS — N840 Polyp of corpus uteri: Secondary | ICD-10-CM | POA: Diagnosis not present

## 2016-03-24 DIAGNOSIS — E0590 Thyrotoxicosis, unspecified without thyrotoxic crisis or storm: Secondary | ICD-10-CM | POA: Insufficient documentation

## 2016-03-24 DIAGNOSIS — Z7982 Long term (current) use of aspirin: Secondary | ICD-10-CM | POA: Insufficient documentation

## 2016-03-24 DIAGNOSIS — Z955 Presence of coronary angioplasty implant and graft: Secondary | ICD-10-CM | POA: Insufficient documentation

## 2016-03-24 DIAGNOSIS — M1990 Unspecified osteoarthritis, unspecified site: Secondary | ICD-10-CM | POA: Insufficient documentation

## 2016-03-24 DIAGNOSIS — I10 Essential (primary) hypertension: Secondary | ICD-10-CM | POA: Insufficient documentation

## 2016-03-24 DIAGNOSIS — N9489 Other specified conditions associated with female genital organs and menstrual cycle: Secondary | ICD-10-CM | POA: Diagnosis not present

## 2016-03-24 DIAGNOSIS — I25111 Atherosclerotic heart disease of native coronary artery with angina pectoris with documented spasm: Secondary | ICD-10-CM | POA: Insufficient documentation

## 2016-03-24 DIAGNOSIS — K219 Gastro-esophageal reflux disease without esophagitis: Secondary | ICD-10-CM | POA: Insufficient documentation

## 2016-03-24 DIAGNOSIS — M8580 Other specified disorders of bone density and structure, unspecified site: Secondary | ICD-10-CM | POA: Insufficient documentation

## 2016-03-24 DIAGNOSIS — E785 Hyperlipidemia, unspecified: Secondary | ICD-10-CM | POA: Insufficient documentation

## 2016-03-24 HISTORY — PX: DILATATION & CURETTAGE/HYSTEROSCOPY WITH MYOSURE: SHX6511

## 2016-03-24 SURGERY — DILATATION & CURETTAGE/HYSTEROSCOPY WITH MYOSURE
Anesthesia: General

## 2016-03-24 MED ORDER — LACTATED RINGERS IV SOLN
INTRAVENOUS | Status: DC
  Administered 2016-03-24 (×2): via INTRAVENOUS

## 2016-03-24 MED ORDER — LIDOCAINE HCL 1 % IJ SOLN
INTRAMUSCULAR | Status: DC | PRN
  Administered 2016-03-24: 10 mL

## 2016-03-24 MED ORDER — LIDOCAINE HCL (CARDIAC) 20 MG/ML IV SOLN
INTRAVENOUS | Status: AC
  Filled 2016-03-24: qty 5

## 2016-03-24 MED ORDER — HYDROCODONE-ACETAMINOPHEN 5-325 MG PO TABS: 1.0000 | ORAL_TABLET | Freq: Four times a day (QID) | ORAL | 0 refills | Status: DC | PRN

## 2016-03-24 MED ORDER — SCOPOLAMINE 1 MG/3DAYS TD PT72
MEDICATED_PATCH | TRANSDERMAL | Status: AC
  Filled 2016-03-24: qty 1

## 2016-03-24 MED ORDER — LIDOCAINE-EPINEPHRINE 1 %-1:100000 IJ SOLN
INTRAMUSCULAR | Status: AC
  Filled 2016-03-24: qty 1

## 2016-03-24 MED ORDER — PROPOFOL 10 MG/ML IV BOLUS
INTRAVENOUS | Status: DC | PRN
  Administered 2016-03-24: 140 mg via INTRAVENOUS
  Administered 2016-03-24: 20 mg via INTRAVENOUS

## 2016-03-24 MED ORDER — KETOROLAC TROMETHAMINE 30 MG/ML IJ SOLN
INTRAMUSCULAR | Status: AC
  Filled 2016-03-24: qty 1

## 2016-03-24 MED ORDER — LIDOCAINE HCL (CARDIAC) 20 MG/ML IV SOLN
INTRAVENOUS | Status: DC | PRN
  Administered 2016-03-24: 80 mg via INTRAVENOUS

## 2016-03-24 MED ORDER — PROPOFOL 10 MG/ML IV BOLUS
INTRAVENOUS | Status: AC
  Filled 2016-03-24: qty 20

## 2016-03-24 MED ORDER — PHENYLEPHRINE HCL 10 MG/ML IJ SOLN
INTRAMUSCULAR | Status: DC | PRN
  Administered 2016-03-24: 40 ug via INTRAVENOUS

## 2016-03-24 MED ORDER — ONDANSETRON HCL 4 MG/2ML IJ SOLN: 4.0000 mg | Freq: Once | INTRAMUSCULAR | Status: DC | PRN

## 2016-03-24 MED ORDER — ONDANSETRON HCL 4 MG/2ML IJ SOLN
INTRAMUSCULAR | Status: DC | PRN
  Administered 2016-03-24: 4 mg via INTRAVENOUS

## 2016-03-24 MED ORDER — MEPERIDINE HCL 25 MG/ML IJ SOLN: 6.2500 mg | INTRAMUSCULAR | Status: DC | PRN

## 2016-03-24 MED ORDER — FENTANYL CITRATE (PF) 100 MCG/2ML IJ SOLN
INTRAMUSCULAR | Status: DC | PRN
  Administered 2016-03-24: 25 ug via INTRAVENOUS

## 2016-03-24 MED ORDER — MIDAZOLAM HCL 2 MG/2ML IJ SOLN
INTRAMUSCULAR | Status: AC
  Filled 2016-03-24: qty 2

## 2016-03-24 MED ORDER — KETOROLAC TROMETHAMINE 30 MG/ML IJ SOLN: 30.0000 mg | Freq: Once | INTRAMUSCULAR | Status: DC

## 2016-03-24 MED ORDER — FENTANYL CITRATE (PF) 100 MCG/2ML IJ SOLN
INTRAMUSCULAR | Status: AC
  Filled 2016-03-24: qty 2

## 2016-03-24 MED ORDER — IBUPROFEN 200 MG PO TABS
200.0000 mg | ORAL_TABLET | Freq: Four times a day (QID) | ORAL | Status: DC | PRN
  Filled 2016-03-24: qty 2

## 2016-03-24 MED ORDER — KETOROLAC TROMETHAMINE 30 MG/ML IJ SOLN
INTRAMUSCULAR | Status: DC | PRN
  Administered 2016-03-24: 15 mg via INTRAVENOUS

## 2016-03-24 MED ORDER — IBUPROFEN 100 MG/5ML PO SUSP
200.0000 mg | Freq: Four times a day (QID) | ORAL | Status: DC | PRN
  Filled 2016-03-24: qty 20

## 2016-03-24 MED ORDER — SILVER NITRATE-POT NITRATE 75-25 % EX MISC
CUTANEOUS | Status: AC
  Filled 2016-03-24: qty 1

## 2016-03-24 MED ORDER — LIDOCAINE HCL 1 % IJ SOLN
INTRAMUSCULAR | Status: AC
  Filled 2016-03-24: qty 20

## 2016-03-24 MED ORDER — NITROGLYCERIN 0.4 MG/HR TD PT24: 0.4000 mg | MEDICATED_PATCH | Freq: Once | TRANSDERMAL | Status: DC

## 2016-03-24 MED ORDER — DEXAMETHASONE SODIUM PHOSPHATE 4 MG/ML IJ SOLN
INTRAMUSCULAR | Status: DC | PRN
  Administered 2016-03-24: 4 mg via INTRAVENOUS

## 2016-03-24 MED ORDER — FENTANYL CITRATE (PF) 100 MCG/2ML IJ SOLN
25.0000 ug | INTRAMUSCULAR | Status: DC | PRN
  Administered 2016-03-24 (×2): 25 ug via INTRAVENOUS

## 2016-03-24 MED ORDER — NITROGLYCERIN 2 % TD OINT
1.0000 [in_us] | TOPICAL_OINTMENT | Freq: Four times a day (QID) | TRANSDERMAL | Status: AC
  Administered 2016-03-24: 1 [in_us] via TOPICAL
  Filled 2016-03-24: qty 30

## 2016-03-24 MED ORDER — MIDAZOLAM HCL 2 MG/2ML IJ SOLN
INTRAMUSCULAR | Status: DC | PRN
  Administered 2016-03-24: 1 mg via INTRAVENOUS

## 2016-03-24 MED ORDER — ONDANSETRON HCL 4 MG/2ML IJ SOLN
INTRAMUSCULAR | Status: AC
  Filled 2016-03-24: qty 2

## 2016-03-24 SURGICAL SUPPLY — 21 items
CANISTER SUCT 3000ML (MISCELLANEOUS) ×2 IMPLANT
CATH ROBINSON RED A/P 16FR (CATHETERS) ×2 IMPLANT
CLOTH BEACON ORANGE TIMEOUT ST (SAFETY) ×2 IMPLANT
CONTAINER PREFILL 10% NBF 60ML (FORM) ×4 IMPLANT
DEVICE MYOSURE LITE (MISCELLANEOUS) IMPLANT
DEVICE MYOSURE REACH (MISCELLANEOUS) IMPLANT
DILATOR CANAL MILEX (MISCELLANEOUS) IMPLANT
ELECT REM PT RETURN 9FT ADLT (ELECTROSURGICAL)
ELECTRODE REM PT RTRN 9FT ADLT (ELECTROSURGICAL) IMPLANT
FILTER ARTHROSCOPY CONVERTOR (FILTER) ×2 IMPLANT
GLOVE BIOGEL PI IND STRL 7.0 (GLOVE) ×2 IMPLANT
GLOVE BIOGEL PI INDICATOR 7.0 (GLOVE) ×2
GLOVE ECLIPSE 6.5 STRL STRAW (GLOVE) ×2 IMPLANT
GOWN STRL REUS W/TWL LRG LVL3 (GOWN DISPOSABLE) ×4 IMPLANT
PACK VAGINAL MINOR WOMEN LF (CUSTOM PROCEDURE TRAY) ×2 IMPLANT
PAD OB MATERNITY 4.3X12.25 (PERSONAL CARE ITEMS) ×2 IMPLANT
SEAL ROD LENS SCOPE MYOSURE (ABLATOR) ×2 IMPLANT
TOWEL OR 17X24 6PK STRL BLUE (TOWEL DISPOSABLE) ×4 IMPLANT
TUBING AQUILEX INFLOW (TUBING) ×2 IMPLANT
TUBING AQUILEX OUTFLOW (TUBING) ×2 IMPLANT
WATER STERILE IRR 1000ML POUR (IV SOLUTION) ×2 IMPLANT

## 2016-03-24 NOTE — Transfer of Care (Signed)
Immediate Anesthesia Transfer of Care Note  Patient: Veronica [name]  Procedure(s) Performed: Procedure(s): DILATATION & CURETTAGE/HYSTEROSCOPY WITH MYOSURE (N/A)  Patient Location: PACU  Anesthesia Type:General  Level of Consciousness: sedated  Airway & Oxygen Therapy: Patient Spontanous Breathing and Patient connected to nasal cannula oxygen  Post-op Assessment: Report given to RN and Post -op Vital signs reviewed and stable  Post vital signs: stable  Last Vitals:  Vitals:   03/24/16 0614  BP: 135/87  Pulse: 71  Resp: 20  Temp: 36.7 C    Last Pain:  Vitals:   03/24/16 0614  TempSrc: Oral  PainSc: 0-No pain      Patients Stated Pain Goal: 2 ([id number])  Complications: No apparent anesthesia complications

## 2016-03-24 NOTE — Discharge Instructions (Signed)

## 2016-03-24 NOTE — Op Note (Signed)
03/24/2016  9:53 AM  PATIENT:  Veronica [name]  66 y.o. female G2P2 MWF with endometrial mass  PRE-OPERATIVE DIAGNOSIS:  endometrial mass  POST-OPERATIVE DIAGNOSIS: endometrial mass  PROCEDURE:  Procedure(s): DILATATION & CURETTAGE/HYSTEROSCOPY WITH MYOSURE  SURGEON:  Glendale Youngblood SUZANNE  ASSISTANTS: OR staff   ANESTHESIA:   general and with LMA  ESTIMATED BLOOD LOSS:  5cc  BLOOD ADMINISTERED:none   FLUIDS: 1000cc LR  UOP: 75cc  SPECIMEN:  Endometrial mass and curettings  DISPOSITION OF SPECIMEN:  PATHOLOGY  FINDINGS: endometrial mass and thin endometrium  DESCRIPTION OF OPERATION: Patient was taken to the operating room.  She is placed in the supine position. SCDs were on her lower extremities and functioning properly. General anesthesia with an LMA was administered without difficulty.   Legs were then placed in the [place] in the low lithotomy position. The legs were lifted to the high lithotomy position and the Betadine prep was used on the inner thighs perineum and vagina x3. Patient was draped in a normal standard fashion. An in and out catheterization with a red rubber Foley catheter was performed. Approximately 75 cc of clear urine was noted. A bivalve speculum was placed the vagina. The anterior lip of the cervix was grasped with single-tooth tenaculum.  A paracervical block of 1% lidocaine mixed one-to-one without epinephrine was placed.  10 cc was used total. The cervix is dilated up to #21 [facility] dilators. The endometrial cavity sounded to 6.5 cm.   A Myosure diagnostic hysteroscope was obtained.  NS was used as a hysteroscopic fluid. The hysteroscope was advanced through the endocervical canal into the endometrial cavity. The tubal ostia were noted bilaterally. There was a fundal endometrial mass present.  The Myosure lite was placed and the lesion was resected.  Once the resection was complete and the hysteroscoyp removed, a #1toothed curette was used to curette  the cavity until rough gritty texture is noted in all quadrants.  The fluid deficit was 275 cc. The tenaculum was removed from the anterior lip of the cervix. The speculum was removed from the vagina. The prep was cleansed of the patient's skin. The legs are positioned back in the supine position. Sponge, lap, needle, initially counts were correct x2.  Patient was taken to recovery in stable condition.  COUNTS:  YES  PLAN OF CARE: Transfer to PACU

## 2016-03-24 NOTE — Anesthesia Procedure Notes (Cosign Needed)
Procedure Name: LMA Insertion Date/Time: 03/24/2016 7:39 AM Performed by: Barkley Boards L Pre-anesthesia Checklist: Patient identified, Emergency Drugs available, Suction available, Patient being monitored and Timeout performed Patient Re-evaluated:Patient Re-evaluated prior to inductionOxygen Delivery Method: Circle system utilized Preoxygenation: Pre-oxygenation with 100% oxygen Intubation Type: IV induction LMA: LMA inserted LMA Size: 4.0 Placement Confirmation: positive ETCO2,  CO2 detector and breath sounds checked- equal and bilateral Tube secured with: Tape Dental Injury: Teeth and Oropharynx as per pre-operative assessment  Comments: Performed by M.Louie Bun, [place]

## 2016-03-24 NOTE — H&P (Signed)
Veronica [name] is an 66 y.o. female  G2P2 MWF her for hysteroscopy and resection of endometrial mass due to finding on ultrasound of likely polyp measuring at least one centimeter.  She and I have discussed risks and alternatives and she is here and ready to proceed.  Pertinent Gynecological History: Menses: post-menopausal Bleeding: none Contraception: post menopausal status DES exposure: denies Blood transfusions: none Sexually transmitted diseases: no past history Previous GYN Procedures: none  Last mammogram: normal Date: 03/07/16 Last pap: normal Date: 02/18/16 OB History: G2, P2   Menstrual History: Patient's last menstrual period was 02/06/2010.    Past Medical History:  Diagnosis Date  . Angina, class IV ([place]) 01/2014  . Anxiety    "menopausal related", history of  . CAD S/P mLAD Bifurcation PCI: Resolute DES 2.25 mm x 18 mm(2.75 mm), 2.0 mm Angiosculpt of Diag  01/08/2014   [place] 0,1,1 midLAD-Diag lesion - progressed from Moderate to Severe 90% LAD, 70-80% Diag from 6-12 2015 --> Class IV Angina --> DES PCI with Angiosculpt PCI of Diag  FFR of m-dRCA 50-70% = 0.9   . Complication of anesthesia    extreme sore throat and hoarseness after aneathesia, also "woke up screamimg" after surgery once"  . Coronary artery disease involving native coronary artery with angina pectoris with documented spasm ([place]) 06/2013   h/o moderate 3 Vessel CAD with mLAD-Diag bifurcation ([place] 0,1,1)  . Essential hypertension    stress test 10 yrs ago.   pcp   dr Lennette Bihari little  . GERD (gastroesophageal reflux disease)   . Headache    Migraines childhood  . History of migraine headaches    "as a child"  . Hyperlipemia   . Hyperthyroidism    "had radioactive iodine tx in the 1980's"  . Hypothyroidism   . Osteoarthritis    "joints ache"  . Osteopenia     Past Surgical History:  Procedure Laterality Date  . BACK SURGERY    . BREAST CYST ASPIRATION Left 04/1999   "done in dr's office"  .  CARDIAC CATHETERIZATION  06/2013  . CARDIAC CATHETERIZATION  01/08/2014   Procedure: INTRAVASCULAR PRESSURE WIRE/FFR STUDY;  Surgeon: Sinclair Grooms, MD;  Location: [facility] CATH LAB;  Service: Cardiovascular;;  distal RCA  . CARDIAC CATHETERIZATION  01/08/2014   Procedure: CORONARY BALLOON ANGIOPLASTY;  Surgeon: Sinclair Grooms, MD;  Location: [facility] CATH LAB;  Service: Cardiovascular;;  diag  . COLONOSCOPY    . CORONARY ANGIOPLASTY WITH STENT PLACEMENT  01/08/2014   "1"  . DILATION AND CURETTAGE OF UTERUS    . FRACTURE SURGERY    . LAPAROSCOPIC CHOLECYSTECTOMY  4/97  . LEFT HEART CATHETERIZATION WITH CORONARY ANGIOGRAM N/A 07/04/2013   Procedure: LEFT HEART CATHETERIZATION WITH CORONARY ANGIOGRAM;  Surgeon: Sinclair Grooms, MD;  Location: [facility] CATH LAB;  Service: Cardiovascular;  Laterality: N/A;  . LEFT HEART CATHETERIZATION WITH CORONARY ANGIOGRAM N/A 01/08/2014   Procedure: LEFT HEART CATHETERIZATION WITH CORONARY ANGIOGRAM;  Surgeon: Sinclair Grooms, MD;  Location: [facility] CATH LAB;  Service: Cardiovascular;  Laterality: N/A;  . MOUTH SURGERY    . PERCUTANEOUS STENT INTERVENTION  01/08/2014   Procedure: PERCUTANEOUS STENT INTERVENTION;  Surgeon: Sinclair Grooms, MD;  Location: [facility] CATH LAB;  Service: Cardiovascular;;  MID LAD  . POSTERIOR CERVICAL FUSION/FORAMINOTOMY  04/04/2011   Procedure: POSTERIOR CERVICAL FUSION/FORAMINOTOMY LEVEL 1;  Surgeon: Charlie Pitter, MD;  Location: [place] NEURO ORS;  Service: Neurosurgery;  Laterality: Left;  Left Cervical  Six-Seven Laminectomy, Foraminotomy, Diskectomy   . TONSILLECTOMY    . WISDOM TOOTH EXTRACTION    . WRIST FRACTURE SURGERY Right 1990's    Family History  Problem Relation Age of Onset  . Heart disease Father   . Hypertension Father   . Diabetes type II Father   . Heart disease Mother   . Hypertension Mother     Social History:  reports that she has never smoked. She has never used smokeless tobacco. She reports that she does not drink alcohol or  use drugs.  Allergies:  Allergies  Allergen Reactions  . Ace Inhibitors Cough  . Prilosec [Omeprazole Magnesium] Other (See Comments)    Blisters and skin peels off  . Sulfa Antibiotics Other (See Comments)    Blisters and skin peels off  . Ultram [Tramadol Hcl]     Lapse of memory      Prescriptions Prior to Admission  Medication Sig Dispense Refill Last Dose  . ALPRAZolam (XANAX) 0.5 MG tablet Take 0.5 mg by mouth at bedtime as needed for sleep. For sleep    03/23/2016 at 2000  . aspirin 81 MG chewable tablet Chew 1 tablet (81 mg total) by mouth daily.   Past Week at Unknown time  . augmented betamethasone dipropionate (DIPROLENE-AF) 0.05 % cream Apply 1 application topically daily as needed (for skin irritation).    Past Week at Unknown time  . Cholecalciferol (VITAMIN D3) 2000 units TABS Take 1 tablet by mouth daily.    03/23/2016 at 2000  . cyclobenzaprine (FLEXERIL) 10 MG tablet Take 10 mg by mouth 3 (three) times daily as needed for muscle spasms.   0 Past Week at Unknown time  . estradiol (ESTRACE) 0.1 MG/GM vaginal cream 1 gram vaginally twice weekly (Patient taking differently: Place 1 Applicatorful vaginally daily as needed. 1 gram vaginally daily prn) 42.5 g 1 Past Week at Unknown time  . ezetimibe (ZETIA) 10 MG tablet Take 1 tablet (10 mg total) by mouth daily. 30 tablet 11 03/24/2016 at 0500  . famotidine (PEPCID) 20 MG tablet Take 20 mg by mouth 2 (two) times daily as needed for heartburn or indigestion.   03/24/2016 at 0500  . fish oil-omega-3 fatty acids 1000 MG capsule Take 1 g by mouth daily.   Past Week at Unknown time  . halobetasol (ULTRAVATE) 0.05 % cream Apply 1 application topically daily as needed (for skin irritation).    Past Week at Unknown time  . hydrocortisone valerate cream (WESTCORT) 0.2 % Apply 1 application topically 2 (two) times daily as needed (rash).    Past Week at Unknown time  . levothyroxine (SYNTHROID, LEVOTHROID) 100 MCG tablet Take 100 mcg by  mouth daily before breakfast.   03/24/2016 at 0500  . metoprolol tartrate (LOPRESSOR) 25 MG tablet Take 0.5 tablets (12.5 mg total) by mouth daily. 15 tablet 11 03/24/2016 at 0500  . Multiple Vitamin (MULITIVITAMIN WITH MINERALS) TABS Take 1 tablet by mouth daily.   Past Week at Unknown time  . nitroGLYCERIN (NITROSTAT) 0.4 MG SL tablet Place 1 tablet (0.4 mg total) under the tongue every 5 (five) minutes as needed for chest pain. 25 tablet 3 Past Week at Unknown time  . rosuvastatin (CRESTOR) 40 MG tablet Take 40 mg by mouth daily.   03/24/2016 at 0500    Review of Systems  All other systems reviewed and are negative.   Blood pressure 135/87, pulse 71, temperature 98.1 F (36.7 C), temperature source Oral, resp. rate 20, height  5' 3.75" (1.619 m), weight 186 lb (84.4 kg), last menstrual period 02/06/2010, SpO2 100 %. Physical Exam  Constitutional: She is oriented to person, place, and time. She appears well-developed and well-nourished.  Cardiovascular: Normal rate and regular rhythm.   Respiratory: Effort normal and breath sounds normal.  Neurological: She is alert and oriented to person, place, and time.  Skin: Skin is warm and dry.  Psychiatric: She has a normal mood and affect.    No results found for this or any previous visit (from the past 24 hour(s)).  No results found.  Assessment/Plan: 66 yo G2P2 MWF here for hysteroscopy and removal of endometrial mass.  Risks have been reviewed, questions welcomed and answered this morning.  Pt is ready to proceed.  Hale Bogus SUZANNE 03/24/2016, 6:59 AM

## 2016-03-24 NOTE — Anesthesia Postprocedure Evaluation (Addendum)
Anesthesia Post Note  Patient: Veronica [name]  Procedure(s) Performed: Procedure(s) (LRB): DILATATION & CURETTAGE/HYSTEROSCOPY WITH MYOSURE (N/A)  Patient location during evaluation: PACU Anesthesia Type: General Level of consciousness: awake Vital Signs Assessment: post-procedure vital signs reviewed and stable Respiratory status: spontaneous breathing Cardiovascular status: stable Postop Assessment: no signs of nausea or vomiting Anesthetic complications: no        Last Vitals:  Vitals:   03/24/16 0900 03/24/16 0915  BP: 114/69 128/69  Pulse: 62 60  Resp: 14 11  Temp:      Last Pain:  Vitals:   03/24/16 0900  TempSrc:   PainSc: 0-No pain   Pain Goal: Patients Stated Pain Goal: 6 (03/24/16 0837)               [place]

## 2016-03-24 NOTE — Anesthesia Preprocedure Evaluation (Signed)
Anesthesia Evaluation  Patient identified by MRN, date of birth, ID band Patient awake    Reviewed: Allergy & Precautions, NPO status , Patient's Chart, lab work & pertinent test results  Airway Mallampati: II       Dental no notable dental hx.    Pulmonary neg pulmonary ROS,    Pulmonary exam normal        Cardiovascular hypertension, Pt. on home beta blockers Normal cardiovascular exam     Neuro/Psych PSYCHIATRIC DISORDERS Anxiety    GI/Hepatic Neg liver ROS,   Endo/Other  negative endocrine ROS  Renal/GU negative Renal ROS  negative genitourinary   Musculoskeletal negative musculoskeletal ROS (+)   Abdominal (+) + obese,   Peds negative pediatric ROS (+)  Hematology negative hematology ROS (+)   Anesthesia Other Findings   Reproductive/Obstetrics negative OB ROS                             Anesthesia Physical Anesthesia Plan  ASA: III  Anesthesia Plan: General   Post-op Pain Management:    Induction: Intravenous  Airway Management Planned: LMA  Additional Equipment:   Intra-op Plan:   Post-operative Plan:   Informed Consent: I have reviewed the patients History and Physical, chart, labs and discussed the procedure including the risks, benefits and alternatives for the proposed anesthesia with the patient or authorized representative who has indicated his/her understanding and acceptance.     Plan Discussed with: CRNA and Surgeon  Anesthesia Plan Comments: (12 lead ECG in holding area 5 lead ECG in OR with a V5 lead 1 inch of Nitropaste prior to OR No epinephrine in the local)        Anesthesia Quick Evaluation

## 2016-03-26 ENCOUNTER — Encounter (HOSPITAL_COMMUNITY): Payer: Self-pay | Admitting: Obstetrics & Gynecology

## 2016-03-30 ENCOUNTER — Telehealth: Payer: Self-pay

## 2016-03-30 NOTE — Telephone Encounter (Signed)
Spoke with patient. Advised of results as seen below from [place]. Patient verbalizes understanding. States that she is feeling well today. No questions or concerns at this time.  Routing to provider for final review. Patient agreeable to disposition. Will close encounter.

## 2016-03-30 NOTE — Telephone Encounter (Signed)
-----   Message from Megan Salon, MD sent at 03/30/2016  8:52 AM EST ----- Please call pt and let her know her pathology was actually a uterine fibroid.  Tissue was all benign.  Nothing else is needed for this.  I'll see her at her post op.  Can you make sure she is doing well?  Thanks.

## 2016-04-10 NOTE — Progress Notes (Signed)
Post Operative Visit  Procedure: DILATATION & CURETTAGE/HYSTEROSCOPY WITH MYOSURE Days Post-op:  2 weeks, 4 days   Subjective: Doing well.  Still having a little spotting.  Did not need anything for pain medication.  Did have some post op constipation but this is back to normal.  Pictures and pathology were reviewed with pt. Objective: BP 122/70 (BP Location: Right Arm, Patient Position: Sitting, Cuff Size: Large)   Pulse 88   Resp 16   Ht 5' 3.75" (1.619 m)   Wt 181 lb (82.1 kg)   LMP 02/06/2010   BMI 31.31 kg/m   EXAM General: alert and cooperative GI: soft, non-tender; bowel sounds normal; no masses,  no organomegaly Extremities: extremities normal, atraumatic, no cyanosis or edema Vaginal Bleeding: none  Assessment: s/p hysteroscopy with resection of fibroid  Plan: Follow up for AEX that is already scheduled.

## 2016-04-11 ENCOUNTER — Ambulatory Visit (INDEPENDENT_AMBULATORY_CARE_PROVIDER_SITE_OTHER): Payer: 59 | Admitting: Obstetrics & Gynecology

## 2016-04-11 ENCOUNTER — Encounter: Payer: Self-pay | Admitting: Obstetrics & Gynecology

## 2016-04-11 VITALS — BP 122/70 | HR 88 | Resp 16 | Ht 63.75 in | Wt 181.0 lb

## 2016-04-11 DIAGNOSIS — D250 Submucous leiomyoma of uterus: Secondary | ICD-10-CM | POA: Diagnosis not present

## 2016-05-02 ENCOUNTER — Encounter: Payer: 59 | Attending: Family Medicine | Admitting: Dietician

## 2016-05-02 ENCOUNTER — Encounter: Payer: Self-pay | Admitting: Dietician

## 2016-05-02 DIAGNOSIS — E119 Type 2 diabetes mellitus without complications: Secondary | ICD-10-CM | POA: Diagnosis not present

## 2016-05-02 DIAGNOSIS — Z713 Dietary counseling and surveillance: Secondary | ICD-10-CM | POA: Diagnosis present

## 2016-05-02 NOTE — Progress Notes (Signed)
Patient was seen on 05/02/16 for the first of a series of three diabetes self-management courses at [facility].  Patient Education Plan per assessed needs and concerns is to attend four course education program for Diabetes Self Management Education.  The following learning objectives were met by the patient during this class:  Describe diabetes  State some common risk factors for diabetes  Defines the role of glucose and insulin  Identifies type of diabetes and pathophysiology  Describe the relationship between diabetes and cardiovascular risk  State the members of the Healthcare Team  States the rationale for glucose monitoring  State when to test glucose  State their individual Target Range  State the importance of logging glucose readings  Describe how to interpret glucose readings  Identifies A1C target  Explain the correlation between A1c and eAG values  State symptoms and treatment of high blood glucose  State symptoms and treatment of low blood glucose  Explain proper technique for glucose testing  Identifies proper sharps disposal  Handouts given during class include:  Living Well with Diabetes book  Carb Counting and Meal Planning book  Meal Plan Card  Carbohydrate guide  Meal planning worksheet  Low Sodium Flavoring Tips  The diabetes portion plate  X5M to eAG Conversion Chart  Diabetes Medications  Diabetes Recommended Care Schedule  Support Group  Diabetes Success Plan  Core Class Satisfaction Survey  Follow-Up Plan:  Attend core 2

## 2016-05-04 ENCOUNTER — Ambulatory Visit (INDEPENDENT_AMBULATORY_CARE_PROVIDER_SITE_OTHER): Payer: 59 | Admitting: Podiatry

## 2016-05-04 ENCOUNTER — Encounter: Payer: Self-pay | Admitting: Podiatry

## 2016-05-04 DIAGNOSIS — L600 Ingrowing nail: Secondary | ICD-10-CM

## 2016-05-04 DIAGNOSIS — B351 Tinea unguium: Secondary | ICD-10-CM | POA: Diagnosis not present

## 2016-05-04 DIAGNOSIS — E119 Type 2 diabetes mellitus without complications: Secondary | ICD-10-CM

## 2016-05-04 NOTE — Progress Notes (Signed)
   Subjective:    Patient ID: Veronica [name], female    DOB: 1950/02/17, 66 y.o.   MRN: [id number]  HPI  66 year old female presents the office they for diabetic foot evaluation. She was recently diagnosed with diabetes. She denies any claudication symptoms and she denies any numbness or tingling. She also states that she's had toenail fungus for over 40 years and she is tried multiple topical both over-the-counter remedies as well as medications without any significant improvement. She denies any pain to the toenails but she occasionally gets ingrown toenail the outside records both of her big toes. Denies any drainage or pus. She has no other complaints today.     Review of Systems  All other systems reviewed and are negative.      Objective:   Physical Exam General: AAO x3, NAD  Dermatological: Right hallux as well as left third through fifth digits are hypertrophic, dystrophic, discolored with yellow to brown discoloration. There is mild incurvation along the lateral aspect of bilateral hallux toenails. There is no edema, erythema, drainage pus. There is no conical signs of infection. No open lesions identified at this time.  Vascular: Dorsalis Pedis artery and Posterior Tibial artery pedal pulses are 2/4 bilateral with immedate capillary fill time. There is no pain with calf compression, swelling, warmth, erythema.   Neruologic: Grossly intact via light touch bilateral. Vibratory intact via tuning fork bilateral. Protective threshold with Semmes Wienstein monofilament intact to all pedal sites bilateral.   Musculoskeletal: No gross boney pedal deformities bilateral. No pain, crepitus, or limitation noted with foot and ankle range of motion bilateral. Muscular strength 5/5 in all groups tested bilateral.  Gait: Unassisted, Nonantalgic.      Assessment & Plan:  66 year old female presents for diabetic foot evaluation as well as for onychodystrophy likely onychomycosis -Treatment  options discussed including all alternatives, risks, and complications -Etiology of symptoms were discussed -Discussed treatment options for nails. She was stop any oral medication. I debrided the symptomatic toenails without any couple occasions or bleeding to the ingrown portion glasses sent these for culture/biopsy to evaluate. Will await results before definitive treatment. -Discussed daily foot inspection. From a diabetic standpoint she is doing well. I'll see her back in one year for this.  Celesta Gentile, DPM

## 2016-05-04 NOTE — Patient Instructions (Signed)

## 2016-05-09 ENCOUNTER — Encounter: Payer: 59 | Attending: Family Medicine | Admitting: Dietician

## 2016-05-09 DIAGNOSIS — Z713 Dietary counseling and surveillance: Secondary | ICD-10-CM | POA: Insufficient documentation

## 2016-05-09 DIAGNOSIS — E119 Type 2 diabetes mellitus without complications: Secondary | ICD-10-CM | POA: Diagnosis not present

## 2016-05-11 NOTE — Progress Notes (Signed)

## 2016-05-16 ENCOUNTER — Encounter: Payer: 59 | Admitting: Dietician

## 2016-05-16 ENCOUNTER — Telehealth: Payer: Self-pay | Admitting: *Deleted

## 2016-05-16 DIAGNOSIS — E119 Type 2 diabetes mellitus without complications: Secondary | ICD-10-CM

## 2016-05-16 DIAGNOSIS — Z713 Dietary counseling and surveillance: Secondary | ICD-10-CM | POA: Diagnosis not present

## 2016-05-16 MED ORDER — NONFORMULARY OR COMPOUNDED ITEM: 2 refills | Status: DC

## 2016-05-16 NOTE — Telephone Encounter (Addendum)
-----   Message from Trula Slade, DPM sent at 05/16/2016  2:25 PM EDT ----- + fungus. We can start with the topical compound through [facility] and if not improved can do lamisil. Please let her know. Thanks. I informed pt of Dr. Leigh Aurora orders and informed [place] information and delivery process.

## 2016-05-16 NOTE — Progress Notes (Signed)
Patient was seen on 05/16/16 for the third of a series of three diabetes self-management courses at the [facility].   Veronica [name] the amount of activity recommended for healthy living . Describe activities suitable for individual needs . Identify ways to regularly incorporate activity into daily life . Identify barriers to activity and ways to over come these barriers  Identify diabetes medications being personally used and their primary action for lowering glucose and possible side effects . Describe role of stress on blood glucose and develop strategies to address psychosocial issues . Identify diabetes complications and ways to prevent them  Explain how to manage diabetes during illness . Evaluate success in meeting personal goal . Establish 2-3 goals that they will plan to diligently work on until they return for the  59-month follow-up visit  Goals:   I will be active at least four hours total per week  I will eat less unhealthy fats.  I will test my glucose at least 1 times a day, 7 days a week   Your patient has identified their diabetes self-care support plan as   Magazine Subscriptions Plan:  Attend Support Group as desired

## 2016-06-05 ENCOUNTER — Telehealth: Payer: Self-pay | Admitting: *Deleted

## 2016-06-05 MED ORDER — NONFORMULARY OR COMPOUNDED ITEM: 2 refills | Status: DC

## 2016-06-05 NOTE — Telephone Encounter (Signed)
Refill request for [facility] Onychomycosis Nail lacquer.  Dr. Berton Lan refill +2refills. Faxed orders to [place].

## 2016-07-14 NOTE — Addendum Note (Signed)
Addendum  created 07/14/16 1015 by Rondle Lohse, MD   Sign clinical note    

## 2016-12-07 ENCOUNTER — Other Ambulatory Visit: Payer: Self-pay | Admitting: Family Medicine

## 2016-12-07 DIAGNOSIS — M5416 Radiculopathy, lumbar region: Secondary | ICD-10-CM

## 2016-12-19 ENCOUNTER — Ambulatory Visit
Admission: RE | Admit: 2016-12-19 | Discharge: 2016-12-19 | Disposition: A | Discharge status: Home / Self Care | Payer: 59 | Source: Ambulatory Visit | Attending: Family Medicine | Admitting: Family Medicine

## 2016-12-19 DIAGNOSIS — M5416 Radiculopathy, lumbar region: Secondary | ICD-10-CM

## 2017-01-23 DIAGNOSIS — M5416 Radiculopathy, lumbar region: Secondary | ICD-10-CM | POA: Insufficient documentation

## 2017-01-23 DIAGNOSIS — M48061 Spinal stenosis, lumbar region without neurogenic claudication: Secondary | ICD-10-CM | POA: Insufficient documentation

## 2017-02-01 ENCOUNTER — Telehealth: Payer: Self-pay | Admitting: *Deleted

## 2017-02-01 MED ORDER — NONFORMULARY OR COMPOUNDED ITEM: 11 refills | Status: DC

## 2017-02-01 NOTE — Telephone Encounter (Signed)
Received request for refill of [facility] Onychomycosis nail lacquer. Dr. Jacqualyn Posey ordered refill prn one year.

## 2017-02-15 NOTE — Progress Notes (Signed)
Cardiology Office Note    Date:  02/16/2017   ID:  Veronica [name], DOB 05-Apr-1950, MRN [id number]  PCP:  Hulan Fess, MD  Cardiologist: Sinclair Grooms, MD   Chief Complaint  Patient presents with  . Coronary Artery Disease    History of Present Illness:  Veronica [name] is a 67 y.o. female with mid to distal LAD drug-eluting stent placed 2015, diabetes, and hypertension.  Over the past month she has experienced nocturnal episodes of chest discomfort that have awakened her from sleep.  She otherwise is doing great.  She continues an active lifestyle including exercise programs without any evidence of chest discomfort or change in exertional tolerance.  Chest discomfort episodes usually last less than 5 minutes but they have been awakening her from sleep.  Not usually more than one episode occurs.  This may happen 2-3 times per week over this 1 month timeframe.  Interim diagnosis of diabetes mellitus.  Past Medical History:  Diagnosis Date  . Angina, class IV ([place]) 01/2014  . Anxiety    "menopausal related", history of  . CAD S/P mLAD Bifurcation PCI: Resolute DES 2.25 mm x 18 mm(2.75 mm), 2.0 mm Angiosculpt of Diag  01/08/2014   [place] 0,1,1 midLAD-Diag lesion - progressed from Moderate to Severe 90% LAD, 70-80% Diag from 6-12 2015 --> Class IV Angina --> DES PCI with Angiosculpt PCI of Diag  FFR of m-dRCA 50-70% = 0.9   . Complication of anesthesia    extreme sore throat and hoarseness after aneathesia, also "woke up screamimg" after surgery once"  . Coronary artery disease involving native coronary artery with angina pectoris with documented spasm ([place]) 06/2013   h/o moderate 3 Vessel CAD with mLAD-Diag bifurcation ([place] 0,1,1)  . Diabetes mellitus without complication ([place])   . Essential hypertension    stress test 10 yrs ago.   pcp   dr Lennette Bihari little  . GERD (gastroesophageal reflux disease)   . Headache    Migraines childhood  . History of migraine headaches    "as a  child"  . Hyperlipemia   . Hyperthyroidism    "had radioactive iodine tx in the 1980's"  . Hypothyroidism   . Osteoarthritis    "joints ache"  . Osteopenia     Past Surgical History:  Procedure Laterality Date  . BACK SURGERY    . BREAST CYST ASPIRATION Left 04/1999   "done in dr's office"  . CARDIAC CATHETERIZATION  06/2013  . CARDIAC CATHETERIZATION  01/08/2014   Procedure: INTRAVASCULAR PRESSURE WIRE/FFR STUDY;  Surgeon: Sinclair Grooms, MD;  Location: [facility] CATH LAB;  Service: Cardiovascular;;  distal RCA  . CARDIAC CATHETERIZATION  01/08/2014   Procedure: CORONARY BALLOON ANGIOPLASTY;  Surgeon: Sinclair Grooms, MD;  Location: [facility] CATH LAB;  Service: Cardiovascular;;  diag  . COLONOSCOPY    . CORONARY ANGIOPLASTY WITH STENT PLACEMENT  01/08/2014   "1"  . DILATATION & CURETTAGE/HYSTEROSCOPY WITH MYOSURE N/A 03/24/2016   Procedure: DILATATION & CURETTAGE/HYSTEROSCOPY WITH MYOSURE;  Surgeon: Megan Salon, MD;  Location: [facility];  Service: Gynecology;  Laterality: N/A;  . DILATION AND CURETTAGE OF UTERUS    . FRACTURE SURGERY    . LAPAROSCOPIC CHOLECYSTECTOMY  4/97  . LEFT HEART CATHETERIZATION WITH CORONARY ANGIOGRAM N/A 07/04/2013   Procedure: LEFT HEART CATHETERIZATION WITH CORONARY ANGIOGRAM;  Surgeon: Sinclair Grooms, MD;  Location: [facility] CATH LAB;  Service: Cardiovascular;  Laterality: N/A;  . LEFT HEART CATHETERIZATION WITH CORONARY  ANGIOGRAM N/A 01/08/2014   Procedure: LEFT HEART CATHETERIZATION WITH CORONARY ANGIOGRAM;  Surgeon: Sinclair Grooms, MD;  Location: [facility] CATH LAB;  Service: Cardiovascular;  Laterality: N/A;  . MOUTH SURGERY    . PERCUTANEOUS STENT INTERVENTION  01/08/2014   Procedure: PERCUTANEOUS STENT INTERVENTION;  Surgeon: Sinclair Grooms, MD;  Location: [facility] CATH LAB;  Service: Cardiovascular;;  MID LAD  . POSTERIOR CERVICAL FUSION/FORAMINOTOMY  04/04/2011   Procedure: POSTERIOR CERVICAL FUSION/FORAMINOTOMY LEVEL 1;  Surgeon: Charlie Pitter, MD;  Location: [place]  [facility];  Service: Neurosurgery;  Laterality: Left;  Left Cervical Six-Seven Laminectomy, Foraminotomy, Diskectomy   . TONSILLECTOMY    . WISDOM TOOTH EXTRACTION    . WRIST FRACTURE SURGERY Right 1990's    Current Medications: Outpatient Medications Prior to Visit  Medication Sig Dispense Refill  . aspirin 81 MG chewable tablet Chew 1 tablet (81 mg total) by mouth daily.    [name] augmented betamethasone dipropionate (DIPROLENE-AF) 0.05 % cream Apply 1 application topically daily as needed (for skin irritation).     . Cholecalciferol (VITAMIN D3) 2000 units TABS Take 1 tablet by mouth daily.     . cyclobenzaprine (FLEXERIL) 10 MG tablet Take 10 mg by mouth 3 (three) times daily as needed for muscle spasms.   0  . estradiol (ESTRACE) 0.1 MG/GM vaginal cream 1 gram vaginally twice weekly (Patient taking differently: Place 1 Applicatorful vaginally daily as needed. 1 gram vaginally daily prn) 42.5 g 1  . ezetimibe (ZETIA) 10 MG tablet Take 1 tablet (10 mg total) by mouth daily. 30 tablet 11  . famotidine (PEPCID) 20 MG tablet Take 20 mg by mouth 2 (two) times daily as needed for heartburn or indigestion.    . fish oil-omega-3 fatty acids 1000 MG capsule Take 1 g by mouth daily.    . halobetasol (ULTRAVATE) 0.05 % cream Apply 1 application topically daily as needed (for skin irritation).     . hydrocortisone valerate cream (WESTCORT) 0.2 % Apply 1 application topically 2 (two) times daily as needed (rash).     [name] levothyroxine (SYNTHROID, LEVOTHROID) 88 MCG tablet Take 100 mcg by mouth daily before breakfast.    . losartan (COZAAR) 50 MG tablet Take 50 mg by mouth 2 (two) times daily.    . metoprolol tartrate (LOPRESSOR) 25 MG tablet Take 0.5 tablets (12.5 mg total) by mouth daily. 15 tablet 11  . nitroGLYCERIN (NITROSTAT) 0.4 MG SL tablet Place 1 tablet (0.4 mg total) under the tongue every 5 (five) minutes as needed for chest pain. 25 tablet 3  . NONFORMULARY OR COMPOUNDED ITEM [facility]:  Onychomycosis nail lacquer - fluconazole 2%, Terbinafine 1%, DMSO apply to affected area daily. 120 each 11  . rosuvastatin (CRESTOR) 40 MG tablet Take 40 mg by mouth daily.    [name] ALPRAZolam (XANAX) 0.5 MG tablet Take 0.5 mg by mouth at bedtime as needed for sleep. For sleep     . losartan (COZAAR) 50 MG tablet Take 50 mg by mouth daily.     No facility-administered medications prior to visit.      Allergies:   Ace inhibitors; Prilosec [omeprazole magnesium]; Sulfa antibiotics; and Ultram [tramadol hcl]   Social History   Socioeconomic History  . Marital status: Married    Spouse name: None  . Number of children: None  . Years of education: None  . Highest education level: None  Social Needs  . Financial resource strain: None  . Food insecurity - worry: None  .  Food insecurity - inability: None  . Transportation needs - medical: None  . Transportation needs - non-medical: None  Occupational History  . None  Tobacco Use  . Smoking status: Never Smoker  . Smokeless tobacco: Never Used  Substance and Sexual Activity  . Alcohol use: No  . Drug use: No  . Sexual activity: Yes    Partners: Male    Birth control/protection: Post-menopausal  Other Topics Concern  . None  Social History Narrative  . None     Family History:  The patient's family history includes Diabetes type II in her father; Heart disease in her father and mother; Hypertension in her father and mother.   ROS:   Please see the history of present illness.    She is having lumbar disc issues L5-S1 with right leg radiculopathy.  This is causing some limitation in physical activity.  The back discomfort is been going on for approximately 6 months. All other systems reviewed and are negative.   PHYSICAL EXAM:   VS:  BP 128/76   Pulse 68   Ht 5\' 3"  (1.6 m)   Wt 169 lb 12.8 oz (77 kg)   LMP 02/06/2010   BMI 30.08 kg/m    GEN: Well nourished, well developed, in no acute distress  HEENT: normal  Neck: no JVD,  carotid bruits, or masses Cardiac: RRR; no murmurs, rubs, or gallops,no edema  Respiratory:  clear to auscultation bilaterally, normal work of breathing GI: soft, nontender, nondistended, + BS MS: no deformity or atrophy  Skin: warm and dry, no rash Neuro:  Alert and Oriented x 3, Strength and sensation are intact Psych: euthymic mood, full affect  Wt Readings from Last 3 Encounters:  02/16/17 169 lb 12.8 oz (77 kg)  05/02/16 179 lb 12.8 oz (81.6 kg)  04/11/16 181 lb (82.1 kg)      Studies/Labs Reviewed:   EKG:  EKG normal sinus rhythm, otherwise normal tracing.  Recent Labs: 03/21/2016: BUN 18; Creatinine, Ser 0.79; Hemoglobin 14.9; Platelets 194; Potassium 3.8; Sodium 135   Lipid Panel    Component Value Date/Time   CHOL 143 10/14/2014 0736   TRIG 152.0 (H) 10/14/2014 0736   HDL 51.00 10/14/2014 0736   CHOLHDL 3 10/14/2014 0736   VLDL 30.4 10/14/2014 0736   LDLCALC 62 10/14/2014 0736    Additional studies/ records that were reviewed today include:  No new data    ASSESSMENT:    1. CAD S/P mLAD Bifurcation PCI: Resolute DES 2.25 mm x 18 mm(2.75 mm), 2.0 mm Angiosculpt of Diag    2. Essential hypertension   3. Mixed hyperlipidemia      PLAN:  In order of problems listed above:  1. Recurrence of angina, at rest.  Plan stress myocardial perfusion study to exclude significant ischemia.  Nitroglycerin for recurrent episodes of chest pain. 2. Continue current therapy.  Current pressure recordings are normal. 3. LDL target of less than 70 is being achieved with recent laboratory data from [facility] demonstrating an LDL of 67.  No recent hemoglobin A1c is available.  Clinical follow-up in 1 year unless stress test demonstrates significant abnormality.  Nitroglycerin prescription was renewed.    Medication Adjustments/Labs and Tests Ordered: Current medicines are reviewed at length with the patient today.  Concerns regarding medicines are outlined above.  Medication  changes, Labs and Tests ordered today are listed in the Patient Instructions below. There are no Patient Instructions on file for this visit.   Signed, Belva Crome  III, MD  02/16/2017 9:00 AM    [facility] [place] [place], [place], [place]  [address] Phone: [phone]; Fax: [phone]

## 2017-02-16 ENCOUNTER — Encounter: Payer: Self-pay | Admitting: Interventional Cardiology

## 2017-02-16 ENCOUNTER — Ambulatory Visit: Payer: 59 | Admitting: Interventional Cardiology

## 2017-02-16 ENCOUNTER — Encounter (INDEPENDENT_AMBULATORY_CARE_PROVIDER_SITE_OTHER): Payer: Self-pay

## 2017-02-16 VITALS — BP 128/76 | HR 68 | Ht 63.0 in | Wt 169.8 lb

## 2017-02-16 DIAGNOSIS — I2510 Atherosclerotic heart disease of native coronary artery without angina pectoris: Secondary | ICD-10-CM | POA: Diagnosis not present

## 2017-02-16 DIAGNOSIS — I10 Essential (primary) hypertension: Secondary | ICD-10-CM

## 2017-02-16 DIAGNOSIS — E782 Mixed hyperlipidemia: Secondary | ICD-10-CM

## 2017-02-16 DIAGNOSIS — Z9861 Coronary angioplasty status: Secondary | ICD-10-CM | POA: Diagnosis not present

## 2017-02-16 DIAGNOSIS — R079 Chest pain, unspecified: Secondary | ICD-10-CM

## 2017-02-16 MED ORDER — NITROGLYCERIN 0.4 MG SL SUBL: 0.4000 mg | SUBLINGUAL_TABLET | SUBLINGUAL | 3 refills | Status: DC | PRN

## 2017-02-16 NOTE — Patient Instructions (Signed)
Medication Instructions:  Your physician recommends that you continue on your current medications as directed. Please refer to the Current Medication list given to you today.  Labwork: None  Testing/Procedures: Your physician has requested that you have en exercise stress myoview. For further information please visit [email or web address]. Please follow instruction sheet, as given.   Follow-Up: Your physician wants you to follow-up in: 1 year with Dr. Tamala Julian.  You will receive a reminder letter in the mail two months in advance. If you don't receive a letter, please call our office to schedule the follow-up appointment.   Any Other Special Instructions Will Be Listed Below (If Applicable).     If you need a refill on your cardiac medications before your next appointment, please call your pharmacy.

## 2017-02-19 ENCOUNTER — Telehealth (HOSPITAL_COMMUNITY): Payer: Self-pay | Admitting: *Deleted

## 2017-02-19 NOTE — Telephone Encounter (Signed)
Patient given detailed instructions per Myocardial Perfusion Study Information Sheet for the test on 02/22/17 at 0945. Patient notified to arrive 15 minutes early and that it is imperative to arrive on time for appointment to keep from having the test rescheduled.  If you need to cancel or reschedule your appointment, please call the office within 24 hours of your appointment. . Patient verbalized understanding.Tynetta Bachmann W    

## 2017-02-22 ENCOUNTER — Ambulatory Visit (HOSPITAL_COMMUNITY): Payer: 59 | Attending: Internal Medicine

## 2017-02-22 DIAGNOSIS — R079 Chest pain, unspecified: Secondary | ICD-10-CM | POA: Diagnosis not present

## 2017-02-22 DIAGNOSIS — I510 Cardiac septal defect, acquired: Secondary | ICD-10-CM | POA: Insufficient documentation

## 2017-02-22 LAB — MYOCARDIAL PERFUSION IMAGING
CHL CUP NUCLEAR SDS: 4
CHL CUP NUCLEAR SRS: 10
CHL CUP NUCLEAR SSS: 11
CHL CUP RESTING HR STRESS: 75 {beats}/min
CSEPED: 6 min
CSEPEDS: 0 s
CSEPHR: 88 %
CSEPPHR: 137 {beats}/min
Estimated workload: 7 METS
LV dias vol: 46 mL (ref 46–106)
LVSYSVOL: 16 mL
MPHR: 156 {beats}/min
RATE: 0.29
TID: 1.04

## 2017-02-22 MED ORDER — TECHNETIUM TC 99M TETROFOSMIN IV KIT
32.9000 | PACK | Freq: Once | INTRAVENOUS | Status: AC | PRN
  Administered 2017-02-22: 32.9 via INTRAVENOUS
  Filled 2017-02-22: qty 33

## 2017-02-22 MED ORDER — TECHNETIUM TC 99M TETROFOSMIN IV KIT
11.0000 | PACK | Freq: Once | INTRAVENOUS | Status: AC | PRN
  Administered 2017-02-22: 11 via INTRAVENOUS
  Filled 2017-02-22: qty 11

## 2017-02-23 ENCOUNTER — Telehealth: Payer: Self-pay | Admitting: Interventional Cardiology

## 2017-02-23 ENCOUNTER — Telehealth: Payer: Self-pay | Admitting: Obstetrics & Gynecology

## 2017-02-23 NOTE — Telephone Encounter (Signed)
Left patient a message to call back to reschedule a future appointment that was cancelled by the provider on 05/25/17 with Dr. Sabra Heck for her AEX.

## 2017-02-23 NOTE — Telephone Encounter (Signed)
New message ° °Pt verbalized that she is returning call for RN °

## 2017-02-23 NOTE — Telephone Encounter (Signed)
Informed pt of myoview results.  Pt verbalized understanding and was appreciative for call.

## 2017-02-27 ENCOUNTER — Other Ambulatory Visit: Payer: Self-pay | Admitting: Interventional Cardiology

## 2017-03-07 ENCOUNTER — Other Ambulatory Visit (HOSPITAL_COMMUNITY)
Admission: RE | Admit: 2017-03-07 | Discharge: 2017-03-07 | Disposition: A | Discharge status: Home / Self Care | Payer: 59 | Source: Ambulatory Visit | Attending: Obstetrics & Gynecology | Admitting: Obstetrics & Gynecology

## 2017-03-07 ENCOUNTER — Encounter: Payer: Self-pay | Admitting: Obstetrics & Gynecology

## 2017-03-07 ENCOUNTER — Other Ambulatory Visit: Payer: Self-pay

## 2017-03-07 ENCOUNTER — Ambulatory Visit: Payer: 59 | Admitting: Obstetrics & Gynecology

## 2017-03-07 VITALS — BP 112/60 | HR 80 | Resp 16 | Ht 63.5 in | Wt 168.0 lb

## 2017-03-07 DIAGNOSIS — Z01419 Encounter for gynecological examination (general) (routine) without abnormal findings: Secondary | ICD-10-CM | POA: Diagnosis not present

## 2017-03-07 DIAGNOSIS — Z124 Encounter for screening for malignant neoplasm of cervix: Secondary | ICD-10-CM | POA: Diagnosis not present

## 2017-03-07 MED ORDER — ESTRADIOL 0.1 MG/GM VA CREA: 1.0000 | TOPICAL_CREAM | Freq: Every day | VAGINAL | 3 refills | Status: DC | PRN

## 2017-03-07 NOTE — Progress Notes (Signed)
67 y.o. G2P2 MarriedCaucasianF here for annual exam. Doing well.  Went to [place] in December.   Had some questionable angina.  Had myocardial perfusion testing done 02/22/17.  Denies vaginal bleeding.    PCP:  Dr. Rex Kras.  Has appt end of March.  Will do blood work at that time.   Patient's last menstrual period was 02/06/2010.          Sexually active: Yes.    The current method of family planning is post menopausal status.  Exercising: Yes.    cardio Smoker:  no  Health Maintenance: Pap:  02/18/16 Neg. HR HPV:neg   09/19/13 neg  History of abnormal Pap:  no MMG:  03/07/16 BIRSDS2:Benign. Has appt 03/08/17 Colonoscopy:  03/26/15 Normal. F/u 10 years  BMD:   02/05/15 osteopenia. Has appt 03/08/17 TDaP:  05/2012 Pneumonia vaccine(s):  No Shingrix: Zostavax done Hep C testing: Done with PCP Screening Labs: PCP   reports that  has never smoked. she has never used smokeless tobacco. She reports that she does not drink alcohol or use drugs.  Past Medical History:  Diagnosis Date  . Angina, class IV ([place]) 01/2014  . Anxiety    "menopausal related", history of  . CAD S/P mLAD Bifurcation PCI: Resolute DES 2.25 mm x 18 mm(2.75 mm), 2.0 mm Angiosculpt of Diag  01/08/2014   [place] 0,1,1 midLAD-Diag lesion - progressed from Moderate to Severe 90% LAD, 70-80% Diag from 6-12 2015 --> Class IV Angina --> DES PCI with Angiosculpt PCI of Diag  FFR of m-dRCA 50-70% = 0.9   . Complication of anesthesia    extreme sore throat and hoarseness after aneathesia, also "woke up screamimg" after surgery once"  . Coronary artery disease involving native coronary artery with angina pectoris with documented spasm ([place]) 06/2013   h/o moderate 3 Vessel CAD with mLAD-Diag bifurcation ([place] 0,1,1)  . Diabetes mellitus without complication ([place])   . Essential hypertension    stress test 10 yrs ago.   pcp   dr Lennette Bihari little  . GERD (gastroesophageal reflux disease)   . Headache    Migraines childhood  . History  of migraine headaches    "as a child"  . Hyperlipemia   . Hyperthyroidism    "had radioactive iodine tx in the 1980's"  . Hypothyroidism   . Osteoarthritis    "joints ache"  . Osteopenia     Past Surgical History:  Procedure Laterality Date  . BACK SURGERY    . BREAST CYST ASPIRATION Left 04/1999   "done in dr's office"  . CARDIAC CATHETERIZATION  06/2013  . CARDIAC CATHETERIZATION  01/08/2014   Procedure: INTRAVASCULAR PRESSURE WIRE/FFR STUDY;  Surgeon: Sinclair Grooms, MD;  [facility] CATH LAB;  Service: Cardiovascular;;  distal RCA  . CARDIAC CATHETERIZATION  01/08/2014   Procedure: CORONARY BALLOON ANGIOPLASTY;  Surgeon: Sinclair Grooms, MD;  Location: [facility] CATH LAB;  Service: Cardiovascular;;  diag  . COLONOSCOPY    . CORONARY ANGIOPLASTY WITH STENT PLACEMENT  01/08/2014   "1"  . DILATATION & CURETTAGE/HYSTEROSCOPY WITH MYOSURE N/A 03/24/2016   Procedure: DILATATION & CURETTAGE/HYSTEROSCOPY WITH MYOSURE;  Surgeon: Megan Salon, MD;  Location: [facility];  Service: Gynecology;  Laterality: N/A;  . DILATION AND CURETTAGE OF UTERUS    . FRACTURE SURGERY    . LAPAROSCOPIC CHOLECYSTECTOMY  4/97  . LEFT HEART CATHETERIZATION WITH CORONARY ANGIOGRAM N/A 07/04/2013   Procedure: LEFT HEART CATHETERIZATION WITH CORONARY ANGIOGRAM;  Surgeon: Belva Crome  III, MD;  Location: [facility] CATH LAB;  Service: Cardiovascular;  Laterality: N/A;  . LEFT HEART CATHETERIZATION WITH CORONARY ANGIOGRAM N/A 01/08/2014   Procedure: LEFT HEART CATHETERIZATION WITH CORONARY ANGIOGRAM;  Surgeon: Sinclair Grooms, MD;  Location: [facility] CATH LAB;  Service: Cardiovascular;  Laterality: N/A;  . MOUTH SURGERY    . PERCUTANEOUS STENT INTERVENTION  01/08/2014   Procedure: PERCUTANEOUS STENT INTERVENTION;  Surgeon: Sinclair Grooms, MD;  Location: [facility] CATH LAB;  Service: Cardiovascular;;  MID LAD  . POSTERIOR CERVICAL FUSION/FORAMINOTOMY  04/04/2011   Procedure: POSTERIOR CERVICAL FUSION/FORAMINOTOMY LEVEL 1;  Surgeon: Charlie Pitter, MD;  Location: [facility];  Service: Neurosurgery;  Laterality: Left;  Left Cervical Six-Seven Laminectomy, Foraminotomy, Diskectomy   . TONSILLECTOMY    . WISDOM TOOTH EXTRACTION    . WRIST FRACTURE SURGERY Right 1990's    Current Outpatient Medications  Medication Sig Dispense Refill  . aspirin 81 MG chewable tablet Chew 1 tablet (81 mg total) by mouth daily.    [name] augmented betamethasone dipropionate (DIPROLENE-AF) 0.05 % cream Apply 1 application topically daily as needed (for skin irritation).     . Cholecalciferol (VITAMIN D3) 2000 units TABS Take 1 tablet by mouth daily.     . cyclobenzaprine (FLEXERIL) 10 MG tablet Take 10 mg by mouth 3 (three) times daily as needed for muscle spasms.   0  . estradiol (ESTRACE) 0.1 MG/GM vaginal cream 1 gram vaginally twice weekly (Patient taking differently: Place 1 Applicatorful vaginally daily as needed. 1 gram vaginally daily prn) 42.5 g 1  . ezetimibe (ZETIA) 10 MG tablet TAKE 1 TABLET (10 MG TOTAL) BY MOUTH DAILY. 30 tablet 11  . famotidine (PEPCID) 20 MG tablet Take 20 mg by mouth 2 (two) times daily as needed for heartburn or indigestion.    . fish oil-omega-3 fatty acids 1000 MG capsule Take 1 g by mouth daily.    . halobetasol (ULTRAVATE) 0.05 % cream Apply 1 application topically daily as needed (for skin irritation).     . hydrocortisone valerate cream (WESTCORT) 0.2 % Apply 1 application topically 2 (two) times daily as needed (rash).     [name] levothyroxine (SYNTHROID, LEVOTHROID) 88 MCG tablet Take 100 mcg by mouth daily before breakfast.    . losartan (COZAAR) 50 MG tablet Take 50 mg by mouth 2 (two) times daily.    . metoprolol tartrate (LOPRESSOR) 25 MG tablet TAKE 0.5 TABLETS (12.5 MG TOTAL) BY MOUTH DAILY. 15 tablet 11  . nitroGLYCERIN (NITROSTAT) 0.4 MG SL tablet Place 1 tablet (0.4 mg total) under the tongue every 5 (five) minutes as needed for chest pain. 25 tablet 3  . NONFORMULARY OR COMPOUNDED ITEM [facility]:  Onychomycosis nail lacquer - fluconazole 2%, Terbinafine 1%, DMSO apply to affected area daily. 120 each 11  . rosuvastatin (CRESTOR) 40 MG tablet Take 40 mg by mouth daily.     No current facility-administered medications for this visit.     Family History  Problem Relation Age of Onset  . Heart disease Father   . Hypertension Father   . Diabetes type II Father   . Heart disease Mother   . Hypertension Mother     ROS:  Pertinent items are noted in HPI.  Otherwise, a comprehensive ROS was negative.  Exam:   BP 112/60 (BP Location: Right Arm, Patient Position: Sitting, Cuff Size: Large)   Pulse 80   Resp 16   Ht 5' 3.5" (1.613 m)   Wt  168 lb (76.2 kg)   LMP 02/06/2010   BMI 29.29 kg/m      Height: 5' 3.5" (161.3 cm)  Ht Readings from Last 3 Encounters:  03/07/17 5' 3.5" (1.613 m)  02/22/17 5\' 3"  (1.6 m)  02/16/17 5\' 3"  (1.6 m)    General appearance: alert, cooperative and appears stated age Head: Normocephalic, without obvious abnormality, atraumatic Neck: no adenopathy, supple, symmetrical, trachea midline and thyroid normal to inspection and palpation Lungs: clear to auscultation bilaterally Breasts: normal appearance, no masses or tenderness Heart: regular rate and rhythm Abdomen: soft, non-tender; bowel sounds normal; no masses,  no organomegaly Extremities: extremities normal, atraumatic, no cyanosis or edema Skin: Skin color, texture, turgor normal. No rashes or lesions Lymph nodes: Cervical, supraclavicular, and axillary nodes normal. No abnormal inguinal nodes palpated Neurologic: Grossly normal   Pelvic: External genitalia:  no lesions              Urethra:  normal appearing urethra with no masses, tenderness or lesions              Bartholins and Skenes: normal                 Vagina: normal appearing vagina with normal color and discharge, no lesions              Cervix: no lesions              Pap taken: Yes.   Bimanual Exam:  Uterus:  normal size,  contour, position, consistency, mobility, non-tender              Adnexa: normal adnexa and no mass, fullness, tenderness               Rectovaginal: Confirms               Anus:  normal sphincter tone, no lesions  Chaperone was present for exam.  A:  Well Woman with normal exam PMP, no HRT Hypertension H/o sten in LAD bifurcation 01/08/14 with Dr. Tamala Julian Hyperlipidemia Vaginal atrophic changes/OAB symptoms Hypothyroidism  P:   Mammogram guidelines reviewed pap smear and HR HPV obtained today RF for Estarce cram 1 gm vaginally twice weekly prn Return annually or prn

## 2017-03-09 LAB — CYTOLOGY - PAP
Diagnosis: NEGATIVE
HPV: NOT DETECTED

## 2017-03-15 ENCOUNTER — Telehealth: Payer: Self-pay | Admitting: *Deleted

## 2017-03-15 NOTE — Telephone Encounter (Signed)
Patient returned call. BMD results reviewed with patient and she verbalized understanding.   Patient agreeable to disposition. Will close encounter.

## 2017-03-15 NOTE — Telephone Encounter (Signed)
Message left to return call to Emily at [phone] to review BMD results.  

## 2017-03-26 ENCOUNTER — Encounter: Payer: Self-pay | Admitting: Obstetrics & Gynecology

## 2017-05-25 ENCOUNTER — Ambulatory Visit: Payer: 59 | Admitting: Obstetrics & Gynecology

## 2017-07-04 ENCOUNTER — Telehealth: Payer: Self-pay | Admitting: Podiatry

## 2017-07-04 MED ORDER — NONFORMULARY OR COMPOUNDED ITEM: 11 refills | Status: DC

## 2017-07-04 NOTE — Telephone Encounter (Signed)
Left message informing pt I would refill the [facility] nail lacquer for 1 year, but if she found she was not completely improved in 1 year she would need to make an appt.

## 2017-07-04 NOTE — Telephone Encounter (Signed)
I'm calling back in regards to the message Marcy Siren, RN left me earlier today. I called [facility] back and they told me they do not have a refill prescription for me, that it had been denied. If you could call me back to let me know what is going on at [phone]. Thank you.

## 2017-07-04 NOTE — Addendum Note (Signed)
Addended by: Harriett Sine D on: 07/04/2017 11:12 AM   Modules accepted: Orders

## 2017-07-04 NOTE — Telephone Encounter (Signed)
I have requested a refill from [facility]. They faxed a request you a refill on my medication. It is for a nail polish for fungal toes. I wanted to check and see about getting the refill. I know its been over a year since I've seen Dr. Jacqualyn [name] so I don't know what your policy is on doing refills. I didn't know if I needed to be seen? The medication is helping some. You can reach me at [phone] and if I'm not here feel free to leave me a message. Thank you.

## 2017-07-06 ENCOUNTER — Other Ambulatory Visit: Payer: Self-pay | Admitting: *Deleted

## 2017-07-06 MED ORDER — METOPROLOL TARTRATE 25 MG PO TABS: 12.5000 mg | ORAL_TABLET | Freq: Every day | ORAL | 1 refills | Status: DC

## 2017-07-17 ENCOUNTER — Ambulatory Visit: Payer: 59 | Admitting: Podiatry

## 2017-11-15 ENCOUNTER — Other Ambulatory Visit: Payer: Self-pay | Admitting: Interventional Cardiology

## 2018-01-25 ENCOUNTER — Encounter: Payer: Self-pay | Admitting: Interventional Cardiology

## 2018-02-18 NOTE — Progress Notes (Signed)
Cardiology Office Note:    Date:  02/19/2018   ID:  KENYON ESHLEMAN, DOB 1950/10/28, MRN [id number]  PCP:  Hulan Fess, MD  Cardiologist:  Sinclair Grooms, MD   Referring MD: Hulan Fess, MD   Chief Complaint  Patient presents with  . Coronary Artery Disease    History of Present Illness:    Veronica [name] is a 68 y.o. female with a hx of with DM II, hyperlipidemia, hypertension, and mid to distal LAD drug-eluting stent placed 2015.  She has stable angina.  Intermittent episodes of chest tightness with or without activity.  Episodes are short-lived.  Nitroglycerin use is minimal if any.  No change over time.  Occasional muscle aches and pain which concerns her for statin effect.  She is doing reasonably well at this time.  Denies orthopnea, PND, palpitations, syncope and neurological complaints.  No specific medication side effects that she is able to identify other than muscle soreness which is tolerable.  Past Medical History:  Diagnosis Date  . Angina, class IV ([place]) 01/2014  . Anxiety    "menopausal related", history of  . CAD S/P mLAD Bifurcation PCI: Resolute DES 2.25 mm x 18 mm(2.75 mm), 2.0 mm Angiosculpt of Diag  01/08/2014   [place] 0,1,1 midLAD-Diag lesion - progressed from Moderate to Severe 90% LAD, 70-80% Diag from 6-12 2015 --> Class IV Angina --> DES PCI with Angiosculpt PCI of Diag  FFR of m-dRCA 50-70% = 0.9   . Complication of anesthesia    extreme sore throat and hoarseness after aneathesia, also "woke up screamimg" after surgery once"  . Coronary artery disease involving native coronary artery with angina pectoris with documented spasm ([place]) 06/2013   h/o moderate 3 Vessel CAD with mLAD-Diag bifurcation ([place] 0,1,1)  . Diabetes mellitus without complication ([place])   . Essential hypertension    stress test 10 yrs ago.   pcp   dr Lennette Bihari little  . GERD (gastroesophageal reflux disease)   . Headache    Migraines childhood  . History of migraine headaches      "as a child"  . Hyperlipemia   . Hyperthyroidism    "had radioactive iodine tx in the 1980's"  . Hypothyroidism   . Osteoarthritis    "joints ache"  . Osteopenia     Past Surgical History:  Procedure Laterality Date  . BACK SURGERY    . BREAST CYST ASPIRATION Left 04/1999   "done in dr's office"  . CARDIAC CATHETERIZATION  06/2013  . CARDIAC CATHETERIZATION  01/08/2014   Procedure: INTRAVASCULAR PRESSURE WIRE/FFR STUDY;  Surgeon: Sinclair Grooms, MD;  Location[facility] C[facility] LAB;  Service: Cardiovascular;;  distal RCA  . CARDIAC CATHETERIZATION  01/08/2014   Procedure: CORONARY BALLOON ANGIOPLASTY;  Surgeon: Sinclair Grooms, MD;  Location: [facility] CATH LAB;  Service: Cardiovascular;;  diag  . COLONOSCOPY    . CORONARY ANGIOPLASTY WITH STENT PLACEMENT  01/08/2014   "1"  . DILATATION & CURETTAGE/HYSTEROSCOPY WITH MYOSURE N/A 03/24/2016   Procedure: DILATATION & CURETTAGE/HYSTEROSCOPY WITH MYOSURE;  Surgeon: Megan Salon, MD;  Location: [place] [facility];  Service: Gynecology;  Laterality: N/A;  . DILATION AND CURETTAGE OF UTERUS    . FRACTURE SURGERY    . LAPAROSCOPIC CHOLECYSTECTOMY  4/97  . LEFT HEART CATHETERIZATION WITH CORONARY ANGIOGRAM N/A 07/04/2013   Procedure: LEFT HEART CATHETERIZATION WITH CORONARY ANGIOGRAM;  Surgeon: Sinclair Grooms, MD;  Location: [facility] CATH LAB;  Service: Cardiovascular;  Laterality: N/A;  .  LEFT HEART CATHETERIZATION WITH CORONARY ANGIOGRAM N/A 01/08/2014   Procedure: LEFT HEART CATHETERIZATION WITH CORONARY ANGIOGRAM;  Surgeon: Sinclair Grooms, MD;  Location: [facility] CATH LAB;  Service: Cardiovascular;  Laterality: N/A;  . MOUTH SURGERY    . PERCUTANEOUS STENT INTERVENTION  01/08/2014   Procedure: PERCUTANEOUS STENT INTERVENTION;  Surgeon: Sinclair Grooms, MD;  Location: [facility] CATH LAB;  Service: Cardiovascular;;  MID LAD  . POSTERIOR CERVICAL FUSION/FORAMINOTOMY  04/04/2011   Procedure: POSTERIOR CERVICAL FUSION/FORAMINOTOMY LEVEL 1;  Surgeon: Charlie Pitter, MD;  Location: [place]  NEURO ORS;  Service: Neurosurgery;  Laterality: Left;  Left Cervical Six-Seven Laminectomy, Foraminotomy, Diskectomy   . TONSILLECTOMY    . WISDOM TOOTH EXTRACTION    . WRIST FRACTURE SURGERY Right 1990's    Current Medications: Current Meds  Medication Sig  . aspirin 81 MG chewable tablet Chew 1 tablet (81 mg total) by mouth daily.  [name] augmented betamethasone dipropionate (DIPROLENE-AF) 0.05 % cream Apply 1 application topically daily as needed (for skin irritation).   . Cholecalciferol (VITAMIN D3) 2000 units TABS Take 1 tablet by mouth daily.   . cyclobenzaprine (FLEXERIL) 10 MG tablet Take 10 mg by mouth 3 (three) times daily as needed for muscle spasms.   [name] estradiol (ESTRACE) 0.1 MG/GM vaginal cream Place 1 Applicatorful vaginally daily as needed. 1 gram vaginally daily prn  . ezetimibe (ZETIA) 10 MG tablet TAKE 1 TABLET (10 MG TOTAL) BY MOUTH DAILY.  . famotidine (PEPCID) 20 MG tablet Take 20 mg by mouth 2 (two) times daily as needed for heartburn or indigestion.  . fish oil-omega-3 fatty acids 1000 MG capsule Take 1 g by mouth daily.  . halobetasol (ULTRAVATE) 0.05 % cream Apply 1 application topically daily as needed (for skin irritation).   . hydrocortisone valerate cream (WESTCORT) 0.2 % Apply 1 application topically 2 (two) times daily as needed (rash).   [name] levothyroxine (SYNTHROID, LEVOTHROID) 88 MCG tablet Take 100 mcg by mouth daily before breakfast.  . losartan (COZAAR) 50 MG tablet Take 50 mg by mouth 2 (two) times daily.  . metoprolol tartrate (LOPRESSOR) 25 MG tablet Take 0.5 tablets (12.5 mg total) by mouth daily. Please make yearly appt with Dr. Tamala Julian in January for future refills. 1st attempt  . nitroGLYCERIN (NITROSTAT) 0.4 MG SL tablet Place 1 tablet (0.4 mg total) under the tongue every 5 (five) minutes as needed for chest pain.  . NONFORMULARY OR COMPOUNDED ITEM [facility]: Onychomycosis nail lacquer - fluconazole 2%, Terbinafine 1%, DMSO apply to affected area  daily.  . rosuvastatin (CRESTOR) 40 MG tablet Take 40 mg by mouth daily.     Allergies:   Ace inhibitors; Prilosec [omeprazole magnesium]; Sulfa antibiotics; and Ultram [tramadol hcl]   Social History   Socioeconomic History  . Marital status: Married    Spouse name: Not on file  . Number of children: Not on file  . Years of education: Not on file  . Highest education level: Not on file  Occupational History  . Not on file  Social Needs  . Financial resource strain: Not on file  . Food insecurity:    Worry: Not on file    Inability: Not on file  . Transportation needs:    Medical: Not on file    Non-medical: Not on file  Tobacco Use  . Smoking status: Never Smoker  . Smokeless tobacco: Never Used  Substance and Sexual Activity  . Alcohol use: No  . Drug use: No  .  Sexual activity: Yes    Partners: Male    Birth control/protection: Post-menopausal  Lifestyle  . Physical activity:    Days per week: Not on file    Minutes per session: Not on file  . Stress: Not on file  Relationships  . Social connections:    Talks on phone: Not on file    Gets together: Not on file    Attends religious service: Not on file    Active member of club or organization: Not on file    Attends meetings of clubs or organizations: Not on file    Relationship status: Not on file  Other Topics Concern  . Not on file  Social History Narrative  . Not on file     Family History: The patient's family history includes Diabetes type II in her father; Heart disease in her father and mother; Hypertension in her father and mother.  ROS:   Please see the history of present illness.    Muscle pain as mentioned before.  No joint swelling.  All other systems reviewed and are negative.  EKGs/Labs/Other Studies Reviewed:    The following studies were reviewed today: No new functional or imaging data.  EKG:  EKG is performed today and demonstrates low voltage but otherwise normal tracing.  Compared  to the prior tracing from January 2019, low voltage is new.  Recent Labs: No results found for requested labs within last 8760 hours.  Recent Lipid Panel    Component Value Date/Time   CHOL 143 10/14/2014 0736   TRIG 152.0 (H) 10/14/2014 0736   HDL 51.00 10/14/2014 0736   CHOLHDL 3 10/14/2014 0736   VLDL 30.4 10/14/2014 0736   LDLCALC 62 10/14/2014 0736    Physical Exam:    VS:  BP 114/68   Pulse 78   Ht 5' 3.5" (1.613 m)   Wt 164 lb 9.6 oz (74.7 kg)   LMP 02/06/2010   SpO2 96%   BMI 28.70 kg/m     Wt Readings from Last 3 Encounters:  02/19/18 164 lb 9.6 oz (74.7 kg)  03/07/17 168 lb (76.2 kg)  02/22/17 169 lb (76.7 kg)     GEN: Moderate obesity. No acute distress HEENT: Normal NECK: No JVD. LYMPHATICS: No lymphadenopathy CARDIAC: RRR.  No murmur, gallop, edema VASCULAR: Pulses are 2+ in the radial and carotid, Bruits are absent in the carotid region. RESPIRATORY:  Clear to auscultation without rales, wheezing or rhonchi  ABDOMEN: Soft, non-tender, non-distended, No pulsatile mass, MUSCULOSKELETAL: No deformity  SKIN: Warm and dry NEUROLOGIC:  Alert and oriented x 3 PSYCHIATRIC:  Normal affect   ASSESSMENT:    1. CAD S/P mLAD Bifurcation PCI: Resolute DES 2.25 mm x 18 mm(2.75 mm), 2.0 mm Angiosculpt of Diag    2. Essential hypertension   3. Mixed hyperlipidemia    PLAN:    In order of problems listed above:  1. Stable chronic coronary disease with stable angina.  Aggressive secondary risk modification is intended and has been adhered to. 2. Target blood pressure 130/80 mmHg 3. Target LDL less than 70 and preferably less than 50.  Overall education and awareness concerning primary/secondary risk prevention was discussed in detail: LDL less than 70, hemoglobin A1c less than 7, blood pressure target less than 130/80 mmHg, >150 minutes of moderate aerobic activity per week, avoidance of smoking, weight control (via diet and exercise), and continued  surveillance/management of/for obstructive sleep apnea.  1 year follow-up   Medication Adjustments/Labs and Tests Ordered:  Current medicines are reviewed at length with the patient today.  Concerns regarding medicines are outlined above.  Orders Placed This Encounter  Procedures  . EKG 12-Lead   No orders of the defined types were placed in this encounter.   Patient Instructions  Medication Instructions:  Your physician recommends that you continue on your current medications as directed. Please refer to the Current Medication list given to you today.  If you need a refill on your cardiac medications before your next appointment, please call your pharmacy.   Lab work: None If you have labs (blood work) drawn today and your tests are completely normal, you will receive your results only by: [name] MyChart Message (if you have MyChart) OR . A paper copy in the mail If you have any lab test that is abnormal or we need to change your treatment, we will call you to review the results.  Testing/Procedures: None  Follow-Up: At [facility], you and your health needs are our priority.  As part of our continuing mission to provide you with exceptional heart care, we have created designated Provider Care Teams.  These Care Teams include your primary Cardiologist (physician) and Advanced Practice Providers (APPs -  Physician Assistants and Nurse Practitioners) who all work together to provide you with the care you need, when you need it. You will need a follow up appointment in 12 months.  Please call our office 2 months in advance to schedule this appointment.  You may see Sinclair Grooms, MD or one of the following Advanced Practice Providers on your designated Care Team:   Truitt Merle, NP Cecilie Kicks, NP . [name], NP  Any Other Special Instructions Will Be Listed Below (If Applicable).  Your provider recommends that you maintain 150 minutes per week of moderate aerobic  activity.       Signed, Sinclair Grooms, MD  02/19/2018 9:41 AM    [place]

## 2018-02-19 ENCOUNTER — Encounter: Payer: Self-pay | Admitting: Interventional Cardiology

## 2018-02-19 ENCOUNTER — Ambulatory Visit (INDEPENDENT_AMBULATORY_CARE_PROVIDER_SITE_OTHER): Payer: Medicare Other | Admitting: Interventional Cardiology

## 2018-02-19 ENCOUNTER — Encounter (INDEPENDENT_AMBULATORY_CARE_PROVIDER_SITE_OTHER): Payer: Self-pay

## 2018-02-19 VITALS — BP 114/68 | HR 78 | Ht 63.5 in | Wt 164.6 lb

## 2018-02-19 DIAGNOSIS — I10 Essential (primary) hypertension: Secondary | ICD-10-CM | POA: Diagnosis not present

## 2018-02-19 DIAGNOSIS — I2510 Atherosclerotic heart disease of native coronary artery without angina pectoris: Secondary | ICD-10-CM | POA: Diagnosis not present

## 2018-02-19 DIAGNOSIS — Z9861 Coronary angioplasty status: Secondary | ICD-10-CM | POA: Diagnosis not present

## 2018-02-19 DIAGNOSIS — E782 Mixed hyperlipidemia: Secondary | ICD-10-CM

## 2018-02-19 NOTE — Patient Instructions (Addendum)
Medication Instructions:  Your physician recommends that you continue on your current medications as directed. Please refer to the Current Medication list given to you today.  If you need a refill on your cardiac medications before your next appointment, please call your pharmacy.   Lab work: None If you have labs (blood work) drawn today and your tests are completely normal, you will receive your results only by: . MyChart Message (if you have MyChart) OR . A paper copy in the mail If you have any lab test that is abnormal or we need to change your treatment, we will call you to review the results.  Testing/Procedures: None  Follow-Up: At [facility], you and your health needs are our priority.  As part of our continuing mission to provide you with exceptional heart care, we have created designated Provider Care Teams.  These Care Teams include your primary Cardiologist (physician) and Advanced Practice Providers (APPs -  Physician Assistants and Nurse Practitioners) who all work together to provide you with the care you need, when you need it. You will need a follow up appointment in 12 months.  Please call our office 2 months in advance to schedule this appointment.  You may see Henry W Smith III, MD or one of the following Advanced Practice Providers on your designated Care Team:   Lori Gerhardt, NP Laura Ingold, NP . Jill McDaniel, NP  Any Other Special Instructions Will Be Listed Below (If Applicable).  Your provider recommends that you maintain 150 minutes per week of moderate aerobic activity.    

## 2018-03-02 ENCOUNTER — Other Ambulatory Visit: Payer: Self-pay | Admitting: Interventional Cardiology

## 2018-03-19 ENCOUNTER — Encounter: Payer: Self-pay | Admitting: Certified Nurse Midwife

## 2018-04-26 ENCOUNTER — Other Ambulatory Visit: Payer: Self-pay | Admitting: *Deleted

## 2018-04-26 MED ORDER — METOPROLOL TARTRATE 25 MG PO TABS: 12.5000 mg | ORAL_TABLET | Freq: Every day | ORAL | 2 refills | Status: DC

## 2018-05-21 ENCOUNTER — Ambulatory Visit: Payer: Self-pay | Admitting: Obstetrics & Gynecology

## 2018-09-02 ENCOUNTER — Other Ambulatory Visit: Payer: Self-pay | Admitting: Interventional Cardiology

## 2018-12-16 ENCOUNTER — Telehealth: Payer: Self-pay | Admitting: Obstetrics & Gynecology

## 2018-12-16 DIAGNOSIS — N950 Postmenopausal bleeding: Secondary | ICD-10-CM

## 2018-12-16 NOTE — Telephone Encounter (Signed)
Spoke with patient, advised per Dr. Sabra Heck. Advised to take Motrin 800 mg with food and water one hour before procedure. Order placed for precert.    Patient has anxiety about the discomfort from EMB, is familiar with procedure. States the last time she had a EMB she needed medication to help soften the cervix, this was many years ago. Patient is requesting this RX to pharmacy on file.   Dr. Sabra Heck -please advise on RX.   Cc: Lerry Liner, Magdalene Patricia

## 2018-12-16 NOTE — Telephone Encounter (Signed)
Spoke with patient. Patient is postmenopausal. Reports on 11/7  tissue was covered in blood when wiping, followed by brown d/c that day. Possibly pink tinge this morning. Urethra was irritated, applied vaginal estrogen cream, this has resolved. Denies pelvic pain, vaginal odor or urinary symptoms.   Hx of polyp. Last [facility] 03/09/16, followed by [facility] 03/24/16.   [id number] prescreen negative, precautions reviewed.   OV scheduled for 12/17/18 at 4pm with Dr. Sabra Heck. Advised patient Dr. Sabra Heck will review, I will return call if any additional recommendations. Patient agreeable.   Dr. Sabra Heck -ok to proceed with OV as scheduled or change to PUS?

## 2018-12-16 NOTE — Telephone Encounter (Signed)
She needs an endometrial biopsy and probably an ultrasound as well.  I can do the biopsy tomorrow and then plan the ultrasound after we get the biopsy results.  I'm ok to keep this as scheduled.  Thanks.

## 2018-12-16 NOTE — Telephone Encounter (Signed)
Patient had abnormal vaginal bleeding on Saturday. Last seen 05/25/17.

## 2018-12-17 ENCOUNTER — Other Ambulatory Visit: Payer: Self-pay

## 2018-12-17 ENCOUNTER — Ambulatory Visit (INDEPENDENT_AMBULATORY_CARE_PROVIDER_SITE_OTHER): Payer: Medicare Other | Admitting: Obstetrics & Gynecology

## 2018-12-17 ENCOUNTER — Encounter: Payer: Self-pay | Admitting: Obstetrics & Gynecology

## 2018-12-17 ENCOUNTER — Other Ambulatory Visit: Payer: Self-pay | Admitting: Obstetrics & Gynecology

## 2018-12-17 VITALS — BP 114/70 | HR 68 | Temp 97.1°F | Resp 16 | Wt 168.0 lb

## 2018-12-17 DIAGNOSIS — N950 Postmenopausal bleeding: Secondary | ICD-10-CM | POA: Diagnosis not present

## 2018-12-17 DIAGNOSIS — N390 Urinary tract infection, site not specified: Secondary | ICD-10-CM | POA: Diagnosis not present

## 2018-12-17 LAB — POCT URINALYSIS DIPSTICK
Bilirubin, UA: NEGATIVE
Blood, UA: NEGATIVE
Glucose, UA: NEGATIVE
Ketones, UA: NEGATIVE
Nitrite, UA: NEGATIVE
Protein, UA: POSITIVE — AB
Urobilinogen, UA: NEGATIVE E.U./dL — AB
pH, UA: 5 (ref 5.0–8.0)

## 2018-12-17 MED ORDER — NITROFURANTOIN MONOHYD MACRO 100 MG PO CAPS: 100.0000 mg | ORAL_CAPSULE | Freq: Two times a day (BID) | ORAL | 0 refills | Status: DC

## 2018-12-17 MED ORDER — MISOPROSTOL 100 MCG PO TABS: ORAL_TABLET | ORAL | 0 refills | Status: DC

## 2018-12-17 NOTE — Progress Notes (Signed)
GYNECOLOGY  VISIT  CC:   PMP bleeding  HPI: 68 y.o. G2P2 Married White or Caucasian female here for evaluation of PMP bleeding that started 12/14/2018 with bright red bleeding.  Bleeding then changed to a dark appearing discharge later in the day.  Also felt her urethra was irritated so applied some vaginal estrogen cream and thinks this helped.  She is having some increased urinary frequency and is wondering if she has a UTI.  She does not have pelvic pain, back pain or fever/chills.  She had an endometrial polyp removed 04/2016.  Last pap smear:  03/07/2017 negative with negative HR HPV.  GYNECOLOGIC HISTORY: Patient's last menstrual period was 02/06/2010. Contraception: PMP Menopausal hormone therapy: none  Patient Active Problem List   Diagnosis Date Noted  . Traumatic hematoma of right upper arm 01/15/2014  . CAD S/P mLAD Bifurcation PCI: Resolute DES 2.25 mm x 18 mm(2.75 mm), 2.0 mm Angiosculpt of Diag  01/08/2014    Class: Diagnosis of  . Essential hypertension 07/04/2013  . Hyperlipidemia 07/04/2013  . Hypothyroidism 07/04/2013  . Cervical radiculopathy 04/04/2011    Past Medical History:  Diagnosis Date  . Angina, class IV ([place]) 01/2014  . Anxiety    "menopausal related", history of  . CAD S/P mLAD Bifurcation PCI: Resolute DES 2.25 mm x 18 mm(2.75 mm), 2.0 mm Angiosculpt of Diag  01/08/2014   [place] 0,1,1 midLAD-Diag lesion - progressed from Moderate to Severe 90% LAD, 70-80% Diag from 6-12 2015 --> Class IV Angina --> DES PCI with Angiosculpt PCI of Diag  FFR of m-dRCA 50-70% = 0.9   . Complication of anesthesia    extreme sore throat and hoarseness after aneathesia, also "woke up screamimg" after surgery once"  . Coronary artery disease involving native coronary artery with angina pectoris with documented spasm ([place]) 06/2013   h/o moderate 3 Vessel CAD with mLAD-Diag bifurcation ([place] 0,1,1)  . Diabetes mellitus without complication ([place])   . Essential hypertension    stress test 10 yrs ago.   pcp   dr Lennette Bihari little  . GERD (gastroesophageal reflux disease)   . Headache    Migraines childhood  . History of migraine headaches    "as a child"  . Hyperlipemia   . Hyperthyroidism    "had radioactive iodine tx in the 1980's"  . Hypothyroidism   . Osteoarthritis    "joints ache"  . Osteopenia     Past Surgical History:  Procedure Laterality Date  . BACK SURGERY    . BREAST CYST ASPIRATION Left 04/1999   "done in dr's office"  . CARDIAC CATHETERIZATION  06/2013  . CARDIAC CATHETERIZATION  01/08/2014   Procedure: INTRAVASCULAR PRESSURE WIRE/FFR STUDY;  Surgeon: Sinclair Grooms, MD;  Location: [facility] CATH LAB;  Service: Cardiovascular;;  distal RCA  . CARDIAC CATHETERIZATION  01/08/2014   Procedure: CORONARY BALLOON ANGIOPLASTY;  Surgeon: Sinclair Grooms, MD;  Location: [facility] CATH LAB;  Service: Cardiovascular;;  diag  . COLONOSCOPY    . CORONARY ANGIOPLASTY WITH STENT PLACEMENT  01/08/2014   "1"  . DILATATION & CURETTAGE/HYSTEROSCOPY WITH MYOSURE N/A 03/24/2016   Procedure: DILATATION & CURETTAGE/HYSTEROSCOPY WITH MYOSURE;  Surgeon: Megan Salon, MD;  Location: [place] [facility];  Service: Gynecology;  Laterality: N/A;  . DILATION AND CURETTAGE OF UTERUS    . FRACTURE SURGERY    . LAPAROSCOPIC CHOLECYSTECTOMY  4/97  . LEFT HEART CATHETERIZATION WITH CORONARY ANGIOGRAM N/A 07/04/2013   Procedure: LEFT HEART CATHETERIZATION WITH CORONARY ANGIOGRAM;  Surgeon: Sinclair Grooms, MD;  Location: [facility] CATH LAB;  Service: Cardiovascular;  Laterality: N/A;  . LEFT HEART CATHETERIZATION WITH CORONARY ANGIOGRAM N/A 01/08/2014   Procedure: LEFT HEART CATHETERIZATION WITH CORONARY ANGIOGRAM;  Surgeon: Sinclair Grooms, MD;  Location: [facility] CATH LAB;  Service: Cardiovascular;  Laterality: N/A;  . MOUTH SURGERY    . PERCUTANEOUS STENT INTERVENTION  01/08/2014   Procedure: PERCUTANEOUS STENT INTERVENTION;  Surgeon: Sinclair Grooms, MD;  Location: [facility] CATH LAB;  Service: Cardiovascular;;   MID LAD  . POSTERIOR CERVICAL FUSION/FORAMINOTOMY  04/04/2011   Procedure: POSTERIOR CERVICAL FUSION/FORAMINOTOMY LEVEL 1;  Surgeon: Charlie Pitter, MD;  Location: [place] [facility]URO [facility];  Service: Neurosurgery;  Laterality: Left;  Left Cervical Six-Seven Laminectomy, Foraminotomy, Diskectomy   . TONSILLECTOMY    . WISDOM TOOTH EXTRACTION    . WRIST FRACTURE SURGERY Right 1990's    MEDS:   Current Outpatient Medications on File Prior to Visit  Medication Sig Dispense Refill  . aspirin 81 MG chewable tablet Chew 1 tablet (81 mg total) by mouth daily.    [name] augmented betamethasone dipropionate (DIPROLENE-AF) 0.05 % cream Apply 1 application topically daily as needed (for skin irritation).     . Cholecalciferol (VITAMIN D3) 2000 units TABS Take 1 tablet by mouth daily.     . cyclobenzaprine (FLEXERIL) 10 MG tablet Take 10 mg by mouth 3 (three) times daily as needed for muscle spasms.   0  . estradiol (ESTRACE) 0.1 MG/GM vaginal cream Place 1 Applicatorful vaginally daily as needed. 1 gram vaginally daily prn 42.5 g 3  . ezetimibe (ZETIA) 10 MG tablet TAKE 1 TABLET (10 MG TOTAL) BY MOUTH DAILY. 30 tablet 11  . famotidine (PEPCID) 20 MG tablet Take 20 mg by mouth 2 (two) times daily as needed for heartburn or indigestion.    . fish oil-omega-3 fatty acids 1000 MG capsule Take 1 g by mouth daily.    . halobetasol (ULTRAVATE) 0.05 % cream Apply 1 application topically daily as needed (for skin irritation).     . hydrocortisone valerate cream (WESTCORT) 0.2 % Apply 1 application topically 2 (two) times daily as needed (rash).     [name] levothyroxine (SYNTHROID, LEVOTHROID) 88 MCG tablet Take 100 mcg by mouth daily before breakfast.    . losartan (COZAAR) 50 MG tablet Take 50 mg by mouth 2 (two) times daily.    . metoprolol tartrate (LOPRESSOR) 25 MG tablet Take 0.5 tablets (12.5 mg total) by mouth daily. 45 tablet 2  . misoprostol (CYTOTEC) 100 MCG tablet Place one tablet vaginally now. 1 tablet 0  . NONFORMULARY OR  COMPOUNDED [place]: Onychomycosis nail lacquer - fluconazole 2%, Terbinafine 1%, DMSO apply to affected area daily. 120 each 11  . rosuvastatin (CRESTOR) 40 MG tablet Take 40 mg by mouth daily.    . nitroGLYCERIN (NITROSTAT) 0.4 MG SL tablet PLACE 1 TABLET (0.4 MG TOTAL) UNDER THE TONGUE EVERY 5 (FIVE) MINUTES AS NEEDED FOR CHEST PAIN. (Patient not taking: Reported on 12/17/2018) 25 tablet 3   No current facility-administered medications on file prior to visit.     ALLERGIES: Ace inhibitors, Prilosec [omeprazole magnesium], Sulfa antibiotics, and Ultram [tramadol hcl]  Family History  Problem Relation Age of Onset  . Heart disease Father   . Hypertension Father   . Diabetes type II Father   . Heart disease Mother   . Hypertension Mother     SH:  Married, non smoker  Review of Systems  Genitourinary: Positive for urgency and vaginal bleeding.  All other systems reviewed and are negative.   PHYSICAL EXAMINATION:    BP 114/70   Pulse 68   Temp (!) 97.1 F (36.2 C) (Skin)   Resp 16   Wt 168 lb (76.2 kg)   LMP 02/06/2010 Comment: bleeding 12-14-2018  BMI 29.29 kg/m     General appearance: alert, cooperative and appears stated age Abdomen: soft, non-tender; bowel sounds normal; no masses,  no organomegaly Lymph:  no inguinal LAD noted  Pelvic: External genitalia:  no lesions              Urethra:  normal appearing urethra with no masses, tenderness or lesions              Bartholins and Skenes: normal                 Vagina: normal appearing vagina with normal color and discharge, no lesions              Cervix: no lesions              Bimanual Exam:  Uterus:  normal size, contour, position, consistency, mobility, non-tender              Adnexa: no mass, fullness, tenderness  Endometrial biopsy recommended.  Discussed with patient.  Verbal and written consent obtained.   Procedure:  Speculum placed.  Cervix visualized and cleansed with betadine prep.  A single  toothed tenaculum was applied to the anterior lip of the cervix.  Endometrial pipelle was advanced through the cervix into the endometrial cavity without difficulty.  Pipelle passed to 7cm.  Suction applied and pipelle removed with scant tissue noted in first pass with pipelle.  Second pass performed with better tissue sample obtained.  Tenculum removed.  No bleeding noted.  Patient tolerated procedure well.  Chaperone was present for exam.  Assessment: PMP bleeding Urinary urgency that was proceeded by urethral irritation earlier over the weekend  Plan: Endometrial biopsy pending.  Results will be called to pt.  If negative, will proceed with PUS Urine culture obtained today. Will treat with macrobid 100mg  bid x 5 days.

## 2018-12-17 NOTE — Telephone Encounter (Signed)
Ok to send in rx for cytotec 154mcg pv pm and am before procedure.  Have her take 800mg  ibuprofen when uses medication as it is likely to make her cramp.

## 2018-12-17 NOTE — Telephone Encounter (Signed)
Call reviewed with Dr. Sabra Heck. Call returned to patient. Cytotec 100 mcg tablet, place one tab vaginally now, #1/0RF. Patient unable to take motrin, reviewed tylenol as alternative. Patient verbalizes understanding and is agreeable.    Encounter closed.

## 2018-12-18 LAB — URINE CULTURE

## 2018-12-20 ENCOUNTER — Telehealth: Payer: Self-pay | Admitting: *Deleted

## 2018-12-20 DIAGNOSIS — N950 Postmenopausal bleeding: Secondary | ICD-10-CM

## 2018-12-20 NOTE — Telephone Encounter (Addendum)
-----   Message from Megan Salon, MD sent at 12/19/2018 10:23 PM EST ----- Please let pt know her endometrial biopsy was negative.  I'd like her to have a PUS, possible SHGM, due to prior endometrial polyp removed in early 2018.   Notes recorded by Megan Salon, MD on 12/19/2018 at 10:58 PM EST  Please also let pt know her urine culture was negative. It is ok to complete the series of antibiotics as she should about 1/2 done with them anyway. Thanks.

## 2018-12-20 NOTE — Telephone Encounter (Signed)
Left message to call Davi Kroon, RN at [facility] [phone].   

## 2018-12-23 NOTE — Telephone Encounter (Signed)
Spoke with patient, advised as seen below per Dr. Sabra Heck. Patient agreeable to proceed with PUS, possible SHGM, order placed.   PUS, possible SHGM, scheduled for 12/26/18 at 1:30pm, consult at 2pm with Dr. Sabra Heck. Advised to take Motrin 800 mg with food and water one hour before procedure.   Routing to provider for final review. Patient is agreeable to disposition. Will close encounter.  Cc: Lerry Liner, Magdalene Patricia

## 2018-12-23 NOTE — Telephone Encounter (Signed)
Patient is returning call to Jill. °

## 2018-12-26 ENCOUNTER — Telehealth: Payer: Self-pay | Admitting: Obstetrics & Gynecology

## 2018-12-26 ENCOUNTER — Ambulatory Visit (INDEPENDENT_AMBULATORY_CARE_PROVIDER_SITE_OTHER): Payer: Medicare Other | Admitting: Obstetrics & Gynecology

## 2018-12-26 ENCOUNTER — Other Ambulatory Visit: Payer: Self-pay | Admitting: *Deleted

## 2018-12-26 ENCOUNTER — Ambulatory Visit (INDEPENDENT_AMBULATORY_CARE_PROVIDER_SITE_OTHER): Payer: Medicare Other

## 2018-12-26 ENCOUNTER — Other Ambulatory Visit: Payer: Self-pay

## 2018-12-26 ENCOUNTER — Encounter: Payer: Self-pay | Admitting: Obstetrics & Gynecology

## 2018-12-26 VITALS — BP 110/60 | HR 72 | Temp 97.4°F | Resp 12 | Wt 169.8 lb

## 2018-12-26 DIAGNOSIS — N950 Postmenopausal bleeding: Secondary | ICD-10-CM

## 2018-12-26 NOTE — Telephone Encounter (Signed)
Patient is calling to reschedule her PUS that was canceled today.

## 2018-12-26 NOTE — Telephone Encounter (Signed)
Spoke with patient. Patient will keep PUS as scheduled for today at 1:30pm. Will have telephone consult with Dr. Sabra Heck to discuss PUS results. Patient verbalizes understanding and is agreeable.   Encounter closed.

## 2018-12-28 NOTE — Progress Notes (Signed)
Virtual Visit via Telephone Note  I connected with Veronica [name] on 12/28/18 at  2:00 PM EST by telephone and verified that I am speaking with the correct person using two identifiers.  Location: Patient: home Provider: home  History of Present Illness: 29 G2P2 MWF who presented for PUS for additional evaluation due to PMP bleeding.  Endometrial biopsy was done 12/27/2018 and was negative.    Results reviewed.   Uterus:   6.2 x 3.3 x 2.9cm with 4mm fibroid Endometrium:  1.76mm Left ovary:  1.4 x 1.1 x 0.8cm Right ovary:  1.7 x 0.9 x 0.7cm Cul de sac:  No free fluid noted  Findings reviewed.  I do not think she needs any additional evaluation at this time.  She knows to call with any future bleeding.     Observations/Objective: n/a  Assessment and Plan: PMP bleeding with thin endometrium and negative endometrial biopsy  Follow Up Instructions: She is advised to call if has any future bleeding.  Voiced clear understanding and appreciated phone call.   Megan Salon, MD

## 2019-01-09 ENCOUNTER — Other Ambulatory Visit: Payer: Self-pay | Admitting: Interventional Cardiology

## 2019-01-21 ENCOUNTER — Telehealth: Payer: Self-pay | Admitting: *Deleted

## 2019-01-21 MED ORDER — NONFORMULARY OR COMPOUNDED ITEM: 11 refills | Status: AC

## 2019-01-21 NOTE — Telephone Encounter (Signed)
Pt states she needs a refill of the antifungal from [facility].

## 2019-01-21 NOTE — Telephone Encounter (Signed)
Left message informing pt we no longer used [facility], we used [facility] [phone] and they would call with delivery and insurance coverage information for their formula that is similar. Faxed orders to [facility].

## 2019-01-24 ENCOUNTER — Other Ambulatory Visit: Payer: Self-pay

## 2019-01-28 ENCOUNTER — Encounter: Payer: Self-pay | Admitting: Obstetrics & Gynecology

## 2019-01-28 ENCOUNTER — Other Ambulatory Visit: Payer: Self-pay

## 2019-01-28 ENCOUNTER — Ambulatory Visit (INDEPENDENT_AMBULATORY_CARE_PROVIDER_SITE_OTHER): Payer: Medicare Other | Admitting: Obstetrics & Gynecology

## 2019-01-28 VITALS — BP 118/72 | HR 68 | Temp 97.9°F | Resp 12 | Ht 63.5 in | Wt 170.0 lb

## 2019-01-28 DIAGNOSIS — Z01419 Encounter for gynecological examination (general) (routine) without abnormal findings: Secondary | ICD-10-CM

## 2019-01-28 MED ORDER — ESTRADIOL 0.1 MG/GM VA CREA: 1.0000 | TOPICAL_CREAM | Freq: Every day | VAGINAL | 3 refills | Status: DC | PRN

## 2019-01-28 NOTE — Progress Notes (Signed)
68 y.o. G2P2 Married White or Caucasian female here for annual exam.  Denies vaginal bleeding since prior visit.    Doing well.  PCP:  Dr. Rex Kras.  Had blood work done this year.   Cardiology:  Dr. Tamala Julian.  Has appt in February  Patient's last menstrual period was 02/06/2010.          Sexually active: Yes.    The current method of family planning is post menopausal status.    Exercising: Yes.    walking and bike Smoker:  no  Health Maintenance: Pap:  03/07/17 Neg:Neg HR HPV  02/18/16 Neg:Neg HR HPV  09/19/13 Neg History of abnormal Pap:  no MMG:  03/19/18 BIRADS 1 negative Colonoscopy:  03/26/15 f/u 10 years BMD:   03/08/17 Osteopenia.  Repeat 5 years.   TDaP:  2017 per patient  Pneumonia vaccine(s):  Has completed both vaccination Shingrix:   completed Hep C testing: negative with PCP Screening Labs: PCP   reports that she has never smoked. She has never used smokeless tobacco. She reports that she does not drink alcohol or use drugs.  Past Medical History:  Diagnosis Date  . Angina, class IV ([place]) 01/2014  . Anxiety    "menopausal related", history of  . CAD S/P mLAD Bifurcation PCI: Resolute DES 2.25 mm x 18 mm(2.75 mm), 2.0 mm Angiosculpt of Diag  01/08/2014   [place] 0,1,1 midLAD-Diag lesion - progressed from Moderate to Severe 90% LAD, 70-80% Diag from 6-12 2015 --> Class IV Angina --> DES PCI with Angiosculpt PCI of Diag  FFR of m-dRCA 50-70% = 0.9   . Complication of anesthesia    extreme sore throat and hoarseness after aneathesia, also "woke up screamimg" after surgery once"  . Coronary artery disease involving native coronary artery with angina pectoris with documented spasm ([place]) 06/2013   h/o moderate 3 Vessel CAD with mLAD-Diag bifurcation ([place] 0,1,1)  . Diabetes mellitus without complication ([place])   . Essential hypertension    stress test 10 yrs ago.   pcp   dr Lennette Bihari little  . GERD (gastroesophageal reflux disease)   . Headache    Migraines childhood  . History  of migraine headaches    "as a child"  . Hyperlipemia   . Hyperthyroidism    "had radioactive iodine tx in the 1980's"  . Hypothyroidism   . Osteoarthritis    "joints ache"  . Osteopenia     Past Surgical History:  Procedure Laterality Date  . BACK SURGERY    . BREAST CYST ASPIRATION Left 04/1999   "done in dr's office"  . CARDIAC CATHETERIZATION  06/2013  . CARDIAC CATHETERIZATION  01/08/2014   Procedure: INTRAVASCULAR PRESSURE WIRE/FFR STUDY;  Surgeon: Sinclair Grooms, MD;  Location: [facility] CATH LAB;  Service: Cardiovascular;;  distal RCA  . CARDIAC CATHETERIZATION  01/08/2014   Procedure: CORONARY BALLOON ANGIOPLASTY;  Surgeon: Sinclair Grooms, MD;  Location: [facility] CATH LAB;  Service: Cardiovascular;;  diag  . COLONOSCOPY    . CORONARY ANGIOPLASTY WITH STENT PLACEMENT  01/08/2014   "1"  . DILATATION & CURETTAGE/HYSTEROSCOPY WITH MYOSURE N/A 03/24/2016   Procedure: DILATATION & CURETTAGE/HYSTEROSCOPY WITH MYOSURE;  Surgeon: Megan Salon, MD;  Location: [facility];  Service: Gynecology;  Laterality: N/A;  . DILATION AND CURETTAGE OF UTERUS    . FRACTURE SURGERY    . LAPAROSCOPIC CHOLECYSTECTOMY  4/97  . LEFT HEART CATHETERIZATION WITH CORONARY ANGIOGRAM N/A 07/04/2013   Procedure: LEFT HEART CATHETERIZATION WITH CORONARY ANGIOGRAM;  Surgeon: Sinclair Grooms, MD;  Location: [facility] CATH LAB;  Service: Cardiovascular;  Laterality: N/A;  . LEFT HEART CATHETERIZATION WITH CORONARY ANGIOGRAM N/A 01/08/2014   Procedure: LEFT HEART CATHETERIZATION WITH CORONARY ANGIOGRAM;  Surgeon: Sinclair Grooms, MD;  Location: [facility] CATH LAB;  Service: Cardiovascular;  Laterality: N/A;  . MOUTH SURGERY    . PERCUTANEOUS STENT INTERVENTION  01/08/2014   Procedure: PERCUTANEOUS STENT INTERVENTION;  Surgeon: Sinclair Grooms, MD;  Location: [facility] CATH LAB;  Service: Cardiovascular;;  MID LAD  . POSTERIOR CERVICAL FUSION/FORAMINOTOMY  04/04/2011   Procedure: POSTERIOR CERVICAL FUSION/FORAMINOTOMY LEVEL 1;  Surgeon: Charlie Pitter, MD;  Location: [place] [facility];  Service: Neurosurgery;  Laterality: Left;  Left Cervical Six-Seven Laminectomy, Foraminotomy, Diskectomy   . TONSILLECTOMY    . WISDOM TOOTH EXTRACTION    . WRIST FRACTURE SURGERY Right 1990's    Current Outpatient Medications  Medication Sig Dispense Refill  . aspirin 81 MG chewable tablet Chew 1 tablet (81 mg total) by mouth daily.    [name] augmented betamethasone dipropionate (DIPROLENE-AF) 0.05 % cream Apply 1 application topically daily as needed (for skin irritation).     . Cholecalciferol (VITAMIN D3) 2000 units TABS Take 1 tablet by mouth daily.     . cyclobenzaprine (FLEXERIL) 10 MG tablet Take 10 mg by mouth 3 (three) times daily as needed for muscle spasms.   0  . estradiol (ESTRACE) 0.1 MG/GM vaginal cream Place 1 Applicatorful vaginally daily as needed. 1 gram vaginally daily prn 42.5 g 3  . ezetimibe (ZETIA) 10 MG tablet TAKE 1 TABLET BY MOUTH EVERY DAY 90 tablet 0  . famotidine (PEPCID) 20 MG tablet Take 20 mg by mouth 2 (two) times daily as needed for heartburn or indigestion.    . fish oil-omega-3 fatty acids 1000 MG capsule Take 1 g by mouth daily.    . halobetasol (ULTRAVATE) 0.05 % cream Apply 1 application topically daily as needed (for skin irritation).     . hydrocortisone valerate cream (WESTCORT) 0.2 % Apply 1 application topically 2 (two) times daily as needed (rash).     [name] levothyroxine (SYNTHROID, LEVOTHROID) 88 MCG tablet Take 100 mcg by mouth daily before breakfast.    . losartan (COZAAR) 50 MG tablet Take 50 mg by mouth 2 (two) times daily.    . metoprolol tartrate (LOPRESSOR) 25 MG tablet Take 0.5 tablets (12.5 mg total) by mouth daily. 45 tablet 2  . nitroGLYCERIN (NITROSTAT) 0.4 MG SL tablet PLACE 1 TABLET (0.4 MG TOTAL) UNDER THE TONGUE EVERY 5 (FIVE) MINUTES AS NEEDED FOR CHEST PAIN. 25 tablet 3  . NONFORMULARY OR COMPOUNDED ITEM [facility]:  Antifungal topical - Terbinafine 3%, Fluconazole 2%, Tea Tree Oil 5%, Urea  10%, Ibuprofen 2%, in #31ml DMSO suspension. Apply to affected toenail(s) once daily (at bedtime) or twice daily 30 each 11  . rosuvastatin (CRESTOR) 40 MG tablet Take 40 mg by mouth daily.     No current facility-administered medications for this visit.    Family History  Problem Relation Age of Onset  . Heart disease Father   . Hypertension Father   . Diabetes type II Father   . Heart disease Mother   . Hypertension Mother   . Hypertension Sister   . Heart Problems Sister   . Asthma Sister   . Hypertension Brother   . Hypertension Sister     Review of Systems  All other systems reviewed and are negative.  Exam:   BP 118/72 (BP Location: Right Arm, Patient Position: Sitting, Cuff Size: Normal)   Pulse 68   Temp 97.9 F (36.6 C) (Temporal)   Resp 12   Ht 5' 3.5" (1.613 m)   Wt 170 lb (77.1 kg)   LMP 02/06/2010 Comment: bleeding 12-14-2018  BMI 29.64 kg/m    Height: 5' 3.5" (161.3 cm)  Ht Readings from Last 3 Encounters:  01/28/19 5' 3.5" (1.613 m)  02/19/18 5' 3.5" (1.613 m)  03/07/17 5' 3.5" (1.613 m)    General appearance: alert, cooperative and appears stated age Head: Normocephalic, without obvious abnormality, atraumatic Neck: no adenopathy, supple, symmetrical, trachea midline and thyroid normal to inspection and palpation Lungs: clear to auscultation bilaterally Breasts: normal appearance, no masses or tenderness Heart: regular rate and rhythm Abdomen: soft, non-tender; bowel sounds normal; no masses,  no organomegaly Extremities: extremities normal, atraumatic, no cyanosis or edema Skin: Skin color, texture, turgor normal. No rashes or lesions Lymph nodes: Cervical, supraclavicular, and axillary nodes normal. No abnormal inguinal nodes palpated Neurologic: Grossly normal   Pelvic: External genitalia:  no lesions              Urethra:  normal appearing urethra with no masses, tenderness or lesions              Bartholins and Skenes: normal                  Vagina: normal appearing vagina with normal color and discharge, no lesions              Cervix: no lesions              Pap taken: No Bimanual Exam:  Uterus:  normal size, contour, position, consistency, mobility, non-tender              Adnexa: normal adnexa and no mass, fullness, tenderness               Rectovaginal: Confirms               Anus:  normal sphincter tone, no lesions  Chaperone, Terence Lux, CMA, was present for exam.  A:  Well Woman with normal exam PMP, no HRT Hypertension H/o stent in LAD bifurcation 01/08/14 with Dr. Tamala Julian Hyperlipidemia Hypothyroidism Vaginal atrophic change  P:   Mammogram guidelines reviewed. pap smear not indicated Colonoscopy UTD BMD repeat due 2024. RF for Estrace vaginal cream 1 gm pv once weekly  #42.5gram tube./3RF Vaccines UTD Has blood work planned in April return annually or prn

## 2019-03-06 ENCOUNTER — Ambulatory Visit: Payer: Self-pay

## 2019-03-15 ENCOUNTER — Ambulatory Visit: Payer: Medicare Other

## 2019-03-17 ENCOUNTER — Ambulatory Visit: Payer: Self-pay

## 2019-04-02 NOTE — Progress Notes (Signed)
Cardiology Office Note:    Date:  04/03/2019   ID:  Veronica [name], DOB 31-Jul-1950, MRN [id number]  PCP:  Hulan Fess, MD  Cardiologist:  Sinclair Grooms, MD   Referring MD: Hulan Fess, MD   Chief Complaint  Patient presents with  . Coronary Artery Disease    History of Present Illness:    Veronica [name] is a 69 y.o. female with a hx of DM II, hyperlipidemia, hypertension, and mid to distal LAD drug-eluting stent placed2015.  She feels stable.  No more angina than usual.  Rare sublingual nitroglycerin use.  Taking her medications without difficulty.  No medication side effects.  No neurological complaints.  She does not have claudication.  Past Medical History:  Diagnosis Date  . Angina, class IV ([place]) 01/2014  . Anxiety    "menopausal related", history of  . CAD S/P mLAD Bifurcation PCI: Resolute DES 2.25 mm x 18 mm(2.75 mm), 2.0 mm Angiosculpt of Diag  01/08/2014   [place] 0,1,1 midLAD-Diag lesion - progressed from Moderate to Severe 90% LAD, 70-80% Diag from 6-12 2015 --> Class IV Angina --> DES PCI with Angiosculpt PCI of Diag  FFR of m-dRCA 50-70% = 0.9   . Complication of anesthesia    extreme sore throat and hoarseness after aneathesia, also "woke up screamimg" after surgery once"  . Coronary artery disease involving native coronary artery with angina pectoris with documented spasm ([place]) 06/2013   h/o moderate 3 Vessel CAD with mLAD-Diag bifurcation ([place] 0,1,1)  . Diabetes mellitus without complication ([place])   . Essential hypertension    stress test 10 yrs ago.   pcp   dr Lennette Bihari little  . GERD (gastroesophageal reflux disease)   . Headache    Migraines childhood  . History of migraine headaches    "as a child"  . Hyperlipemia   . Hyperthyroidism    "had radioactive iodine tx in the 1980's"  . Hypothyroidism   . Osteoarthritis    "joints ache"  . Osteopenia     Past Surgical History:  Procedure Laterality Date  . BACK SURGERY    . BREAST CYST ASPIRATION  Left 04/1999   "done in dr's office"  . CARDIAC CATHETERIZATION  06/2013  . CARDIAC CATHETERIZATION  01/08/2014   Procedure: INTRAVASCULAR PRESSURE WIRE/FFR STUDY;  Surgeon: Sinclair Grooms, MD;  Location: [facility] CATH LAB;  Service: Cardiovascular;;  distal RCA  . CARDIAC CATHETERIZATION  01/08/2014   Procedure: CORONARY BALLOON ANGIOPLASTY;  Surgeon: Sinclair Grooms, MD;  Location: [facility] CATH LAB;  Service: Cardiovascular;;  diag  . COLONOSCOPY    . CORONARY ANGIOPLASTY WITH STENT PLACEMENT  01/08/2014   "1"  . DILATATION & CURETTAGE/HYSTEROSCOPY WITH MYOSURE N/A 03/24/2016   Procedure: DILATATION & CURETTAGE/HYSTEROSCOPY WITH MYOSURE;  Surgeon: Megan Salon, MD;  Location: [place] [facility];  Service: Gynecology;  Laterality: N/A;  . DILATION AND CURETTAGE OF UTERUS    . FRACTURE SURGERY    . LAPAROSCOPIC CHOLECYSTECTOMY  4/97  . LEFT HEART CATHETERIZATION WITH CORONARY ANGIOGRAM N/A 07/04/2013   Procedure: LEFT HEART CATHETERIZATION WITH CORONARY ANGIOGRAM;  Surgeon: Sinclair Grooms, MD;  Location: [facility] CATH LAB;  Service: Cardiovascular;  Laterality: N/A;  . LEFT HEART CATHETERIZATION WITH CORONARY ANGIOGRAM N/A 01/08/2014   Procedure: LEFT HEART CATHETERIZATION WITH CORONARY ANGIOGRAM;  Surgeon: Sinclair Grooms, MD;  Location: [facility] CATH LAB;  Service: Cardiovascular;  Laterality: N/A;  . MOUTH SURGERY    . PERCUTANEOUS STENT INTERVENTION  01/08/2014   Procedure: PERCUTANEOUS STENT INTERVENTION;  Surgeon: Sinclair Grooms, MD;  Location: [facility] CATH LAB;  Service: Cardiovascular;;  MID LAD  . POSTERIOR CERVICAL FUSION/FORAMINOTOMY  04/04/2011   Procedure: POSTERIOR CERVICAL FUSION/FORAMINOTOMY LEVEL 1;  Surgeon: Charlie Pitter, MD;  Location: [place] [facility];  Service: Neurosurgery;  Laterality: Left;  Left Cervical Six-Seven Laminectomy, Foraminotomy, Diskectomy   . TONSILLECTOMY    . WISDOM TOOTH EXTRACTION    . WRIST FRACTURE SURGERY Right 1990's    Current Medications: Current Meds  Medication Sig  .  aspirin 81 MG chewable tablet Chew 1 tablet (81 mg total) by mouth daily.  [name] augmented betamethasone dipropionate (DIPROLENE-AF) 0.05 % cream Apply 1 application topically daily as needed (for skin irritation).   . Cholecalciferol (VITAMIN D3) 2000 units TABS Take 1 tablet by mouth daily.   . cyclobenzaprine (FLEXERIL) 10 MG tablet Take 10 mg by mouth 3 (three) times daily as needed for muscle spasms.   [name] estradiol (ESTRACE) 0.1 MG/GM vaginal cream Place 1 Applicatorful vaginally daily as needed. 1 gram vaginally daily prn  . ezetimibe (ZETIA) 10 MG tablet TAKE 1 TABLET BY MOUTH EVERY DAY  . famotidine (PEPCID) 20 MG tablet Take 20 mg by mouth 2 (two) times daily as needed for heartburn or indigestion.  . fish oil-omega-3 fatty acids 1000 MG capsule Take 1 g by mouth daily.  . halobetasol (ULTRAVATE) 0.05 % cream Apply 1 application topically daily as needed (for skin irritation).   . hydrocortisone valerate cream (WESTCORT) 0.2 % Apply 1 application topically 2 (two) times daily as needed (rash).   [name] levothyroxine (SYNTHROID, LEVOTHROID) 88 MCG tablet Take 100 mcg by mouth daily before breakfast.  . losartan (COZAAR) 100 MG tablet Take 100 mg by mouth daily.   . metoprolol tartrate (LOPRESSOR) 25 MG tablet Take 0.5 tablets (12.5 mg total) by mouth daily.  . nitroGLYCERIN (NITROSTAT) 0.4 MG SL tablet PLACE 1 TABLET (0.4 MG TOTAL) UNDER THE TONGUE EVERY 5 (FIVE) MINUTES AS NEEDED FOR CHEST PAIN.  . NONFORMULARY OR COMPOUNDED ITEM [facility]:  Antifungal topical - Terbinafine 3%, Fluconazole 2%, Tea Tree Oil 5%, Urea 10%, Ibuprofen 2%, in #29ml DMSO suspension. Apply to affected toenail(s) once daily (at bedtime) or twice daily  . rosuvastatin (CRESTOR) 40 MG tablet Take 40 mg by mouth daily.     Allergies:   Ace inhibitors, Prilosec [omeprazole magnesium], Sulfa antibiotics, and Ultram [tramadol hcl]   Social History   Socioeconomic History  . Marital status: Married    Spouse name:  Not on file  . Number of children: Not on file  . Years of education: Not on file  . Highest education level: Not on file  Occupational History  . Not on file  Tobacco Use  . Smoking status: Never Smoker  . Smokeless tobacco: Never Used  Substance and Sexual Activity  . Alcohol use: No  . Drug use: No  . Sexual activity: Yes    Partners: Male    Birth control/protection: Post-menopausal  Other Topics Concern  . Not on file  Social History Narrative  . Not on file   Social Determinants of Health   Financial Resource Strain:   . Difficulty of Paying Living Expenses: Not on file  Food Insecurity:   . Worried About Charity fundraiser in the Last Year: Not on file  . Ran Out of Food in the Last Year: Not on file  Transportation Needs:   . Lack of  Transportation (Medical): Not on file  . Lack of Transportation (Non-Medical): Not on file  Physical Activity:   . Days of Exercise per Week: Not on file  . Minutes of Exercise per Session: Not on file  Stress:   . Feeling of Stress : Not on file  Social Connections:   . Frequency of Communication with Friends and Family: Not on file  . Frequency of Social Gatherings with Friends and Family: Not on file  . Attends Religious Services: Not on file  . Active Member of Clubs or Organizations: Not on file  . Attends Archivist Meetings: Not on file  . Marital Status: Not on file     Family History: The patient's family history includes Asthma in her sister; Diabetes type II in her father; Heart Problems in her sister; Heart disease in her father and mother; Hypertension in her brother, father, mother, sister, and sister.  ROS:   Please see the history of present illness.    Having some musculoskeletal complaints including knee discomfort but otherwise unremarkable.  All other systems reviewed and are negative.  EKGs/Labs/Other Studies Reviewed:    The following studies were reviewed today: No new imaging data  EKG:   EKG normal sinus rhythm, poor R wave progression, low voltage.  No acute changes noted.  Recent Labs: No results found for requested labs within last 8760 hours.  Recent Lipid Panel    Component Value Date/Time   CHOL 143 10/14/2014 0736   TRIG 152.0 (H) 10/14/2014 0736   HDL 51.00 10/14/2014 0736   CHOLHDL 3 10/14/2014 0736   VLDL 30.4 10/14/2014 0736   LDLCALC 62 10/14/2014 0736    Physical Exam:    VS:  BP 132/72   Pulse 74   Ht 5' 3.5" (1.613 m)   Wt 170 lb 12.8 oz (77.5 kg)   LMP 02/06/2010 Comment: bleeding 12-14-2018  SpO2 99%   BMI 29.78 kg/m     Wt Readings from Last 3 Encounters:  04/03/19 170 lb 12.8 oz (77.5 kg)  01/28/19 170 lb (77.1 kg)  12/26/18 169 lb 12.8 oz (77 kg)     GEN: Healthy-appearing. No acute distress HEENT: Normal NECK: No JVD. LYMPHATICS: No lymphadenopathy CARDIAC:  RRR without murmur, gallop, or edema. VASCULAR:  Normal Pulses. No bruits. RESPIRATORY:  Clear to auscultation without rales, wheezing or rhonchi  ABDOMEN: Soft, non-tender, non-distended, No pulsatile mass, MUSCULOSKELETAL: No deformity  SKIN: Warm and dry NEUROLOGIC:  Alert and oriented x 3 PSYCHIATRIC:  Normal affect   ASSESSMENT:    1. CAD S/P mLAD Bifurcation PCI: Resolute DES 2.25 mm x 18 mm(2.75 mm), 2.0 mm Angiosculpt of Diag    2. Essential hypertension   3. Mixed hyperlipidemia   4. Educated about COVID-19 virus infection    PLAN:    In order of problems listed above:  1. Secondary prevention discussed. 2. Target blood pressure 130/80 mmHg.  Low-salt diet and weight control discussed. 3. LDL target less than 70. 4. COVID-19 vaccine initial dose has been given.  Second dose will be done in 2 weeks.  Social distancing and mask wearing is being followed.  Overall education and awareness concerning primary/secondary risk prevention was discussed in detail: LDL less than 70, hemoglobin A1c less than 7, blood pressure target less than 130/80 mmHg, >150 minutes  of moderate aerobic activity per week, avoidance of smoking, weight control (via diet and exercise), and continued surveillance/management of/for obstructive sleep apnea.     Medication Adjustments/Labs and  Tests Ordered: Current medicines are reviewed at length with the patient today.  Concerns regarding medicines are outlined above.  Orders Placed This Encounter  Procedures  . EKG 12-Lead   No orders of the defined types were placed in this encounter.   Patient Instructions  Medication Instructions:  Your physician recommends that you continue on your current medications as directed. Please refer to the Current Medication list given to you today.  *If you need a refill on your cardiac medications before your next appointment, please call your pharmacy*  Lab Work: None If you have labs (blood work) drawn today and your tests are completely normal, you will receive your results only by: [name] MyChart Message (if you have MyChart) OR . A paper copy in the mail If you have any lab test that is abnormal or we need to change your treatment, we will call you to review the results.  Testing/Procedures: None  Follow-Up: At [facility], you and your health needs are our priority.  As part of our continuing mission to provide you with exceptional heart care, we have created designated Provider Care Teams.  These Care Teams include your primary Cardiologist (physician) and Advanced Practice Providers (APPs -  Physician Assistants and Nurse Practitioners) who all work together to provide you with the care you need, when you need it.  Your next appointment:   9-12 month(s)  The format for your next appointment:   In Person  Provider:   You may see Sinclair Grooms, MD or one of the following Advanced Practice Providers on your designated Care Team:    Truitt Merle, NP  Cecilie Kicks, NP  [name], NP   Other Instructions      Signed, Sinclair Grooms, MD  04/03/2019  1:27 PM    [place]

## 2019-04-03 ENCOUNTER — Encounter: Payer: Self-pay | Admitting: Interventional Cardiology

## 2019-04-03 ENCOUNTER — Other Ambulatory Visit: Payer: Self-pay | Admitting: Interventional Cardiology

## 2019-04-03 ENCOUNTER — Other Ambulatory Visit: Payer: Self-pay

## 2019-04-03 ENCOUNTER — Ambulatory Visit (INDEPENDENT_AMBULATORY_CARE_PROVIDER_SITE_OTHER): Payer: Medicare Other | Admitting: Interventional Cardiology

## 2019-04-03 VITALS — BP 132/72 | HR 74 | Ht 63.5 in | Wt 170.8 lb

## 2019-04-03 DIAGNOSIS — I10 Essential (primary) hypertension: Secondary | ICD-10-CM

## 2019-04-03 DIAGNOSIS — Z7189 Other specified counseling: Secondary | ICD-10-CM

## 2019-04-03 DIAGNOSIS — Z9861 Coronary angioplasty status: Secondary | ICD-10-CM | POA: Diagnosis not present

## 2019-04-03 DIAGNOSIS — I2510 Atherosclerotic heart disease of native coronary artery without angina pectoris: Secondary | ICD-10-CM

## 2019-04-03 DIAGNOSIS — E782 Mixed hyperlipidemia: Secondary | ICD-10-CM | POA: Diagnosis not present

## 2019-04-03 NOTE — Patient Instructions (Signed)
Medication Instructions:  Your physician recommends that you continue on your current medications as directed. Please refer to the Current Medication list given to you today.  *If you need a refill on your cardiac medications before your next appointment, please call your pharmacy*  Lab Work: None If you have labs (blood work) drawn today and your tests are completely normal, you will receive your results only by: . MyChart Message (if you have MyChart) OR . A paper copy in the mail If you have any lab test that is abnormal or we need to change your treatment, we will call you to review the results.  Testing/Procedures: None  Follow-Up: At [facility], you and your health needs are our priority.  As part of our continuing mission to provide you with exceptional heart care, we have created designated Provider Care Teams.  These Care Teams include your primary Cardiologist (physician) and Advanced Practice Providers (APPs -  Physician Assistants and Nurse Practitioners) who all work together to provide you with the care you need, when you need it.  Your next appointment:   9-12 month(s)  The format for your next appointment:   In Person  Provider:   You may see Henry W Smith III, MD or one of the following Advanced Practice Providers on your designated Care Team:    Lori Gerhardt, NP  Laura Ingold, NP  Jill McDaniel, NP   Other Instructions   

## 2019-04-04 ENCOUNTER — Other Ambulatory Visit: Payer: Self-pay | Admitting: Interventional Cardiology

## 2019-04-29 ENCOUNTER — Encounter: Payer: Self-pay | Admitting: Certified Nurse Midwife

## 2019-06-30 ENCOUNTER — Other Ambulatory Visit: Payer: Self-pay | Admitting: Interventional Cardiology

## 2019-09-09 ENCOUNTER — Encounter: Payer: Self-pay | Admitting: Obstetrics & Gynecology

## 2019-11-18 ENCOUNTER — Observation Stay (HOSPITAL_COMMUNITY)
Admission: EM | Admit: 2019-11-18 | Discharge: 2019-11-19 | Disposition: A | Discharge status: Home / Self Care | Payer: Medicare Other | Attending: Cardiovascular Disease | Admitting: Cardiovascular Disease

## 2019-11-18 ENCOUNTER — Observation Stay (HOSPITAL_BASED_OUTPATIENT_CLINIC_OR_DEPARTMENT_OTHER): Payer: Medicare Other

## 2019-11-18 ENCOUNTER — Encounter (HOSPITAL_COMMUNITY): Payer: Self-pay | Admitting: Pharmacy Technician

## 2019-11-18 ENCOUNTER — Emergency Department (HOSPITAL_COMMUNITY): Payer: Medicare Other

## 2019-11-18 DIAGNOSIS — E7800 Pure hypercholesterolemia, unspecified: Secondary | ICD-10-CM | POA: Diagnosis not present

## 2019-11-18 DIAGNOSIS — Z7982 Long term (current) use of aspirin: Secondary | ICD-10-CM | POA: Diagnosis not present

## 2019-11-18 DIAGNOSIS — E119 Type 2 diabetes mellitus without complications: Secondary | ICD-10-CM | POA: Diagnosis not present

## 2019-11-18 DIAGNOSIS — E039 Hypothyroidism, unspecified: Secondary | ICD-10-CM | POA: Diagnosis not present

## 2019-11-18 DIAGNOSIS — I10 Essential (primary) hypertension: Secondary | ICD-10-CM | POA: Diagnosis not present

## 2019-11-18 DIAGNOSIS — Z79899 Other long term (current) drug therapy: Secondary | ICD-10-CM | POA: Insufficient documentation

## 2019-11-18 DIAGNOSIS — Z7984 Long term (current) use of oral hypoglycemic drugs: Secondary | ICD-10-CM | POA: Diagnosis not present

## 2019-11-18 DIAGNOSIS — Z9861 Coronary angioplasty status: Secondary | ICD-10-CM

## 2019-11-18 DIAGNOSIS — E785 Hyperlipidemia, unspecified: Secondary | ICD-10-CM | POA: Diagnosis present

## 2019-11-18 DIAGNOSIS — I25110 Atherosclerotic heart disease of native coronary artery with unstable angina pectoris: Principal | ICD-10-CM | POA: Insufficient documentation

## 2019-11-18 DIAGNOSIS — I200 Unstable angina: Secondary | ICD-10-CM

## 2019-11-18 DIAGNOSIS — R079 Chest pain, unspecified: Secondary | ICD-10-CM

## 2019-11-18 DIAGNOSIS — Z20822 Contact with and (suspected) exposure to covid-19: Secondary | ICD-10-CM | POA: Diagnosis not present

## 2019-11-18 DIAGNOSIS — Z955 Presence of coronary angioplasty implant and graft: Secondary | ICD-10-CM | POA: Diagnosis not present

## 2019-11-18 DIAGNOSIS — I119 Hypertensive heart disease without heart failure: Secondary | ICD-10-CM | POA: Diagnosis not present

## 2019-11-18 DIAGNOSIS — I2510 Atherosclerotic heart disease of native coronary artery without angina pectoris: Secondary | ICD-10-CM

## 2019-11-18 LAB — RESPIRATORY PANEL BY RT PCR (FLU A&B, COVID)
Influenza A by PCR: NEGATIVE
Influenza B by PCR: NEGATIVE
SARS Coronavirus 2 by RT PCR: NEGATIVE

## 2019-11-18 LAB — CBC
HCT: 45 % (ref 36.0–46.0)
Hemoglobin: 14.6 g/dL (ref 12.0–15.0)
MCH: 30.6 pg (ref 26.0–34.0)
MCHC: 32.4 g/dL (ref 30.0–36.0)
MCV: 94.3 fL (ref 80.0–100.0)
Platelets: 182 10*3/uL (ref 150–400)
RBC: 4.77 MIL/uL (ref 3.87–5.11)
RDW: 13 % (ref 11.5–15.5)
WBC: 6.3 10*3/uL (ref 4.0–10.5)
nRBC: 0 % (ref 0.0–0.2)

## 2019-11-18 LAB — BASIC METABOLIC PANEL
Anion gap: 10 (ref 5–15)
BUN: 16 mg/dL (ref 8–23)
CO2: 25 mmol/L (ref 22–32)
Calcium: 9.8 mg/dL (ref 8.9–10.3)
Chloride: 107 mmol/L (ref 98–111)
Creatinine, Ser: 0.82 mg/dL (ref 0.44–1.00)
GFR, Estimated: >60 mL/min (ref >60)
Glucose, Bld: 134 mg/dL — ABNORMAL HIGH (ref 70–99)
Potassium: 4 mmol/L (ref 3.5–5.1)
Sodium: 142 mmol/L (ref 135–145)

## 2019-11-18 LAB — ECHOCARDIOGRAM COMPLETE
AR max vel: 1.98 cm2
AV Area VTI: 2.21 cm2
AV Area mean vel: 1.99 cm2
AV Mean grad: 3 mmHg
AV Peak grad: 5.4 mmHg
Ao pk vel: 1.16 m/s
Area-P 1/2: 3.53 cm2
Height: 64 in
S' Lateral: 2.3 cm
Weight: 2720 oz

## 2019-11-18 LAB — PROTIME-INR
INR: 1 (ref 0.8–1.2)
Prothrombin Time: 12.3 seconds (ref 11.4–15.2)

## 2019-11-18 LAB — MAGNESIUM: Magnesium: 2.2 mg/dL (ref 1.7–2.4)

## 2019-11-18 LAB — TROPONIN I (HIGH SENSITIVITY)
Troponin I (High Sensitivity): 4 ng/L (ref <18)
Troponin I (High Sensitivity): 6 ng/L (ref <18)

## 2019-11-18 LAB — HEMOGLOBIN A1C
Hgb A1c MFr Bld: 6 % — ABNORMAL HIGH (ref 4.8–5.6)
Mean Plasma Glucose: 125.5 mg/dL

## 2019-11-18 LAB — HEPARIN LEVEL (UNFRACTIONATED): Heparin Unfractionated: 0.99 IU/mL — ABNORMAL HIGH (ref 0.30–0.70)

## 2019-11-18 LAB — HIV ANTIBODY (ROUTINE TESTING W REFLEX): HIV Screen 4th Generation wRfx: NONREACTIVE

## 2019-11-18 LAB — TSH: TSH: 6.387 u[IU]/mL — ABNORMAL HIGH (ref 0.350–4.500)

## 2019-11-18 MED ORDER — ALPRAZOLAM 0.25 MG PO TABS: 0.2500 mg | ORAL_TABLET | Freq: Two times a day (BID) | ORAL | Status: DC | PRN

## 2019-11-18 MED ORDER — LEVOTHYROXINE SODIUM 100 MCG PO TABS
100.0000 ug | ORAL_TABLET | Freq: Every day | ORAL | Status: DC
  Administered 2019-11-19: 100 ug via ORAL
  Filled 2019-11-18: qty 1

## 2019-11-18 MED ORDER — CYCLOBENZAPRINE HCL 10 MG PO TABS: 10.0000 mg | ORAL_TABLET | Freq: Three times a day (TID) | ORAL | Status: DC | PRN

## 2019-11-18 MED ORDER — HEPARIN (PORCINE) 25000 UT/250ML-% IV SOLN
750.0000 [IU]/h | INTRAVENOUS | Status: DC
  Administered 2019-11-18: 900 [IU]/h via INTRAVENOUS
  Filled 2019-11-18 (×2): qty 250

## 2019-11-18 MED ORDER — SODIUM CHLORIDE 0.9% FLUSH: 3.0000 mL | Freq: Two times a day (BID) | INTRAVENOUS | Status: DC

## 2019-11-18 MED ORDER — ONDANSETRON HCL 4 MG/2ML IJ SOLN
4.0000 mg | Freq: Four times a day (QID) | INTRAMUSCULAR | Status: DC | PRN
  Administered 2019-11-18 – 2019-11-19 (×2): 4 mg via INTRAVENOUS
  Filled 2019-11-18 (×2): qty 2

## 2019-11-18 MED ORDER — SODIUM CHLORIDE 0.9% FLUSH
3.0000 mL | Freq: Two times a day (BID) | INTRAVENOUS | Status: DC
  Administered 2019-11-18 – 2019-11-19 (×2): 3 mL via INTRAVENOUS

## 2019-11-18 MED ORDER — ASPIRIN 81 MG PO CHEW: 81.0000 mg | CHEWABLE_TABLET | Freq: Every day | ORAL | Status: DC

## 2019-11-18 MED ORDER — ONDANSETRON HCL 4 MG/2ML IJ SOLN
4.0000 mg | Freq: Once | INTRAMUSCULAR | Status: AC
  Administered 2019-11-18: 4 mg via INTRAVENOUS
  Filled 2019-11-18: qty 2

## 2019-11-18 MED ORDER — FAMOTIDINE 20 MG PO TABS: 20.0000 mg | ORAL_TABLET | Freq: Two times a day (BID) | ORAL | Status: DC | PRN

## 2019-11-18 MED ORDER — ZOLPIDEM TARTRATE 5 MG PO TABS: 5.0000 mg | ORAL_TABLET | Freq: Every evening | ORAL | Status: DC | PRN

## 2019-11-18 MED ORDER — SODIUM CHLORIDE 0.9% FLUSH: 3.0000 mL | INTRAVENOUS | Status: DC | PRN

## 2019-11-18 MED ORDER — LOSARTAN POTASSIUM 50 MG PO TABS
100.0000 mg | ORAL_TABLET | Freq: Every day | ORAL | Status: DC
  Filled 2019-11-18: qty 2

## 2019-11-18 MED ORDER — ASPIRIN 81 MG PO CHEW
324.0000 mg | CHEWABLE_TABLET | ORAL | Status: AC
  Filled 2019-11-18: qty 4

## 2019-11-18 MED ORDER — ASPIRIN 300 MG RE SUPP: 300.0000 mg | RECTAL | Status: AC

## 2019-11-18 MED ORDER — HEPARIN BOLUS VIA INFUSION
4000.0000 [IU] | Freq: Once | INTRAVENOUS | Status: AC
  Administered 2019-11-18: 4000 [IU] via INTRAVENOUS
  Filled 2019-11-18: qty 4000

## 2019-11-18 MED ORDER — SODIUM CHLORIDE 0.9 % IV SOLN: 250.0000 mL | INTRAVENOUS | Status: DC | PRN

## 2019-11-18 MED ORDER — SODIUM CHLORIDE 0.9 % IV BOLUS
1000.0000 mL | Freq: Once | INTRAVENOUS | Status: AC
  Administered 2019-11-18: 1000 mL via INTRAVENOUS

## 2019-11-18 MED ORDER — MORPHINE SULFATE (PF) 4 MG/ML IV SOLN
4.0000 mg | Freq: Once | INTRAVENOUS | Status: AC
  Administered 2019-11-18: 4 mg via INTRAVENOUS
  Filled 2019-11-18: qty 1

## 2019-11-18 MED ORDER — NITROGLYCERIN 0.4 MG SL SUBL: 0.4000 mg | SUBLINGUAL_TABLET | SUBLINGUAL | Status: DC | PRN

## 2019-11-18 MED ORDER — METOPROLOL TARTRATE 12.5 MG HALF TABLET
12.5000 mg | ORAL_TABLET | Freq: Every day | ORAL | Status: DC
  Administered 2019-11-19: 12.5 mg via ORAL
  Filled 2019-11-18: qty 1

## 2019-11-18 MED ORDER — EZETIMIBE 10 MG PO TABS
10.0000 mg | ORAL_TABLET | Freq: Every day | ORAL | Status: DC
  Administered 2019-11-19: 10 mg via ORAL
  Filled 2019-11-18: qty 1

## 2019-11-18 MED ORDER — ACETAMINOPHEN 325 MG PO TABS: 650.0000 mg | ORAL_TABLET | ORAL | Status: DC | PRN

## 2019-11-18 MED ORDER — ASPIRIN EC 81 MG PO TBEC
81.0000 mg | DELAYED_RELEASE_TABLET | Freq: Every day | ORAL | Status: DC
  Administered 2019-11-19: 81 mg via ORAL
  Filled 2019-11-18: qty 1

## 2019-11-18 MED ORDER — ROSUVASTATIN CALCIUM 20 MG PO TABS
40.0000 mg | ORAL_TABLET | Freq: Every day | ORAL | Status: DC
  Administered 2019-11-19: 40 mg via ORAL
  Filled 2019-11-18: qty 2

## 2019-11-18 NOTE — ED Notes (Signed)
Dinner trey delivered

## 2019-11-18 NOTE — ED Provider Notes (Signed)
[facility] EMERGENCY DEPARTMENT Provider Note   CSN: [id number] Arrival date & time: 11/18/19  0355     History Chief Complaint  Patient presents with  . Chest Pain    Veronica [name] is a 69 y.o. female with a past medical history of CAD status post mid to distal LAD drug-eluting stent placed2015, hypertension, diabetes, GERD, presenting to the ED with a chief complaint of chest pain.  Approximately 6 AM woke up with left-sided chest pain that will intermittently radiate to her arm.  Reports associated shortness of breath when the pain worsens.  She reports nausea but denies any vomiting.  She took 1 dose of nitroglycerin with some improvement in her symptoms although the pain then returned.  She took a second dose of nitroglycerin without much improvement.  She also took a full dose of aspirin as advised by EMS.  Denies any leg swelling.  Denies any abdominal pain, fever, cough, injury or trauma.  HPI     Past Medical History:  Diagnosis Date  . Angina, class IV ([place]) 01/2014  . Anxiety    "menopausal related", history of  . CAD S/P mLAD Bifurcation PCI: Resolute DES 2.25 mm x 18 mm(2.75 mm), 2.0 mm Angiosculpt of Diag  01/08/2014   [place] 0,1,1 midLAD-Diag lesion - progressed from Moderate to Severe 90% LAD, 70-80% Diag from 6-12 2015 --> Class IV Angina --> DES PCI with Angiosculpt PCI of Diag  FFR of m-dRCA 50-70% = 0.9   . Complication of anesthesia    extreme sore throat and hoarseness after aneathesia, also "woke up screamimg" after surgery once"  . Coronary artery disease involving native coronary artery with angina pectoris with documented spasm ([place]) 06/2013   h/o moderate 3 Vessel CAD with mLAD-Diag bifurcation ([place] 0,1,1)  . Diabetes mellitus without complication ([place])   . Essential hypertension    stress test 10 yrs ago.   pcp   dr Lennette Bihari little  . GERD (gastroesophageal reflux disease)   . Headache    Migraines childhood  . History of migraine  headaches    "as a child"  . Hyperlipemia   . Hyperthyroidism    "had radioactive iodine tx in the 1980's"  . Hypothyroidism   . Osteoarthritis    "joints ache"  . Osteopenia     Patient Active Problem List   Diagnosis Date Noted  . Chest pain 11/18/2019  . Traumatic hematoma of right upper arm 01/15/2014  . CAD S/P mLAD Bifurcation PCI: Resolute DES 2.25 mm x 18 mm(2.75 mm), 2.0 mm Angiosculpt of Diag  01/08/2014    Class: Diagnosis of  . Essential hypertension 07/04/2013  . Hyperlipidemia 07/04/2013  . Hypothyroidism 07/04/2013  . Cervical radiculopathy 04/04/2011    Past Surgical History:  Procedure Laterality Date  . BACK SURGERY    . BREAST CYST ASPIRATION Left 04/1999   "done in dr's office"  . CARDIAC CATHETERIZATION  06/2013  . CARDIAC CATHETERIZATION  01/08/2014   Procedure: INTRAVASCULAR PRESSURE WIRE/FFR STUDY;  Surgeon: Sinclair Grooms, MD;  Location: [facility] CATH LAB;  Service: Cardiovascular;;  distal RCA  . CARDIAC CATHETERIZATION  01/08/2014   Procedure: CORONARY BALLOON ANGIOPLASTY;  Surgeon: Sinclair Grooms, MD;  Location: [facility] CATH LAB;  Service: Cardiovascular;;  diag  . COLONOSCOPY    . CORONARY ANGIOPLASTY WITH STENT PLACEMENT  01/08/2014   "1"  . DILATATION & CURETTAGE/HYSTEROSCOPY WITH MYOSURE N/A 03/24/2016   Procedure: DILATATION & CURETTAGE/HYSTEROSCOPY WITH MYOSURE;  Surgeon:  Megan Salon, MD;  Location: [facility];  Service: Gynecology;  Laterality: N/A;  . DILATION AND CURETTAGE OF UTERUS    . FRACTURE SURGERY    . LAPAROSCOPIC CHOLECYSTECTOMY  4/97  . LEFT HEART CATHETERIZATION WITH CORONARY ANGIOGRAM N/A 07/04/2013   Procedure: LEFT HEART CATHETERIZATION WITH CORONARY ANGIOGRAM;  Surgeon: Sinclair Grooms, MD;  Location: [facility] CATH LAB;  Service: Cardiovascular;  Laterality: N/A;  . LEFT HEART CATHETERIZATION WITH CORONARY ANGIOGRAM N/A 01/08/2014   Procedure: LEFT HEART CATHETERIZATION WITH CORONARY ANGIOGRAM;  Surgeon: Sinclair Grooms, MD;  Location: [facility]  CATH LAB;  Service: Cardiovascular;  Laterality: N/A;  . MOUTH SURGERY    . PERCUTANEOUS STENT INTERVENTION  01/08/2014   Procedure: PERCUTANEOUS STENT INTERVENTION;  Surgeon: Sinclair Grooms, MD;  Location: [facility] CATH LAB;  Service: Cardiovascular;;  MID LAD  . POSTERIOR CERVICAL FUSION/FORAMINOTOMY  04/04/2011   Procedure: POSTERIOR CERVICAL FUSION/FORAMINOTOMY LEVEL 1;  Surgeon: Charlie Pitter, MD;  Location: [place] [facility];  Service: Neurosurgery;  Laterality: Left;  Left Cervical Six-Seven Laminectomy, Foraminotomy, Diskectomy   . TONSILLECTOMY    . WISDOM TOOTH EXTRACTION    . WRIST FRACTURE SURGERY Right 1990's     OB History    Gravida  2   Para  2   Term      Preterm      AB      Living  2     SAB      TAB      Ectopic      Multiple      Live Births              Family History  Problem Relation Age of Onset  . Heart disease Father   . Hypertension Father   . Diabetes type II Father   . Heart disease Mother   . Hypertension Mother   . Hypertension Sister   . Heart Problems Sister   . Asthma Sister   . Hypertension Brother   . Hypertension Sister     Social History   Tobacco Use  . Smoking status: Never Smoker  . Smokeless tobacco: Never Used  Vaping Use  . Vaping Use: Never used  Substance Use Topics  . Alcohol use: No  . Drug use: No    Home Medications Prior to Admission medications   Medication Sig Start Date End Date Taking? Authorizing Provider  aspirin 81 MG chewable tablet Chew 1 tablet (81 mg total) by mouth daily. 01/09/14  Yes Almyra Deforest, PA  augmented betamethasone dipropionate (DIPROLENE-AF) 0.05 % cream Apply 1 application topically daily as needed (for skin irritation).  09/16/13  Yes [provider]  cyclobenzaprine (FLEXERIL) 10 MG tablet Take 10 mg by mouth 3 (three) times daily as needed for muscle spasms.  11/26/13  Yes [provider]  diphenhydrAMINE (BENADRYL) 25 mg capsule Take 25 mg by mouth every 6 (six)  hours as needed for sleep.   Yes [provider]  estradiol (ESTRACE) 0.1 MG/GM vaginal cream Place 1 Applicatorful vaginally daily as needed. 1 gram vaginally daily prn Patient taking differently: Place 1 Applicatorful vaginally once a week.  01/28/19  Yes Megan Salon, MD  ezetimibe (ZETIA) 10 MG tablet TAKE 1 TABLET BY MOUTH EVERY DAY Patient taking differently: Take 10 mg by mouth daily.  06/30/19  Yes Belva Crome, MD  famotidine (PEPCID) 20 MG tablet Take 20 mg by mouth 2 (two) times daily as needed for  heartburn or indigestion.   Yes [provider]  fish oil-omega-3 fatty acids 1000 MG capsule Take 1 g by mouth daily.   Yes [provider]  halobetasol (ULTRAVATE) 0.05 % cream Apply 1 application topically daily as needed (for skin irritation).  09/18/13  Yes [provider]  hydrocortisone valerate cream (WESTCORT) 0.2 % Apply 1 application topically 2 (two) times daily as needed (rash).  04/21/13  Yes [provider]  levothyroxine (SYNTHROID, LEVOTHROID) 88 MCG tablet Take 100 mcg by mouth daily before breakfast.   Yes [provider]  losartan (COZAAR) 100 MG tablet Take 100 mg by mouth daily.    Yes [provider]  metoprolol tartrate (LOPRESSOR) 25 MG tablet Take 1/2 (one-half) tablet by mouth once daily Patient taking differently: Take 12.5 mg by mouth daily.  04/03/19  Yes Belva Crome, MD  Multiple Vitamins-Minerals (PRESERVISION AREDS PO) Take 1 capsule by mouth daily.   Yes [provider]  nitroGLYCERIN (NITROSTAT) 0.4 MG SL tablet PLACE 1 TABLET (0.4 MG TOTAL) UNDER THE TONGUE EVERY 5 (FIVE) MINUTES AS NEEDED FOR CHEST PAIN. 09/02/18  Yes Belva Crome, MD  NONFORMULARY OR COMPOUNDED ITEM [facility]:  Antifungal topical - Terbinafine 3%, Fluconazole 2%, Tea Tree Oil 5%, Urea 10%, Ibuprofen 2%, in #57ml DMSO suspension. Apply to affected toenail(s) once daily (at bedtime) or twice daily 01/21/19   Yes Trula Slade, DPM  [facility] VERIO test strip 1 each by Other route every other day.  09/08/19  Yes [provider]  rosuvastatin (CRESTOR) 40 MG tablet Take 40 mg by mouth daily.   Yes [provider]    Allergies    Ace inhibitors, Prilosec [omeprazole magnesium], Sulfa antibiotics, and Ultram [tramadol hcl]  Review of Systems   Review of Systems  Constitutional: Negative for appetite change, chills and fever.  HENT: Negative for ear pain, rhinorrhea, sneezing and sore throat.   Eyes: Negative for photophobia and visual disturbance.  Respiratory: Negative for cough, chest tightness, shortness of breath and wheezing.   Cardiovascular: Positive for chest pain. Negative for palpitations.  Gastrointestinal: Negative for abdominal pain, blood in stool, constipation, diarrhea, nausea and vomiting.  Genitourinary: Negative for dysuria, hematuria and urgency.  Musculoskeletal: Negative for myalgias.  Skin: Negative for rash.  Neurological: Negative for dizziness, weakness and light-headedness.    Physical Exam Updated Vital Signs BP 138/75   Pulse 61   Temp 98.4 F (36.9 C) (Oral)   Resp 11   Ht 5\' 4"  (1.626 m)   Wt 77.1 kg   LMP 02/06/2010 Comment: bleeding 12-14-2018  SpO2 100%   BMI 29.18 kg/m   Physical Exam Vitals and nursing note reviewed.  Constitutional:      General: She is not in acute distress.    Appearance: She is well-developed.     Comments: Appears uncomfortable.  HENT:     Head: Normocephalic and atraumatic.     Nose: Nose normal.  Eyes:     General: No scleral icterus.       Left eye: No discharge.     Conjunctiva/sclera: Conjunctivae normal.  Cardiovascular:     Rate and Rhythm: Normal rate and regular rhythm.     Heart sounds: Normal heart sounds. No murmur heard.  No friction rub. No gallop.   Pulmonary:     Effort: Pulmonary effort is normal. No respiratory distress.     Breath sounds: Normal breath sounds.  Abdominal:      General: Bowel  sounds are normal. There is no distension.     Palpations: Abdomen is soft.     Tenderness: There is no abdominal tenderness. There is no guarding.  Musculoskeletal:        General: Normal range of motion.     Cervical back: Normal range of motion and neck supple.     Right lower leg: No edema.     Left lower leg: No edema.  Skin:    General: Skin is warm and dry.     Findings: No rash.  Neurological:     Mental Status: She is alert.     Motor: No abnormal muscle tone.     Coordination: Coordination normal.     ED Results / Procedures / Treatments   Labs (all labs ordered are listed, but only abnormal results are displayed) Labs Reviewed  BASIC METABOLIC PANEL - Abnormal; Notable for the following components:      Result Value   Glucose, Bld 134 (*)    All other components within normal limits  RESPIRATORY PANEL BY RT PCR (FLU A&B, COVID)  CBC  PROTIME-INR  HEPARIN LEVEL (UNFRACTIONATED)  HIV ANTIBODY (ROUTINE TESTING W REFLEX)  MAGNESIUM  TSH  HEMOGLOBIN A1C  TROPONIN I (HIGH SENSITIVITY)  TROPONIN I (HIGH SENSITIVITY)    EKG EKG Interpretation  Date/Time:  Tuesday November 18 2019 08:16:04 EDT Ventricular Rate:  82 PR Interval:  168 QRS Duration: 86 QT Interval:  394 QTC Calculation: 460 R Axis:   43 Text Interpretation: Normal sinus rhythm Normal ECG No STEMI Confirmed by Octaviano Glow [phone]) on 11/18/2019 9:08:13 AM   Radiology DG Chest 2 View  Result Date: 11/18/2019 CLINICAL DATA:  Chest pain and shortness of breath EXAM: CHEST - 2 VIEW COMPARISON:  Jul 03, 2013 FINDINGS: There is mild atelectatic change in the left base. The lungs elsewhere are clear. The heart size and pulmonary vascularity are normal. No adenopathy. No pneumothorax. No bone lesions. IMPRESSION: Mild left base atelectasis. Lungs elsewhere clear. Heart size normal. No adenopathy. Electronically Signed   By: Lowella Grip III M.D.   On: 11/18/2019 08:45     Procedures .Critical Care Performed by: Delia Heady, PA-C Authorized by: Delia Heady, PA-C   Critical care provider statement:    Critical care time (minutes):  45   Critical care was necessary to treat or prevent imminent or life-threatening deterioration of the following conditions:  Cardiac failure and circulatory failure   Critical care was time spent personally by me on the following activities:  Development of treatment plan with patient or surrogate, discussions with consultants, evaluation of patient's response to treatment, examination of patient, obtaining history from patient or surrogate, ordering and performing treatments and interventions, ordering and review of laboratory studies, ordering and review of radiographic studies, pulse oximetry, re-evaluation of patient's condition and review of old charts   I assumed direction of critical care for this patient from another provider in my specialty: no     (including critical care time)  Medications Ordered in ED Medications  heparin ADULT infusion 100 units/mL (25000 units/272mL sodium chloride 0.45%) (900 Units/hr Intravenous New Bag/Given 11/18/19 1137)  aspirin chewable tablet 81 mg (has no administration in time range)  ezetimibe (ZETIA) tablet 10 mg (has no administration in time range)  losartan (COZAAR) tablet 100 mg (has no administration in time range)  metoprolol tartrate (LOPRESSOR) tablet 12.5 mg (has no administration in time range)  rosuvastatin (CRESTOR) tablet 40 mg (has no administration in time range)  levothyroxine (SYNTHROID) tablet 100 mcg (has no administration in time range)  famotidine (PEPCID) tablet 20 mg (has no administration in time range)  cyclobenzaprine (FLEXERIL) tablet 10 mg (has no administration in time range)  aspirin chewable tablet 324 mg (has no administration in time range)    Or  aspirin suppository 300 mg (has no administration in time range)  aspirin EC tablet 81 mg (has no  administration in time range)  nitroGLYCERIN (NITROSTAT) SL tablet 0.4 mg (has no administration in time range)  acetaminophen (TYLENOL) tablet 650 mg (has no administration in time range)  ondansetron (ZOFRAN) injection 4 mg (has no administration in time range)  sodium chloride flush (NS) 0.9 % injection 3 mL (has no administration in time range)  sodium chloride flush (NS) 0.9 % injection 3 mL (has no administration in time range)  0.9 %  sodium chloride infusion (has no administration in time range)  ALPRAZolam (XANAX) tablet 0.25 mg (has no administration in time range)  zolpidem (AMBIEN) tablet 5 mg (has no administration in time range)  sodium chloride flush (NS) 0.9 % injection 3 mL (has no administration in time range)  sodium chloride 0.9 % bolus 1,000 mL (0 mLs Intravenous Stopped 11/18/19 1143)  morphine 4 MG/ML injection 4 mg (4 mg Intravenous Given 11/18/19 0948)  ondansetron (ZOFRAN) injection 4 mg (4 mg Intravenous Given 11/18/19 0946)  heparin bolus via infusion 4,000 Units (4,000 Units Intravenous Bolus from Bag 11/18/19 1144)    ED Course  I have reviewed the triage vital signs and the nursing notes.  Pertinent labs & imaging results that were available during my care of the patient were reviewed by me and considered in my medical decision making (see chart for details).  Clinical Course as of Nov 18 1507  Tue Nov 18, 2019  0940 69 yo female w/ hx of CAD s/p stent, multivessel disease, on aspirin 81 mg and metoprolol, presenting to the ED with chest pain and jaw pain that she woke up with this morning around 0600.  She reports dyspnea and chest tightness with walking and exertion for "some time," that her cardiologist Dr Pernell Dupre was aware of.  Today's symptoms are much more intense.  Usually they resolve with Sl nitro at home but today she got very minimal relief from nitro.  EMS gave full dose aspirin.  On my exam she is HD stable, sitting upright, lungs CTAB, in mild  distress due to chest discomfort.  BP 101/82.  I would refrain from more nitro and give some IV opioid if needed for pain.  ECG on arrival shows NSR with no STEMI.  Hx however is quite concerning for unstable angina.  We will f/u trop, discuss with cardiology, anticipate medical admission and heparin infusion.     [MT]  [id number] Per dr smith's 2020 office note, "patient has hx of DM II, hyperlipidemia, hypertension, and mid to distal LAD drug-eluting stent placed 2015."   [MT]  1019 EKG shows normal sinus rhythm.  I have ordered cardiology consult, IV morphine and IV fluids for what appears to be unstable angina.  Troponin I (High Sensitivity): 4 [HK]  1023 Spoke to card master about plan.  They will see the patient at bedside.  I have ordered heparin.   [HK]  5284 Patient denies any active bleeding.   [HK]  1202 Trop 4 -> 6.  Heparin ordered for unstable angina.   [MT]  1324 Pt having PVC on the monitor, occasional 3-4 beat run  of V Tach.  She symptomatically is feeling better with improvement of her CP, although "it's still there," and some improvement of her subjective SOB.  BP stable.  Heparin running.  Awaiting cardiology consult.  If she has longer V Tach runs we can start amiodarone.   [MT]    Clinical Course User Index [HK] Delia Heady, PA-C [MT] Wyvonnia Dusky, MD   MDM Rules/Calculators/A&P                          69 year old female with past medical history of CAD status post mid to distal LAD drug-eluting stent placed in 2015, hypertension, diabetes and GERD presenting to the ED with a chief complaint of chest pain.  Left-sided chest pain that woke her up from her sleep at approximately 6 AM this morning.  Reports associated shortness of breath and radiation of pain to the left arm.  She has taken 2 nitroglycerin, the first 1 helped intermittently.  She also took a full dose aspirin as advised by EMS.  Denies leg swelling.  No cough or fever.  On exam patient appears uncomfortable.   No tenderness palpation of the chest wall.  No lower extremity edema, erythema or calf tenderness abdomen for DVT.  Vital signs are within normal limits, blood pressure in the 505X systolic.  Will hold off on additional nitro for now.  I have ordered IV morphine and IV fluids.  Her EKG shows normal sinus rhythm, no ischemic changes, no STEMI.  We will need to obtain labs.  Initial delta troponin are both negative.  She reports improvement in her symptoms with morphine.  However due to her cardiac history and symptoms at this time high suspicion for unstable angina as a cause of her symptoms.  Will consult cardiology for further recommendations.  Have started her on IV heparin for unstable angina.  Cardiology to admit patient.  Patient remains hemodynamically stable and her symptoms have improved.   Portions of this note were generated with [employer]. Dictation errors may occur despite best attempts at proofreading.  Final Clinical Impression(s) / ED Diagnoses Final diagnoses:  Unstable angina [facility])    Rx / DC Orders ED Discharge Orders    None       Delia Heady, PA-C 11/18/19 1510    Wyvonnia Dusky, MD 11/18/19 [phone]

## 2019-11-18 NOTE — Progress Notes (Signed)
Patient arrived from ED accompanied by husband. Safety precautions and orders reviewed with patient/family. TELE applied and confirmed. All questions answered at this time. Will continue to monitor.  Ave Filter, RN

## 2019-11-18 NOTE — Progress Notes (Signed)
  Echocardiogram 2D Echocardiogram has been performed.  Veronica [name] 11/18/2019, 5:23 PM

## 2019-11-18 NOTE — Progress Notes (Signed)
ANTICOAGULATION CONSULT NOTE - Initial Consult  Pharmacy Consult for heparin Indication: chest pain/ACS  Allergies  Allergen Reactions  . Ace Inhibitors Cough  . Prilosec [Omeprazole Magnesium] Other (See Comments)    Blisters and skin peels off  . Sulfa Antibiotics Other (See Comments)    Blisters and skin peels off  . Ultram [Tramadol Hcl]     Lapse of memory      Patient Measurements: Height: 5\' 4"  (162.6 cm) Weight: 77.1 kg (170 lb) IBW/kg (Calculated) : 54.7 Heparin Dosing Weight: 71 kg   Vital Signs: Temp: 98.4 F (36.9 C) (10/12 0848) Temp Source: Oral (10/12 0848) BP: 162/90 (10/12 1015) Pulse Rate: 76 (10/12 1015)  Labs: Recent Labs    11/18/19 0821  HGB 14.6  HCT 45.0  PLT 182  CREATININE 0.82  TROPONINIHS 4    Estimated Creatinine Clearance: 66.9 mL/min (by C-G formula based on SCr of 0.82 mg/dL).   Medical History: Past Medical History:  Diagnosis Date  . Angina, class IV ([place]) 01/2014  . Anxiety    "menopausal related", history of  . CAD S/P mLAD Bifurcation PCI: Resolute DES 2.25 mm x 18 mm(2.75 mm), 2.0 mm Angiosculpt of Diag  01/08/2014   [place] 0,1,1 midLAD-Diag lesion - progressed from Moderate to Severe 90% LAD, 70-80% Diag from 6-12 2015 --> Class IV Angina --> DES PCI with Angiosculpt PCI of Diag  FFR of m-dRCA 50-70% = 0.9   . Complication of anesthesia    extreme sore throat and hoarseness after aneathesia, also "woke up screamimg" after surgery once"  . Coronary artery disease involving native coronary artery with angina pectoris with documented spasm ([place]) 06/2013   h/o moderate 3 Vessel CAD with mLAD-Diag bifurcation ([place] 0,1,1)  . Diabetes mellitus without complication ([place])   . Essential hypertension    stress test 10 yrs ago.   pcp   dr Lennette Bihari little  . GERD (gastroesophageal reflux disease)   . Headache    Migraines childhood  . History of migraine headaches    "as a child"  . Hyperlipemia   . Hyperthyroidism    "had  radioactive iodine tx in the 1980's"  . Hypothyroidism   . Osteoarthritis    "joints ache"  . Osteopenia     Medications:  (Not in a hospital admission)   Assessment: Veronica [name] with h/o CAD s/p stent placement in 2015 here with chest pain concerning for ACS. Pharmacy consulted to start IV heparin.   H/H and Plt wnl. SCr wnl   Goal of Therapy:  Heparin level 0.3-0.7 units/ml Monitor platelets by anticoagulation protocol: Yes   Plan:  -Heparin 4000 units IV bolus followed IV heparin infusion at 900 units/hr  -F/u 6 hr HL -Monitor daily HL, CBC and s/s of bleeding   Albertina Parr, PharmD., BCPS, BCCCP Clinical Pharmacist Please refer to [facility] for unit-specific pharmacist

## 2019-11-18 NOTE — ED Triage Notes (Signed)
Pt bib ems from home with CP onset 0600 when she woke up. Pt with cardiac hx, stent placement. 324mg  asa and 1 SL nitro with relief. Pain radiates to neck and back. Pt also endorses nausea. Initial oxygen was 97%, pt dropped to the high 80's, placed on 2L [place] with improvement to 96% 160/100 HR 80 CBG 133 96% 2L [place]

## 2019-11-18 NOTE — Progress Notes (Signed)
ANTICOAGULATION CONSULT NOTE - Follow Up Consult  Pharmacy Consult for Heparin Indication: chest pain/ACS  Allergies  Allergen Reactions  . Ace Inhibitors Cough  . Prilosec [Omeprazole Magnesium] Other (See Comments)    Blisters and skin peels off  . Sulfa Antibiotics Other (See Comments)    Blisters and skin peels off  . Ultram [Tramadol Hcl]     Lapse of memory      Patient Measurements: Height: 5\' 4"  (162.6 cm) Weight: 77.1 kg (170 lb) IBW/kg (Calculated) : 54.7 Heparin Dosing Weight: 71 kg   Vital Signs: Temp: 98.5 F (36.9 C) (10/12 1847) Temp Source: Oral (10/12 1847) BP: 156/72 (10/12 1847) Pulse Rate: 81 (10/12 1847)  Labs: Recent Labs    11/18/19 0821 11/18/19 1038 11/18/19 1710  HGB 14.6  --   --   HCT 45.0  --   --   PLT 182  --   --   LABPROT  --  12.3  --   INR  --  1.0  --   HEPARINUNFRC  --   --  0.99*  CREATININE 0.82  --   --   TROPONINIHS 4 6  --     Estimated Creatinine Clearance: 66.9 mL/min (by C-G formula based on SCr of 0.82 mg/dL).   Medical History: Past Medical History:  Diagnosis Date  . Angina, class IV ([place]) 01/2014  . Anxiety    "menopausal related", history of  . CAD S/P mLAD Bifurcation PCI: Resolute DES 2.25 mm x 18 mm(2.75 mm), 2.0 mm Angiosculpt of Diag  01/08/2014   [place] 0,1,1 midLAD-Diag lesion - progressed from Moderate to Severe 90% LAD, 70-80% Diag from 6-12 2015 --> Class IV Angina --> DES PCI with Angiosculpt PCI of Diag  FFR of m-dRCA 50-70% = 0.9   . Complication of anesthesia    extreme sore throat and hoarseness after aneathesia, also "woke up screamimg" after surgery once"  . Coronary artery disease involving native coronary artery with angina pectoris with documented spasm ([place]) 06/2013   h/o moderate 3 Vessel CAD with mLAD-Diag bifurcation ([place] 0,1,1)  . Diabetes mellitus without complication ([place])   . Essential hypertension    stress test 10 yrs ago.   pcp   dr Lennette Bihari little  . GERD (gastroesophageal  reflux disease)   . Headache    Migraines childhood  . History of migraine headaches    "as a child"  . Hyperlipemia   . Hyperthyroidism    "had radioactive iodine tx in the 1980's"  . Hypothyroidism   . Osteoarthritis    "joints ache"  . Osteopenia     Assessment: 69 yr old female with hx of CAD (S/P stent placement in 2015) presented with chest pain concerning for ACS. Pharmacy was consulted to start IV heparin; pt was on no anticoagulation PTA.  Initial heparin level ~6 hrs after heparin 4000 units IV bolus, followed by  Heparin infusion at 900 units/hr, was 0.99 units/ml, which is above the goal range for this pt. H/H, platelets WNL. Per RN, no issues with IV or bleeding observed.  Cardiac cath planned for tomorrow, 11/19/19.  Goal of Therapy:  Heparin level 0.3-0.7 units/ml Monitor platelets by anticoagulation protocol: Yes   Plan:  Decrease heparin infusion to 750 units/hr Check 6-hr heparin level Monitor daily heparin level, CBC Monitor for signs/symptoms of bleeding  F/U cath results/plans  Gillermina Hu, PharmD, BCPS, [facility] Clinical Pharmacist 11/18/19, 19:15 PM

## 2019-11-18 NOTE — H&P (Addendum)
Cardiology Admission History and Physical:   Patient ID: Veronica [name] MRN: [id number]; DOB: April 19, 1950   Admission date: 11/18/2019  Primary Care Provider: Hulan Fess, MD [facility] Cardiologist: Sinclair Grooms, MD  [place] Electrophysiologist:  None   Chief Complaint:  Chest pain  Patient Profile:   Veronica [name] is a 69 y.o. female with DM2, HLD, HTN, hypothyroidism, CAD with DES to LAD in 2015 who is being seen for chest pain.   History of Present Illness:   Veronica [name] is followed by Dr. Tamala Julian for the above cardiac issues. She had stenting to the mid to distal LAD in 2015. FFR of the RCA (50-70%) was performed which was a non-critical value of 0.9. LV function was normal. It also showed diffuse pLAD disease 30-35% and moderate mid circumflex and first marginal diffuse disease, less than 50% obstructed. Patient had a Myoview stress test 02/2017 showing EF 65%, with a medium defect of moderate severity, but overall low risk study. Patient was last seen 04/03/2019 and was symptomatically stable. Reported rare NTG use.   The patient presents to the ED 11/18/19 for chest pain. Pain woke her up this morning at 6AM. It was left-sided and radiating into her left shoulder, up her neck, and into her back. It was pressure like in nature. Up to 5/10. She felt mild sob with nausea, dry heaving and diaphoresis. After about 10 minutes she took SL [facility] which minimally improved the pain. She ultimately took another [employer] which did not seem to help the pain. She took full dose aspirin. Says pain was similar to prior PCI in 2015 but different than her normal angina pain. EMS was called who brought her to the ED for further work-up. She denies recent fever, chills, LLE, orthopnea, and pain, lightheadedness. She is pretty active, walking every morning and biking at night. She has angina pretty regularly and takes SL Nitro about once a month.   In the ED BP 101/82, pulse 76, temp 98.4. RR  12, 98% O2. Lab results showed glucse 134, creatinine 0.82, WBC 6.3, Hgb 14.6. HS troponin 4. COVID negative. EKG without any ischemic changes. CXR showed mild left base atelectasis. Cardiology was asked to admit.    Past Medical History:  Diagnosis Date  . Angina, class IV ([place]) 01/2014  . Anxiety    "menopausal related", history of  . CAD S/P mLAD Bifurcation PCI: Resolute DES 2.25 mm x 18 mm(2.75 mm), 2.0 mm Angiosculpt of Diag  01/08/2014   [place] 0,1,1 midLAD-Diag lesion - progressed from Moderate to Severe 90% LAD, 70-80% Diag from 6-12 2015 --> Class IV Angina --> DES PCI with Angiosculpt PCI of Diag  FFR of m-dRCA 50-70% = 0.9   . Complication of anesthesia    extreme sore throat and hoarseness after aneathesia, also "woke up screamimg" after surgery once"  . Coronary artery disease involving native coronary artery with angina pectoris with documented spasm ([place]) 06/2013   h/o moderate 3 Vessel CAD with mLAD-Diag bifurcation ([place] 0,1,1)  . Diabetes mellitus without complication ([place])   . Essential hypertension    stress test 10 yrs ago.   pcp   dr Lennette Bihari little  . GERD (gastroesophageal reflux disease)   . Headache    Migraines childhood  . History of migraine headaches    "as a child"  . Hyperlipemia   . Hyperthyroidism    "had radioactive iodine tx in the 1980's"  . Hypothyroidism   . Osteoarthritis    "  joints ache"  . Osteopenia     Past Surgical History:  Procedure Laterality Date  . BACK SURGERY    . BREAST CYST ASPIRATION Left 04/1999   "done in dr's office"  . CARDIAC CATHETERIZATION  06/2013  . CARDIAC CATHETERIZATION  01/08/2014   Procedure: INTRAVASCULAR PRESSURE WIRE/FFR STUDY;  Surgeon: Sinclair Grooms, MD;  Location: [facility] CATH LAB;  Service: Cardiovascular;;  distal RCA  . CARDIAC CATHETERIZATION  01/08/2014   Procedure: CORONARY BALLOON ANGIOPLASTY;  Surgeon: Sinclair Grooms, MD;  Location: [facility] CATH LAB;  Service: Cardiovascular;;  diag  . COLONOSCOPY    .  CORONARY ANGIOPLASTY WITH STENT PLACEMENT  01/08/2014   "1"  . DILATATION & CURETTAGE/HYSTEROSCOPY WITH MYOSURE N/A 03/24/2016   Procedure: DILATATION & CURETTAGE/HYSTEROSCOPY WITH MYOSURE;  Surgeon: Megan Salon, MD;  Location: [facility];  Service: Gynecology;  Laterality: N/A;  . DILATION AND CURETTAGE OF UTERUS    . FRACTURE SURGERY    . LAPAROSCOPIC CHOLECYSTECTOMY  4/97  . LEFT HEART CATHETERIZATION WITH CORONARY ANGIOGRAM N/A 07/04/2013   Procedure: LEFT HEART CATHETERIZATION WITH CORONARY ANGIOGRAM;  Surgeon: Sinclair Grooms, MD;  Location: [facility] CATH LAB;  Service: Cardiovascular;  Laterality: N/A;  . LEFT HEART CATHETERIZATION WITH CORONARY ANGIOGRAM N/A 01/08/2014   Procedure: LEFT HEART CATHETERIZATION WITH CORONARY ANGIOGRAM;  Surgeon: Sinclair Grooms, MD;  Location: [facility] CATH LAB;  Service: Cardiovascular;  Laterality: N/A;  . MOUTH SURGERY    . PERCUTANEOUS STENT INTERVENTION  01/08/2014   Procedure: PERCUTANEOUS STENT INTERVENTION;  Surgeon: Sinclair Grooms, MD;  Location: [facility] CATH LAB;  Service: Cardiovascular;;  MID LAD  . POSTERIOR CERVICAL FUSION/FORAMINOTOMY  04/04/2011   Procedure: POSTERIOR CERVICAL FUSION/FORAMINOTOMY LEVEL 1;  Surgeon: Charlie Pitter, MD;  Location: [place] [facility];  Service: Neurosurgery;  Laterality: Left;  Left Cervical Six-Seven Laminectomy, Foraminotomy, Diskectomy   . TONSILLECTOMY    . WISDOM TOOTH EXTRACTION    . WRIST FRACTURE SURGERY Right 1990's     Medications Prior to Admission: Prior to Admission medications   Medication Sig Start Date End Date Taking? Authorizing Provider  aspirin 81 MG chewable tablet Chew 1 tablet (81 mg total) by mouth daily. 01/09/14  Yes Almyra Deforest, PA  augmented betamethasone dipropionate (DIPROLENE-AF) 0.05 % cream Apply 1 application topically daily as needed (for skin irritation).  09/16/13  Yes [provider]  cyclobenzaprine (FLEXERIL) 10 MG tablet Take 10 mg by mouth 3 (three) times daily as needed for muscle  spasms.  11/26/13  Yes [provider]  diphenhydrAMINE (BENADRYL) 25 mg capsule Take 25 mg by mouth every 6 (six) hours as needed for sleep.   Yes [provider]  estradiol (ESTRACE) 0.1 MG/GM vaginal cream Place 1 Applicatorful vaginally daily as needed. 1 gram vaginally daily prn Patient taking differently: Place 1 Applicatorful vaginally once a week.  01/28/19  Yes Megan Salon, MD  ezetimibe (ZETIA) 10 MG tablet TAKE 1 TABLET BY MOUTH EVERY DAY Patient taking differently: Take 10 mg by mouth daily.  06/30/19  Yes Belva Crome, MD  famotidine (PEPCID) 20 MG tablet Take 20 mg by mouth 2 (two) times daily as needed for heartburn or indigestion.   Yes [provider]  fish oil-omega-3 fatty acids 1000 MG capsule Take 1 g by mouth daily.   Yes [provider]  halobetasol (ULTRAVATE) 0.05 % cream Apply 1 application topically daily as needed (for skin irritation).  09/18/13  Yes [provider]  hydrocortisone valerate cream (WESTCORT) 0.2 % Apply 1 application topically 2 (two) times daily as needed (rash).  04/21/13  Yes [provider]  levothyroxine (SYNTHROID, LEVOTHROID) 88 MCG tablet Take 100 mcg by mouth daily before breakfast.   Yes [provider]  losartan (COZAAR) 100 MG tablet Take 100 mg by mouth daily.    Yes [provider]  metoprolol tartrate (LOPRESSOR) 25 MG tablet Take 1/2 (one-half) tablet by mouth once daily Patient taking differently: Take 12.5 mg by mouth daily.  04/03/19  Yes Belva Crome, MD  Multiple Vitamins-Minerals (PRESERVISION AREDS PO) Take 1 capsule by mouth daily.   Yes [provider]  nitroGLYCERIN (NITROSTAT) 0.4 MG SL tablet PLACE 1 TABLET (0.4 MG TOTAL) UNDER THE TONGUE EVERY 5 (FIVE) MINUTES AS NEEDED FOR CHEST PAIN. 09/02/18  Yes Belva Crome, MD  NONFORMULARY OR COMPOUNDED ITEM  Apothecary:  Antifungal topical - Terbinafine 3%, Fluconazole 2%, Tea Tree Oil 5%, Urea  10%, Ibuprofen 2%, in #58ml DMSO suspension. Apply to affected toenail(s) once daily (at bedtime) or twice daily 01/21/19  Yes Trula Slade, DPM  [facility] VERIO test strip 1 each by Other route every other day.  09/08/19  Yes [provider]  rosuvastatin (CRESTOR) 40 MG tablet Take 40 mg by mouth daily.   Yes [provider]     Allergies:    Allergies  Allergen Reactions  . Ace Inhibitors Cough  . Prilosec [Omeprazole Magnesium] Other (See Comments)    Blisters and skin peels off  . Sulfa Antibiotics Other (See Comments)    Blisters and skin peels off  . Ultram [Tramadol Hcl]     Lapse of memory      Social History:   Social History   Socioeconomic History  . Marital status: Married    Spouse name: Not on file  . Number of children: Not on file  . Years of education: Not on file  . Highest education level: Not on file  Occupational History  . Not on file  Tobacco Use  . Smoking status: Never Smoker  . Smokeless tobacco: Never Used  Vaping Use  . Vaping Use: Never used  Substance and Sexual Activity  . Alcohol use: No  . Drug use: No  . Sexual activity: Yes    Partners: Male    Birth control/protection: Post-menopausal  Other Topics Concern  . Not on file  Social History Narrative  . Not on file   Social Determinants of Health   Financial Resource Strain:   . Difficulty of Paying Living Expenses: Not on file  Food Insecurity:   . Worried About Charity fundraiser in the Last Year: Not on file  . Ran Out of Food in the Last Year: Not on file  Transportation Needs:   . Lack of Transportation (Medical): Not on file  . Lack of Transportation (Non-Medical): Not on file  Physical Activity:   . Days of Exercise per Week: Not on file  . Minutes of Exercise per Session: Not on file  Stress:   . Feeling of Stress : Not on file  Social Connections:   . Frequency of Communication with Friends and Family: Not on file  . Frequency of Social  Gatherings with Friends and Family: Not on file  . Attends Religious Services: Not on file  . Active Member of Clubs or Organizations: Not on file  . Attends Archivist Meetings: Not on file  . Marital  Status: Not on file  Intimate Partner Violence:   . Fear of Current or Ex-Partner: Not on file  . Emotionally Abused: Not on file  . Physically Abused: Not on file  . Sexually Abused: Not on file    Family History:   The patient's family history includes Asthma in her sister; Diabetes type II in her father; Heart Problems in her sister; Heart disease in her father and mother; Hypertension in her brother, father, mother, sister, and sister.    ROS:  Please see the history of present illness.  All other ROS reviewed and negative.     Physical Exam/Data:   Vitals:   11/18/19 1045 11/18/19 1100 11/18/19 1130 11/18/19 1136  BP:    138/75  Pulse: 67 64 66 62  Resp: 13 10 19 14   Temp:      TempSrc:      SpO2: 100% 100% 100% 100%  Weight:      Height:       No intake or output data in the 24 hours ending 11/18/19 1329 Last 3 Weights 11/18/2019 04/03/2019 01/28/2019  Weight (lbs) 170 lb 170 lb 12.8 oz 170 lb  Weight (kg) 77.111 kg 77.474 kg 77.111 kg     Body mass index is 29.18 kg/m.  General:  Well nourished, well developed, in no acute distress HEENT: normal Lymph: no adenopathy Neck: no JVD Endocrine:  No thryomegaly Vascular: No carotid bruits; FA pulses 2+ bilaterally without bruits  Cardiac:  normal S1, S2; RRR; no murmur  Lungs:  clear to auscultation bilaterally, no wheezing, rhonchi or rales  Abd: soft, nontender, no hepatomegaly  Ext: no edema Musculoskeletal:  No deformities, BUE and BLE strength normal and equal Skin: warm and dry  Neuro:  CNs 2-12 intact, no focal abnormalities noted Psych:  Normal affect    EKG:  The ECG that was done 11/18/19 was personally reviewed and demonstrates NSR, 82bpm, nonspecific ST changes  Relevant CV  Studies:  Myoview stress test 02/2017  Nuclear stress EF: 65%.  Blood pressure demonstrated a normal response to exercise.  No T wave inversion was noted during stress.  There was no ST segment deviation noted during stress.  Defect 1: There is a medium defect of moderate severity.  This is a low risk study.   Medium size, moderate intensity fixed septal perfusion defect, likely artifact. No reversible ischemia. LVEF 65% with normal wall motion. This is a low risk study.  Cardia cath 2015 PROCEDURE: 1. Left heart catheterization; 2. Coronary angiography; 3. Left atrial artery; 4. FFR distal RCA; 5. DES mid LAD bifurcation  PCI RESULTS: Provisional stenting of the LAD bifurcation lesion following scoring balloon angioplasty of the ostium of the diagonal and the LAD lesion led to a very nice result was 0% stenosis in the LAD and a widely patent diagonal branch despite being jailed by the stent. TIMI grade 3 flow was noted. Final balloon diameter with postdilatation was 2.75 mm.  FFR performed on the progression of RCA distal lesion was noncritical with a value of 0.9.  LEFT VENTRICULOGRAM: Left ventricular angiogram was done in the 30 RAO projection and revealed overall normal function with EF of 70%   IMPRESSIONS: 1. Successful bifurcation stenting in the mid LAD with reduction in 90% LAD and 80% diagonal stenosis to 0% and 50% respectively. The diagonal branch was not stented. 2. Diffuse RCA disease with 50-70% distal narrowing and diffuse PDA involvement. 3. Diffuse proximal LAD disease with 30-50% narrowing from ostium  to mid vessel. 4. Moderate mid circumflex and first marginal diffuse disease but less than 50% obstructed. 5. Normal LV function   RECOMMENDATION: Dual antiplatelet therapy with Plavix. Check P2Y12, and switched to Brilinta if P2Y12 is not therapeutic in a.m. Decreased intensity of medication now that the LAD lesion is been treated Aggressive risk  factor modification Home in a.m.     Laboratory Data:  High Sensitivity Troponin:   Recent Labs  Lab 11/18/19 0821 11/18/19 1038  TROPONINIHS 4 6      Chemistry Recent Labs  Lab 11/18/19 0821  NA 142  K 4.0  CL 107  CO2 25  GLUCOSE 134*  BUN 16  CREATININE 0.82  CALCIUM 9.8  GFRNONAA >60  ANIONGAP 10    No results for input(s): PROT, ALBUMIN, AST, ALT, ALKPHOS, BILITOT in the last 168 hours. Hematology Recent Labs  Lab 11/18/19 0821  WBC 6.3  RBC 4.77  HGB 14.6  HCT 45.0  MCV 94.3  MCH 30.6  MCHC 32.4  RDW 13.0  PLT 182   BNPNo results for input(s): BNP, PROBNP in the last 168 hours.  DDimer No results for input(s): DDIMER in the last 168 hours.   Radiology/Studies:  DG Chest 2 View  Result Date: 11/18/2019 CLINICAL DATA:  Chest pain and shortness of breath EXAM: CHEST - 2 VIEW COMPARISON:  Jul 03, 2013 FINDINGS: There is mild atelectatic change in the left base. The lungs elsewhere are clear. The heart size and pulmonary vascularity are normal. No adenopathy. No pneumothorax. No bone lesions. IMPRESSION: Mild left base atelectasis. Lungs elsewhere clear. Heart size normal. No adenopathy. Electronically Signed   By: Lowella Grip III M.D.   On: 11/18/2019 08:45     Assessment and Plan:   Chest pain/CAD s/p DES LAD in 2015 - presents with CP waking her up from sleep, pressure like with associated sob, nausea and dry heaving minimally relieved with NTG - HS troponin 4>6 and EKG nonischemic - started on Iv heparin - last cath in 2015 showed diffuse RCA disease 50-70& with diffuse PDA involvement, diffuse pLAD disease 30-50%, mod mid circumflex an d1st marginal diffuse disease but less than 50% obstructed, and 90% LAD-diag bifurcation treated with DES x1. LVEF was normal - Myoview in 2019 showed EF 65%, with a medium defect of moderate severity, but overall low risk study.  - continue home aspirin, BB, rosuvastatin and zetia - check echo - NPO for at  midnight for possible cath. Would favor cath given known residual CAD and RF. Will discuss with MD.  Risks and benefits of cardiac catheterization have been discussed with the patient.  These include bleeding, infection, kidney damage, stroke, heart attack, death.  The patient understands these risks and is willing to proceed.  HTN - PTA metoprolol 12.5mg  daily, Losartan 100mg  daily - pressures stable  HLD - PTA zetia and rosuvastatin - check FLP  NSVT - short runs on tele - continue BB  DM2 - Diet controlled - check A1C  Hypothyroidism - continue synthroid - check TSH   The appropriate patient status for this patient is OBSERVATION. Observation status is judged to be reasonable and necessary in order to provide the required intensity of service to ensure the patient's safety. The patient's presenting symptoms, physical exam findings, and initial radiographic and laboratory data in the context of their medical condition is felt to place them at decreased risk for further clinical deterioration. Furthermore, it is anticipated that the patient will be medically stable  for discharge from the hospital within 2 midnights of admission. The following factors support the patient status of observation.   " The patient's presenting symptoms include chest pain. " The physical exam findings include chest pain. " The initial radiographic and laboratory data are nonspecific EKG changes.     For questions or updates, please contact [place] Please consult [email or web address] for contact info under     Signed, Kary Colaizzi Ninfa Meeker, PA-C  11/18/2019 1:29 PM

## 2019-11-19 ENCOUNTER — Encounter (HOSPITAL_COMMUNITY): Payer: Self-pay | Admitting: Cardiology

## 2019-11-19 ENCOUNTER — Telehealth: Payer: Self-pay | Admitting: Cardiology

## 2019-11-19 ENCOUNTER — Ambulatory Visit (HOSPITAL_COMMUNITY)
Admission: EM | Disposition: A | Discharge status: Home / Self Care | Payer: Self-pay | Source: Home / Self Care | Attending: Emergency Medicine

## 2019-11-19 DIAGNOSIS — K219 Gastro-esophageal reflux disease without esophagitis: Secondary | ICD-10-CM

## 2019-11-19 DIAGNOSIS — R072 Precordial pain: Secondary | ICD-10-CM

## 2019-11-19 DIAGNOSIS — I2510 Atherosclerotic heart disease of native coronary artery without angina pectoris: Secondary | ICD-10-CM | POA: Diagnosis not present

## 2019-11-19 HISTORY — PX: LEFT HEART CATH AND CORONARY ANGIOGRAPHY: CATH118249

## 2019-11-19 LAB — GLUCOSE, CAPILLARY: Glucose-Capillary: 93 mg/dL (ref 70–99)

## 2019-11-19 LAB — BASIC METABOLIC PANEL
Anion gap: 7 (ref 5–15)
BUN: 17 mg/dL (ref 8–23)
CO2: 24 mmol/L (ref 22–32)
Calcium: 8.8 mg/dL — ABNORMAL LOW (ref 8.9–10.3)
Chloride: 108 mmol/L (ref 98–111)
Creatinine, Ser: 0.8 mg/dL (ref 0.44–1.00)
GFR, Estimated: >60 mL/min (ref >60)
Glucose, Bld: 99 mg/dL (ref 70–99)
Potassium: 3.8 mmol/L (ref 3.5–5.1)
Sodium: 139 mmol/L (ref 135–145)

## 2019-11-19 LAB — LIPID PANEL
Cholesterol: 100 mg/dL (ref 0–200)
HDL: 42 mg/dL (ref >40)
LDL Cholesterol: 38 mg/dL (ref 0–99)
Total CHOL/HDL Ratio: 2.4 RATIO
Triglycerides: 100 mg/dL (ref <150)
VLDL: 20 mg/dL (ref 0–40)

## 2019-11-19 LAB — CBC
HCT: 40.5 % (ref 36.0–46.0)
Hemoglobin: 13.3 g/dL (ref 12.0–15.0)
MCH: 30.6 pg (ref 26.0–34.0)
MCHC: 32.8 g/dL (ref 30.0–36.0)
MCV: 93.1 fL (ref 80.0–100.0)
Platelets: 161 10*3/uL (ref 150–400)
RBC: 4.35 MIL/uL (ref 3.87–5.11)
RDW: 13.2 % (ref 11.5–15.5)
WBC: 4.7 10*3/uL (ref 4.0–10.5)
nRBC: 0 % (ref 0.0–0.2)

## 2019-11-19 LAB — HEPARIN LEVEL (UNFRACTIONATED): Heparin Unfractionated: 0.7 IU/mL (ref 0.30–0.70)

## 2019-11-19 SURGERY — LEFT HEART CATH AND CORONARY ANGIOGRAPHY
Anesthesia: LOCAL

## 2019-11-19 MED ORDER — ASPIRIN 81 MG PO CHEW: 81.0000 mg | CHEWABLE_TABLET | ORAL | Status: DC

## 2019-11-19 MED ORDER — HEPARIN (PORCINE) IN NACL 1000-0.9 UT/500ML-% IV SOLN
INTRAVENOUS | Status: AC
  Filled 2019-11-19: qty 1000

## 2019-11-19 MED ORDER — SODIUM CHLORIDE 0.9% FLUSH: 3.0000 mL | INTRAVENOUS | Status: DC | PRN

## 2019-11-19 MED ORDER — SODIUM CHLORIDE 0.9 % WEIGHT BASED INFUSION
1.0000 mL/kg/h | INTRAVENOUS | Status: DC
  Administered 2019-11-19: 1 mL/kg/h via INTRAVENOUS

## 2019-11-19 MED ORDER — MIDAZOLAM HCL 2 MG/2ML IJ SOLN
INTRAMUSCULAR | Status: AC
  Filled 2019-11-19: qty 2

## 2019-11-19 MED ORDER — LABETALOL HCL 5 MG/ML IV SOLN: 10.0000 mg | INTRAVENOUS | Status: DC | PRN

## 2019-11-19 MED ORDER — ONDANSETRON HCL 4 MG/2ML IJ SOLN
INTRAMUSCULAR | Status: AC
  Filled 2019-11-19: qty 2

## 2019-11-19 MED ORDER — SODIUM CHLORIDE 0.9% FLUSH: 3.0000 mL | Freq: Two times a day (BID) | INTRAVENOUS | Status: DC

## 2019-11-19 MED ORDER — SODIUM CHLORIDE 0.9 % WEIGHT BASED INFUSION: 3.0000 mL/kg/h | INTRAVENOUS | Status: DC

## 2019-11-19 MED ORDER — NITROGLYCERIN 2 % TD OINT
0.5000 [in_us] | TOPICAL_OINTMENT | Freq: Four times a day (QID) | TRANSDERMAL | Status: DC
  Administered 2019-11-19: 0.5 [in_us] via TOPICAL
  Filled 2019-11-19: qty 1

## 2019-11-19 MED ORDER — FENTANYL CITRATE (PF) 100 MCG/2ML IJ SOLN
INTRAMUSCULAR | Status: AC
  Filled 2019-11-19: qty 2

## 2019-11-19 MED ORDER — LIDOCAINE HCL (PF) 1 % IJ SOLN
INTRAMUSCULAR | Status: AC
  Filled 2019-11-19: qty 30

## 2019-11-19 MED ORDER — VERAPAMIL HCL 2.5 MG/ML IV SOLN
INTRAVENOUS | Status: AC
  Filled 2019-11-19: qty 2

## 2019-11-19 MED ORDER — HYDRALAZINE HCL 20 MG/ML IJ SOLN: 10.0000 mg | INTRAMUSCULAR | Status: DC | PRN

## 2019-11-19 MED ORDER — ENOXAPARIN SODIUM 40 MG/0.4ML ~~LOC~~ SOLN: 40.0000 mg | SUBCUTANEOUS | Status: DC

## 2019-11-19 MED ORDER — FAMOTIDINE 20 MG PO TABS
20.0000 mg | ORAL_TABLET | Freq: Every day | ORAL | Status: DC
  Administered 2019-11-19: 20 mg via ORAL
  Filled 2019-11-19: qty 1

## 2019-11-19 MED ORDER — SODIUM CHLORIDE 0.9 % IV SOLN: 250.0000 mL | INTRAVENOUS | Status: DC | PRN

## 2019-11-19 MED ORDER — SODIUM CHLORIDE 0.9 % WEIGHT BASED INFUSION: 1.0000 mL/kg/h | INTRAVENOUS | Status: DC

## 2019-11-19 MED ORDER — HEPARIN (PORCINE) IN NACL 1000-0.9 UT/500ML-% IV SOLN
INTRAVENOUS | Status: DC | PRN
  Administered 2019-11-19 (×2): 500 mL

## 2019-11-19 MED ORDER — LIDOCAINE HCL (PF) 1 % IJ SOLN
INTRAMUSCULAR | Status: DC | PRN
  Administered 2019-11-19: 2 mL via INTRADERMAL

## 2019-11-19 MED ORDER — ASPIRIN 81 MG PO TBEC: 81.0000 mg | DELAYED_RELEASE_TABLET | Freq: Every day | ORAL | 3 refills | Status: AC

## 2019-11-19 MED ORDER — FENTANYL CITRATE (PF) 100 MCG/2ML IJ SOLN
INTRAMUSCULAR | Status: DC | PRN
Start: 2019-11-19 — End: 2019-11-19
  Administered 2019-11-19 (×2): 25 ug via INTRAVENOUS

## 2019-11-19 MED ORDER — VERAPAMIL HCL 2.5 MG/ML IV SOLN
INTRAVENOUS | Status: DC | PRN
  Administered 2019-11-19 (×2): 10 mL via INTRA_ARTERIAL

## 2019-11-19 MED ORDER — HEPARIN SODIUM (PORCINE) 1000 UNIT/ML IJ SOLN
INTRAMUSCULAR | Status: AC
  Filled 2019-11-19: qty 1

## 2019-11-19 MED ORDER — MIDAZOLAM HCL 2 MG/2ML IJ SOLN
INTRAMUSCULAR | Status: DC | PRN
  Administered 2019-11-19 (×3): 1 mg via INTRAVENOUS

## 2019-11-19 MED ORDER — ASPIRIN 81 MG PO CHEW
81.0000 mg | CHEWABLE_TABLET | ORAL | Status: AC
  Administered 2019-11-19: 81 mg via ORAL

## 2019-11-19 MED ORDER — ONDANSETRON HCL 4 MG/2ML IJ SOLN
INTRAMUSCULAR | Status: DC | PRN
  Administered 2019-11-19: 4 mg via INTRAVENOUS

## 2019-11-19 SURGICAL SUPPLY — 13 items
CATH 5FR JL3.5 JR4 ANG PIG MP (CATHETERS) ×1 IMPLANT
CATH INFINITI 4FR JL3.5 (CATHETERS) ×1 IMPLANT
DEVICE RAD COMP TR BAND LRG (VASCULAR PRODUCTS) ×1 IMPLANT
GLIDESHEATH SLEND SS 6F .021 (SHEATH) ×1 IMPLANT
GUIDEWIRE INQWIRE 1.5J.035X260 (WIRE) IMPLANT
INQWIRE 1.5J .035X260CM (WIRE) ×2
KIT HEART LEFT (KITS) ×2 IMPLANT
PACK CARDIAC CATHETERIZATION (CUSTOM PROCEDURE TRAY) ×2 IMPLANT
SHEATH PROBE COVER 6X72 (BAG) ×1 IMPLANT
SYR MEDRAD MARK 7 150ML (SYRINGE) ×2 IMPLANT
TRANSDUCER W/STOPCOCK (MISCELLANEOUS) ×2 IMPLANT
TUBING CIL FLEX 10 FLL-RA (TUBING) ×2 IMPLANT
WIRE HI TORQ VERSACORE-J 145CM (WIRE) ×1 IMPLANT

## 2019-11-19 NOTE — Progress Notes (Signed)
ANTICOAGULATION CONSULT NOTE - Follow Up Consult  Pharmacy Consult for Heparin Indication: chest pain/ACS  Allergies  Allergen Reactions  . Ace Inhibitors Cough  . Prilosec [Omeprazole Magnesium] Other (See Comments)    Blisters and skin peels off  . Sulfa Antibiotics Other (See Comments)    Blisters and skin peels off  . Ultram [Tramadol Hcl]     Lapse of memory      Patient Measurements: Height: 5\' 4"  (162.6 cm) Weight: 75 kg (165 lb 5.5 oz) IBW/kg (Calculated) : 54.7 Heparin Dosing Weight: 71 kg   Vital Signs: Temp: 98.4 F (36.9 C) (10/13 0520) Temp Source: Oral (10/13 0520) BP: 122/72 (10/13 0520) Pulse Rate: 66 (10/13 0520)  Labs: Recent Labs    11/18/19 0821 11/18/19 1038 11/18/19 1710 11/19/19 0258  HGB 14.6  --   --  13.3  HCT 45.0  --   --  40.5  PLT 182  --   --  161  LABPROT  --  12.3  --   --   INR  --  1.0  --   --   HEPARINUNFRC  --   --  0.99* 0.70  CREATININE 0.82  --   --  0.80  TROPONINIHS 4 6  --   --     Estimated Creatinine Clearance: 67.7 mL/min (by C-G formula based on SCr of 0.8 mg/dL).   Medical History: Past Medical History:  Diagnosis Date  . Angina, class IV ([place]) 01/2014  . Anxiety    "menopausal related", history of  . CAD S/P mLAD Bifurcation PCI: Resolute DES 2.25 mm x 18 mm(2.75 mm), 2.0 mm Angiosculpt of Diag  01/08/2014   [place] 0,1,1 midLAD-Diag lesion - progressed from Moderate to Severe 90% LAD, 70-80% Diag from 6-12 2015 --> Class IV Angina --> DES PCI with Angiosculpt PCI of Diag  FFR of m-dRCA 50-70% = 0.9   . Complication of anesthesia    extreme sore throat and hoarseness after aneathesia, also "woke up screamimg" after surgery once"  . Coronary artery disease involving native coronary artery with angina pectoris with documented spasm ([place]) 06/2013   h/o moderate 3 Vessel CAD with mLAD-Diag bifurcation ([place] 0,1,1)  . Diabetes mellitus without complication ([place])   . Essential hypertension    stress test 10 yrs  ago.   pcp   dr Lennette Bihari little  . GERD (gastroesophageal reflux disease)   . Headache    Migraines childhood  . History of migraine headaches    "as a child"  . Hyperlipemia   . Hyperthyroidism    "had radioactive iodine tx in the 1980's"  . Hypothyroidism   . Osteoarthritis    "joints ache"  . Osteopenia     Assessment: 69 yr old female with hx of CAD (S/P stent placement in 2015) presented with chest pain concerning for ACS. Pharmacy was consulted to start IV heparin; pt was on no anticoagulation PTA.  Heparin came back therapeutic early this AM. We will get a confirm level today if not cath by then. CBC remains stable.   Cardiac cath planned for tomorrow, 11/19/19.  Goal of Therapy:  Heparin level 0.3-0.7 units/ml Monitor platelets by anticoagulation protocol: Yes   Plan:  Cont heparin infusion 750 units/hr Check 6-hr heparin confirm level or cath Monitor daily heparin level, CBC   Onnie Boer, PharmD, BCIDP, AAHIVP, CPP Infectious Disease Pharmacist 11/19/2019 8:35 AM

## 2019-11-19 NOTE — Discharge Summary (Signed)
Discharge Summary    Patient ID: Veronica [name] MRN: [id number]; DOB: 1950/05/27  Admit date: 11/18/2019 Discharge date: 11/19/2019  Primary Care Provider: Hulan Fess, MD  Primary Cardiologist: Sinclair Grooms, MD  Primary Electrophysiologist:  None   Discharge Diagnoses    Principal Problem:   Unstable angina [name] Active Problems:   Essential hypertension   Hyperlipidemia   CAD S/P mLAD Bifurcation PCI: Resolute DES 2.25 mm x 18 mm(2.75 mm), 2.0 mm Angiosculpt of Diag    Chest pain   Diagnostic Studies/Procedures    Left heart cath 11/19/19:  Prox LAD to Mid LAD lesion is 25% stenosed.  Previously placed Mid LAD stent (unknown type) is widely patent.  Ost Cx to Prox Cx lesion is 30% stenosed.  1st Mrg lesion is 25% stenosed.  Mid Cx to Dist Cx lesion is 25% stenosed.  LV end diastolic pressure is normal.   1. Nonobstructive CAD. Prior stent in the LAD is patent 2. Normal LVEDP.  Plan: consider other causes for her symptoms. Continue risk factor modification.   _____________   Echo 11/19/19: 1. Left ventricular ejection fraction, by estimation, is 60 to 65%. The  left ventricle has normal function. The left ventricle has no regional  wall motion abnormalities. Left ventricular diastolic parameters are  consistent with Grade I diastolic  dysfunction (impaired relaxation).  2. Right ventricular systolic function is normal. The right ventricular  size is normal.  3. Left atrial size was mildly dilated.  4. The mitral valve is normal in structure. No evidence of mitral valve  regurgitation. No evidence of mitral stenosis.  5. The aortic valve is normal in structure. Aortic valve regurgitation is  not visualized. No aortic stenosis is present.  6. The inferior vena cava is normal in size with greater than 50%  respiratory variability, suggesting right atrial pressure of 3 mmHg.   History of Present Illness     Veronica [name] is a 69 y.o. female  with CAD s/p LAD PCI, moderate-severe RCA/LCX disease, diabetes, hypertension, hyperlipidemia admitted with chest pain.    [name] is followed by Dr. Tamala Julian for the above cardiac issues. She had stenting to the mid to distal LAD in 2015. FFR of the RCA (50-70%) was performed which was a non-critical value of 0.9. LV function was normal. It also showed diffuse pLAD disease 30-35% and moderate mid circumflex and first marginal diffuse disease, less than 50% obstructed. Patient had a Myoview stress test 02/2017 showing EF 65%, with a medium defect of moderate severity, but overall low risk study. Patient was last seen 04/03/2019 and was symptomatically stable. Reported rare NTG use.   The patient presents to the ED 11/18/19 for chest pain. Pain woke her up this morning at 6AM. It was left-sided and radiating into her left shoulder, up her neck, and into her back. It was pressure like in nature. Up to 5/10. She felt mild sob with nausea, dry heaving and diaphoresis. After about 10 minutes she took SL [facility] which minimally improved the pain. She ultimately took another [employer] which did not seem to help the pain. She took full dose aspirin. Says pain was similar to prior PCI in 2015 but different than her normal angina pain. EMS was called who brought her to the ED for further work-up. She denies recent fever, chills, LLE, orthopnea, and pain, lightheadedness. She is pretty active, walking every morning and biking at night. She has angina pretty regularly and takes SL Nitro  about once a month.   In the ED BP 101/82, pulse 76, temp 98.4. RR 12, 98% O2. Lab results showed glucse 134, creatinine 0.82, WBC 6.3, Hgb 14.6. HS troponin 4. COVID negative. EKG without any ischemic changes. CXR showed mild left base atelectasis. Cardiology was asked to admit.   Troponin has been normal and EKG is unremarkable.  We discussed the options of repeat Lexiscan Myoview and cath.  She did not like her [facility] and  would like to proceed with cath tomorrow.  We will also get an echo.   Hospital Course     Consultants: none  Chest pain Pt with a history of CAD presented with chest pain suspicious for angina. Since she did not want to repeat a stress test, left heart cath was scheduled and completed today. LAD stent was patent with otherwise minimal and mild disease. Suspect her chest pain may be due to GERD. Will start famotidine 20 mg daily, as she has a PPI allergy.    Hypertension Continue losartan and lopressor.   Hyperlipidemia with LDL goal < 70 11/19/2019: Cholesterol 100; HDL 42; LDL Cholesterol 38; Triglycerides 100; VLDL 20 Continue crestor and zetia   NSVT Brief episodes on telemetry. Continue BB   DM2 Diet controlled. A1c 6.0%.   Pt seen and examined by Dr. Oval Linsey and deemed stable for discharge.     Did the patient have an acute coronary syndrome (MI, NSTEMI, STEMI, etc) this admission?:  No                               Did the patient have a percutaneous coronary intervention (stent / angioplasty)?:  No.   _____________  Discharge Vitals Blood pressure 117/70, pulse 72, temperature 98.4 F (36.9 C), temperature source Oral, resp. rate 14, height 5\' 4"  (1.626 m), weight 75 kg, last menstrual period 02/06/2010, SpO2 98 %.  Filed Weights   11/18/19 0819 11/19/19 0520  Weight: 77.1 kg 75 kg    Labs & Radiologic Studies    CBC Recent Labs    11/18/19 0821 11/19/19 0258  WBC 6.3 4.7  HGB 14.6 13.3  HCT 45.0 40.5  MCV 94.3 93.1  PLT 182 573   Basic Metabolic Panel Recent Labs    11/18/19 0821 11/18/19 1710 11/19/19 0258  NA 142  --  139  K 4.0  --  3.8  CL 107  --  108  CO2 25  --  24  GLUCOSE 134*  --  99  BUN 16  --  17  CREATININE 0.82  --  0.80  CALCIUM 9.8  --  8.8*  MG  --  2.2  --    Liver Function Tests No results for input(s): AST, ALT, ALKPHOS, BILITOT, PROT, ALBUMIN in the last 72 hours. No results for input(s): LIPASE, AMYLASE in the  last 72 hours. High Sensitivity Troponin:   Recent Labs  Lab 11/18/19 0821 11/18/19 1038  TROPONINIHS 4 6    BNP Invalid input(s): POCBNP D-Dimer No results for input(s): DDIMER in the last 72 hours. Hemoglobin A1C Recent Labs    11/18/19 1710  HGBA1C 6.0*   Fasting Lipid Panel Recent Labs    11/19/19 0258  CHOL 100  HDL 42  LDLCALC 38  TRIG 100  CHOLHDL 2.4   Thyroid Function Tests Recent Labs    11/18/19 1710  TSH 6.387*   _____________  DG Chest 2 View  Result  Date: 11/18/2019 CLINICAL DATA:  Chest pain and shortness of breath EXAM: CHEST - 2 VIEW COMPARISON:  Jul 03, 2013 FINDINGS: There is mild atelectatic change in the left base. The lungs elsewhere are clear. The heart size and pulmonary vascularity are normal. No adenopathy. No pneumothorax. No bone lesions. IMPRESSION: Mild left base atelectasis. Lungs elsewhere clear. Heart size normal. No adenopathy. Electronically Signed   By: Lowella Grip III M.D.   On: 11/18/2019 08:45   CARDIAC CATHETERIZATION  Result Date: 11/19/2019  Prox LAD to Mid LAD lesion is 25% stenosed.  Previously placed Mid LAD stent (unknown type) is widely patent.  Ost Cx to Prox Cx lesion is 30% stenosed.  1st Mrg lesion is 25% stenosed.  Mid Cx to Dist Cx lesion is 25% stenosed.  LV end diastolic pressure is normal.  1. Nonobstructive CAD. Prior stent in the LAD is patent 2. Normal LVEDP. Plan: consider other causes for her symptoms. Continue risk factor modification.   ECHOCARDIOGRAM COMPLETE  Result Date: 11/18/2019    ECHOCARDIOGRAM REPORT   Patient Name:   LATRAVIA SOUTHGATE Kamau Date of Exam: 11/18/2019 Medical Rec #:  [id number]     Height:       64.0 in Accession #:    [id number]    Weight:       170.0 lb Date of Birth:  1950/09/11     BSA:          1.826 m Patient Age:    69 years      BP:           145/67 mmHg Patient Gender: F             HR:           63 bpm. Exam Location:  Inpatient Procedure: 2D Echo, Cardiac Doppler and  Color Doppler Indications:    Chest pain  History:        Patient has no prior history of Echocardiogram examinations.                 Angina and CAD, Signs/Symptoms:Chest Pain; Risk                 Factors:Hypertension, Dyslipidemia and Diabetes.  Sonographer:    Clayton Lefort RDCS (AE) Referring Phys: [id number] CADENCE H FURTH  Sonographer Comments: Suboptimal subcostal window. IMPRESSIONS  1. Left ventricular ejection fraction, by estimation, is 60 to 65%. The left ventricle has normal function. The left ventricle has no regional wall motion abnormalities. Left ventricular diastolic parameters are consistent with Grade I diastolic dysfunction (impaired relaxation).  2. Right ventricular systolic function is normal. The right ventricular size is normal.  3. Left atrial size was mildly dilated.  4. The mitral valve is normal in structure. No evidence of mitral valve regurgitation. No evidence of mitral stenosis.  5. The aortic valve is normal in structure. Aortic valve regurgitation is not visualized. No aortic stenosis is present.  6. The inferior vena cava is normal in size with greater than 50% respiratory variability, suggesting right atrial pressure of 3 mmHg. FINDINGS  Left Ventricle: Left ventricular ejection fraction, by estimation, is 60 to 65%. The left ventricle has normal function. The left ventricle has no regional wall motion abnormalities. The left ventricular internal cavity size was normal in size. There is  no left ventricular hypertrophy. Left ventricular diastolic parameters are consistent with Grade I diastolic dysfunction (impaired relaxation). Right Ventricle: The right ventricular size is normal. No increase in right ventricular  wall thickness. Right ventricular systolic function is normal. Left Atrium: Left atrial size was mildly dilated. Right Atrium: Right atrial size was normal in size. Pericardium: There is no evidence of pericardial effusion. Mitral Valve: The mitral valve is normal in  structure. No evidence of mitral valve regurgitation. No evidence of mitral valve stenosis. MV peak gradient, 3.9 mmHg. The mean mitral valve gradient is 1.0 mmHg. Tricuspid Valve: The tricuspid valve is normal in structure. Tricuspid valve regurgitation is not demonstrated. No evidence of tricuspid stenosis. Aortic Valve: The aortic valve is normal in structure. Aortic valve regurgitation is not visualized. No aortic stenosis is present. Aortic valve mean gradient measures 3.0 mmHg. Aortic valve peak gradient measures 5.4 mmHg. Aortic valve area, by VTI measures 2.21 cm. Pulmonic Valve: The pulmonic valve was normal in structure. Pulmonic valve regurgitation is not visualized. No evidence of pulmonic stenosis. Aorta: The aortic root is normal in size and structure. Venous: The inferior vena cava is normal in size with greater than 50% respiratory variability, suggesting right atrial pressure of 3 mmHg. IAS/Shunts: No atrial level shunt detected by color flow Doppler.  LEFT VENTRICLE PLAX 2D LVIDd:         3.60 cm  Diastology LVIDs:         2.30 cm  LV e' medial:    6.64 cm/s LV PW:         1.20 cm  LV E/e' medial:  11.0 LV IVS:        1.30 cm  LV e' lateral:   7.94 cm/s LVOT diam:     1.80 cm  LV E/e' lateral: 9.2 LV SV:         53 LV SV Index:   29 LVOT Area:     2.54 cm  RIGHT VENTRICLE RV Basal diam:  3.20 cm RV S prime:     10.40 cm/s TAPSE (M-mode): 2.4 cm LEFT ATRIUM             Index       RIGHT ATRIUM           Index LA diam:        3.10 cm 1.70 cm/m  RA Area:     16.20 cm LA Vol (A2C):   32.3 ml 17.69 ml/m RA Volume:   40.40 ml  22.13 ml/m LA Vol (A4C):   53.8 ml 29.47 ml/m LA Biplane Vol: 41.8 ml 22.89 ml/m  AORTIC VALVE AV Area (Vmax):    1.98 cm AV Area (Vmean):   1.99 cm AV Area (VTI):     2.21 cm AV Vmax:           116.00 cm/s AV Vmean:          81.300 cm/s AV VTI:            0.242 m AV Peak Grad:      5.4 mmHg AV Mean Grad:      3.0 mmHg LVOT Vmax:         90.10 cm/s LVOT Vmean:         63.700 cm/s LVOT VTI:          0.210 m LVOT/AV VTI ratio: 0.87  AORTA Ao Root diam: 3.10 cm Ao Asc diam:  2.80 cm MITRAL VALVE MV Area (PHT): 3.53 cm    SHUNTS MV Peak grad:  3.9 mmHg    Systemic VTI:  0.21 m MV Mean grad:  1.0 mmHg    Systemic Diam: 1.80 cm MV Vmax:  0.99 m/s MV Vmean:      47.5 cm/s MV Decel Time: 215 msec MV E velocity: 72.80 cm/s MV A velocity: 91.30 cm/s MV E/A ratio:  0.80 Mihai Croitoru MD Electronically signed by Sanda Klein MD Signature Date/Time: 11/18/2019/7:34:04 PM    Final    Disposition   Pt is being discharged home today in good condition.  Follow-up Plans & Appointments     Discharge Instructions    Diet - low sodium heart healthy   Complete by: As directed    Discharge instructions   Complete by: As directed    No driving for 2 days. No lifting over 5 lbs for 1 week. No sexual activity for 1 week.  Keep procedure site clean & dry. If you notice increased pain, swelling, bleeding or pus, call/return!  You may shower, but no soaking baths/hot tubs/pools for 1 week.   Increase activity slowly   Complete by: As directed       Discharge Medications   Allergies as of 11/19/2019      Reactions   Ace Inhibitors Cough   Prilosec [omeprazole Magnesium] Other (See Comments)   Blisters and skin peels off   Sulfa Antibiotics Other (See Comments)   Blisters and skin peels off   Ultram [tramadol Hcl]    Lapse of memory        Medication List    STOP taking these medications   aspirin 81 MG chewable tablet Replaced by: aspirin 81 MG EC tablet     TAKE these medications   aspirin 81 MG EC tablet Take 1 tablet (81 mg total) by mouth daily. Swallow whole. Replaces: aspirin 81 MG chewable tablet   augmented betamethasone dipropionate 0.05 % cream Commonly known as: DIPROLENE-AF Apply 1 application topically daily as needed (for skin irritation).   cyclobenzaprine 10 MG tablet Commonly known as: FLEXERIL Take 10 mg by mouth 3 (three) times  daily as needed for muscle spasms.   diphenhydrAMINE 25 mg capsule Commonly known as: BENADRYL Take 25 mg by mouth every 6 (six) hours as needed for sleep.   estradiol 0.1 MG/GM vaginal cream Commonly known as: ESTRACE Place 1 Applicatorful vaginally daily as needed. 1 gram vaginally daily prn What changed:   when to take this  additional instructions   ezetimibe 10 MG tablet Commonly known as: ZETIA TAKE 1 TABLET BY MOUTH EVERY DAY   famotidine 20 MG tablet Commonly known as: PEPCID Take 20 mg by mouth 2 (two) times daily as needed for heartburn or indigestion.   fish oil-omega-3 fatty acids 1000 MG capsule Take 1 g by mouth daily.   halobetasol 0.05 % cream Commonly known as: ULTRAVATE Apply 1 application topically daily as needed (for skin irritation).   hydrocortisone valerate cream 0.2 % Commonly known as: WESTCORT Apply 1 application topically 2 (two) times daily as needed (rash).   levothyroxine 88 MCG tablet Commonly known as: SYNTHROID Take 100 mcg by mouth daily before breakfast.   losartan 100 MG tablet Commonly known as: COZAAR Take 100 mg by mouth daily.   metoprolol tartrate 25 MG tablet Commonly known as: LOPRESSOR Take 1/2 (one-half) tablet by mouth once daily What changed: See the new instructions.   nitroGLYCERIN 0.4 MG SL tablet Commonly known as: NITROSTAT PLACE 1 TABLET (0.4 MG TOTAL) UNDER THE TONGUE EVERY 5 (FIVE) MINUTES AS NEEDED FOR CHEST PAIN.   NONFORMULARY OR COMPOUNDED ITEM [facility]:  Antifungal topical - Terbinafine 3%, Fluconazole 2%, Tea Tree Oil 5%, Urea  10%, Ibuprofen 2%, in #75ml DMSO suspension. Apply to affected toenail(s) once daily (at bedtime) or twice daily   OneTouch Verio test strip Generic drug: glucose blood 1 each by Other route every other day.   PRESERVISION AREDS PO Take 1 capsule by mouth daily.   rosuvastatin 40 MG tablet Commonly known as: CRESTOR Take 40 mg by mouth daily.            Outstanding Labs/Studies   none  Duration of Discharge Encounter   Greater than 30 minutes including physician time.  Signed, Tami Lin Gifford Ballon, PA 11/19/2019, 2:52 PM

## 2019-11-19 NOTE — Progress Notes (Signed)
TR BAND REMOVAL  LOCATION:    Radial rt wrist arterial site  DEFLATED PER PROTOCOL:   yes  TIME BAND OFF / DRESSING APPLIED:    1340/gauze and tegaderm  SITE UPON ARRIVAL:    Level 0  SITE AFTER BAND REMOVAL:    Level 0  CIRCULATION SENSATION AND MOVEMENT:    Within Normal Limits : yes, rt wrist warm and pink, palpable rt radial pulse, sensation present  COMMENTS:

## 2019-11-19 NOTE — Interval H&P Note (Signed)
History and Physical Interval Note:  11/19/2019 10:12 AM  Veronica [name]  has presented today for surgery, with the diagnosis of chest pain.  The various methods of treatment have been discussed with the patient and family. After consideration of risks, benefits and other options for treatment, the patient has consented to  Procedure(s): LEFT HEART CATH AND CORONARY ANGIOGRAPHY (N/A) as a surgical intervention.  The patient's history has been reviewed, patient examined, no change in status, stable for surgery.  I have reviewed the patient's chart and labs.  Questions were answered to the patient's satisfaction.   Cath Lab Visit (complete for each Cath Lab visit)  Clinical Evaluation Leading to the Procedure:   ACS: Yes.    Non-ACS:    Anginal Classification: CCS IV  Anti-ischemic medical therapy: Maximal Therapy (2 or more classes of medications)  Non-Invasive Test Results: No non-invasive testing performed  Prior CABG: No previous CABG        Veronica [name] [facility] 11/19/2019 10:12 AM

## 2019-11-19 NOTE — Progress Notes (Signed)
Pt denies questions or concerns for heart cath commenting all questions have been addressed by md. This rn obtained written consent from pt, pt husband at bedside. Pt taken to cathlab by transporter,  heparin drip and ns infusing RPIV, oxygen 2l [place].

## 2019-11-19 NOTE — Telephone Encounter (Signed)
   Pt is scheduled for TOC with [name] on 11/28/2019 at 11:15 am. Scheduled by Fabian Sharp

## 2019-11-19 NOTE — Progress Notes (Signed)
Progress Note  Patient Name: Veronica [name] Date of Encounter: 11/19/2019  [facility] Cardiologist: Sinclair Grooms, MD   Subjective   She continues to have chest pain this morning, she would like to try nitro paste. All questions answered about heart cath.   Inpatient Medications    Scheduled Meds: . aspirin  324 mg Oral NOW   Or  . aspirin  300 mg Rectal NOW  . aspirin  81 mg Oral Daily  . aspirin EC  81 mg Oral Daily  . ezetimibe  10 mg Oral Daily  . levothyroxine  100 mcg Oral QAC breakfast  . losartan  100 mg Oral Daily  . metoprolol tartrate  12.5 mg Oral Daily  . rosuvastatin  40 mg Oral Daily  . sodium chloride flush  3 mL Intravenous Q12H  . sodium chloride flush  3 mL Intravenous Q12H   Continuous Infusions: . sodium chloride    . sodium chloride 1 mL/kg/hr (11/19/19 0629)  . heparin 750 Units/hr (11/18/19 1927)   PRN Meds: sodium chloride, acetaminophen, ALPRAZolam, cyclobenzaprine, famotidine, nitroGLYCERIN, ondansetron (ZOFRAN) IV, sodium chloride flush, zolpidem   Vital Signs    Vitals:   11/18/19 1815 11/18/19 1847 11/19/19 0141 11/19/19 0520  BP: 137/80 (!) 156/72 122/78 122/72  Pulse: 77 81 82 66  Resp: 17 20 18 18   Temp:  98.5 F (36.9 C) 98.1 F (36.7 C) 98.4 F (36.9 C)  TempSrc:  Oral Oral Oral  SpO2: 99% 100% 98% 99%  Weight:    75 kg  Height:        Intake/Output Summary (Last 24 hours) at 11/19/2019 0839 Last data filed at 11/19/2019 9509 Gross per 24 hour  Intake 193.68 ml  Output --  Net 193.68 ml   Last 3 Weights 11/19/2019 11/18/2019 04/03/2019  Weight (lbs) 165 lb 5.5 oz 170 lb 170 lb 12.8 oz  Weight (kg) 75 kg 77.111 kg 77.474 kg      Telemetry    Sinus rhythm with PVCs and brief episodes of NSVT - Personally Reviewed  ECG    No new tracings - Personally Reviewed  Physical Exam   GEN: No acute distress.   Neck: No JVD Cardiac: RRR, no murmurs, rubs, or gallops.  Respiratory: Clear to auscultation  bilaterally. GI: Soft, nontender, non-distended  MS: No edema; No deformity. Neuro:  Nonfocal  Psych: Normal affect   Labs    High Sensitivity Troponin:   Recent Labs  Lab 11/18/19 0821 11/18/19 1038  TROPONINIHS 4 6      Chemistry Recent Labs  Lab 11/18/19 0821 11/19/19 0258  NA 142 139  K 4.0 3.8  CL 107 108  CO2 25 24  GLUCOSE 134* 99  BUN 16 17  CREATININE 0.82 0.80  CALCIUM 9.8 8.8*  GFRNONAA >60 >60  ANIONGAP 10 7     Hematology Recent Labs  Lab 11/18/19 0821 11/19/19 0258  WBC 6.3 4.7  RBC 4.77 4.35  HGB 14.6 13.3  HCT 45.0 40.5  MCV 94.3 93.1  MCH 30.6 30.6  MCHC 32.4 32.8  RDW 13.0 13.2  PLT 182 161    BNPNo results for input(s): BNP, PROBNP in the last 168 hours.   DDimer No results for input(s): DDIMER in the last 168 hours.   Radiology    DG Chest 2 View  Result Date: 11/18/2019 CLINICAL DATA:  Chest pain and shortness of breath EXAM: CHEST - 2 VIEW COMPARISON:  Jul 03, 2013 FINDINGS: There  is mild atelectatic change in the left base. The lungs elsewhere are clear. The heart size and pulmonary vascularity are normal. No adenopathy. No pneumothorax. No bone lesions. IMPRESSION: Mild left base atelectasis. Lungs elsewhere clear. Heart size normal. No adenopathy. Electronically Signed   By: Lowella Grip III M.D.   On: 11/18/2019 08:45   ECHOCARDIOGRAM COMPLETE  Result Date: 11/18/2019    ECHOCARDIOGRAM REPORT   Patient Name:   Veronica [name] Date of Exam: 11/18/2019 Medical Rec #:  [id number]     Height:       64.0 in Accession #:    [id number]    Weight:       170.0 lb Date of Birth:  1951/01/18     BSA:          1.826 m Patient Age:    69 years      BP:           145/67 mmHg Patient Gender: F             HR:           63 bpm. Exam Location:  Inpatient Procedure: 2D Echo, Cardiac Doppler and Color Doppler Indications:    Chest pain  History:        Patient has no prior history of Echocardiogram examinations.                 Angina and CAD,  Signs/Symptoms:Chest Pain; Risk                 Factors:Hypertension, Dyslipidemia and Diabetes.  Sonographer:    Clayton Lefort RDCS (AE) Referring Phys: [id number] CADENCE H FURTH  Sonographer Comments: Suboptimal subcostal window. IMPRESSIONS  1. Left ventricular ejection fraction, by estimation, is 60 to 65%. The left ventricle has normal function. The left ventricle has no regional wall motion abnormalities. Left ventricular diastolic parameters are consistent with Grade I diastolic dysfunction (impaired relaxation).  2. Right ventricular systolic function is normal. The right ventricular size is normal.  3. Left atrial size was mildly dilated.  4. The mitral valve is normal in structure. No evidence of mitral valve regurgitation. No evidence of mitral stenosis.  5. The aortic valve is normal in structure. Aortic valve regurgitation is not visualized. No aortic stenosis is present.  6. The inferior vena cava is normal in size with greater than 50% respiratory variability, suggesting right atrial pressure of 3 mmHg. FINDINGS  Left Ventricle: Left ventricular ejection fraction, by estimation, is 60 to 65%. The left ventricle has normal function. The left ventricle has no regional wall motion abnormalities. The left ventricular internal cavity size was normal in size. There is  no left ventricular hypertrophy. Left ventricular diastolic parameters are consistent with Grade I diastolic dysfunction (impaired relaxation). Right Ventricle: The right ventricular size is normal. No increase in right ventricular wall thickness. Right ventricular systolic function is normal. Left Atrium: Left atrial size was mildly dilated. Right Atrium: Right atrial size was normal in size. Pericardium: There is no evidence of pericardial effusion. Mitral Valve: The mitral valve is normal in structure. No evidence of mitral valve regurgitation. No evidence of mitral valve stenosis. MV peak gradient, 3.9 mmHg. The mean mitral valve gradient is  1.0 mmHg. Tricuspid Valve: The tricuspid valve is normal in structure. Tricuspid valve regurgitation is not demonstrated. No evidence of tricuspid stenosis. Aortic Valve: The aortic valve is normal in structure. Aortic valve regurgitation is not visualized. No aortic stenosis is present. Aortic valve mean  gradient measures 3.0 mmHg. Aortic valve peak gradient measures 5.4 mmHg. Aortic valve area, by VTI measures 2.21 cm. Pulmonic Valve: The pulmonic valve was normal in structure. Pulmonic valve regurgitation is not visualized. No evidence of pulmonic stenosis. Aorta: The aortic root is normal in size and structure. Venous: The inferior vena cava is normal in size with greater than 50% respiratory variability, suggesting right atrial pressure of 3 mmHg. IAS/Shunts: No atrial level shunt detected by color flow Doppler.  LEFT VENTRICLE PLAX 2D LVIDd:         3.60 cm  Diastology LVIDs:         2.30 cm  LV e' medial:    6.64 cm/s LV PW:         1.20 cm  LV E/e' medial:  11.0 LV IVS:        1.30 cm  LV e' lateral:   7.94 cm/s LVOT diam:     1.80 cm  LV E/e' lateral: 9.2 LV SV:         53 LV SV Index:   29 LVOT Area:     2.54 cm  RIGHT VENTRICLE RV Basal diam:  3.20 cm RV S prime:     10.40 cm/s TAPSE (M-mode): 2.4 cm LEFT ATRIUM             Index       RIGHT ATRIUM           Index LA diam:        3.10 cm 1.70 cm/m  RA Area:     16.20 cm LA Vol (A2C):   32.3 ml 17.69 ml/m RA Volume:   40.40 ml  22.13 ml/m LA Vol (A4C):   53.8 ml 29.47 ml/m LA Biplane Vol: 41.8 ml 22.89 ml/m  AORTIC VALVE AV Area (Vmax):    1.98 cm AV Area (Vmean):   1.99 cm AV Area (VTI):     2.21 cm AV Vmax:           116.00 cm/s AV Vmean:          81.300 cm/s AV VTI:            0.242 m AV Peak Grad:      5.4 mmHg AV Mean Grad:      3.0 mmHg LVOT Vmax:         90.10 cm/s LVOT Vmean:        63.700 cm/s LVOT VTI:          0.210 m LVOT/AV VTI ratio: 0.87  AORTA Ao Root diam: 3.10 cm Ao Asc diam:  2.80 cm MITRAL VALVE MV Area (PHT): 3.53 cm     SHUNTS MV Peak grad:  3.9 mmHg    Systemic VTI:  0.21 m MV Mean grad:  1.0 mmHg    Systemic Diam: 1.80 cm MV Vmax:       0.99 m/s MV Vmean:      47.5 cm/s MV Decel Time: 215 msec MV E velocity: 72.80 cm/s MV A velocity: 91.30 cm/s MV E/A ratio:  0.80 Mihai Croitoru MD Electronically signed by Sanda Klein MD Signature Date/Time: 11/18/2019/7:34:04 PM    Final     Cardiac Studies   Left heart cath scheduled for today  Echo 11/18/19: 1. Left ventricular ejection fraction, by estimation, is 60 to 65%. The  left ventricle has normal function. The left ventricle has no regional  wall motion abnormalities. Left ventricular diastolic parameters are  consistent with Grade I diastolic  dysfunction (impaired relaxation).  2. Right ventricular systolic function is normal. The right ventricular  size is normal.  3. Left atrial size was mildly dilated.  4. The mitral valve is normal in structure. No evidence of mitral valve  regurgitation. No evidence of mitral stenosis.  5. The aortic valve is normal in structure. Aortic valve regurgitation is  not visualized. No aortic stenosis is present.  6. The inferior vena cava is normal in size with greater than 50%  respiratory variability, suggesting right atrial pressure of 3 mmHg.    Patient Profile     69 y.o. female with DM2, HLD, HTN, hypothyroidism, CAD with DES to LAD in 2015 who is being seen for chest pain  Assessment & Plan    Chest pain CAD s/p prior LAD stent (2015) - residual disease at last heart cath in 2015 - myoview Jan 2019 with perfusion defect, but overall low risk study - presented with symptoms concerning for unstable angina - hs troponin 4 --> 6 - continue heparin drip - plan for heart cath today given known residual disease, risk factors, and presentation - she continues to have chest pain this morning - will order nitro paste, she did not want to take a SL nitro   Hypertension - pressures controlled today -  continue losartan and lopressor   Hyperlipidemia with LDL goal < 70 11/19/2019: Cholesterol 100; HDL 42; LDL Cholesterol 38; Triglycerides 100; VLDL 20 - continue crestor and zetia   NSVT - on BB - brief episodes on telemetry   DM2 - diet controlled - A1c 6.0%     For questions or updates, please contact [facility] Please consult [email or web address] for contact info under        Signed, Ledora Bottcher, PA  11/19/2019, 8:39 AM

## 2019-11-20 ENCOUNTER — Other Ambulatory Visit: Payer: Self-pay | Admitting: Interventional Cardiology

## 2019-11-20 MED FILL — Heparin Sodium (Porcine) Inj 1000 Unit/ML: INTRAMUSCULAR | Qty: 10 | Status: AC

## 2019-11-20 NOTE — Telephone Encounter (Signed)
**Note De-Identified Veronica [name] Obfuscation** Patient contacted regarding discharge from [facility] on 11/19/2019.  Patient understands to follow up with provider [name], NP on 11/28/2019 at 11:15 at [address] [place], [place] [address] Patient understands discharge instructions? Yes Patient understands medications and regiment? Yes Patient understands to bring all medications to this visit? Yes  Ask patient:  Are you enrolled in My Chart:Yes   Pt states she is doing well today. She denies CP, SOB, nausea, diaphoresis, dizziness or headaches. She thanked me for my call.

## 2019-11-20 NOTE — Progress Notes (Signed)
Cardiology Office Note   Date:  11/28/2019   ID:  Veronica [name] [birth year], MRN [id number]  PCP:  Hulan Fess, MD  Cardiologist: Dr. Tamala Julian, MD   Chief Complaint  Patient presents with  . Hospitalization Follow-up    History of Present Illness: Veronica [name] is a 69 y.o. female who presents for TOC follow up, seen for Dr. Tamala Julian.   Veronica [name] is a 69 y.o. female with CAD s/p LAD PCI, DM, HTN, and HLD who was admitted with chest pain.   Veronica [name] followed by Dr. Tamala Julian for her cardiology care. She previously had stenting to the mid to distal LAD in 2015. FFR of the RCA (50-70%) was performed which was a non-critical value of 0.9. LV function was normal. LHC showed diffuse pLAD disease 30-35% and moderate mid circumflex and first marginal diffuse disease, less than 50% obstructed. Patient had a Myoview stress test 02/2017 showing EF 65%, with a medium defect of moderate severity, but overall low risk study. Patient was last seen 04/03/2019 and was symptomatically stable and reported rare NTG use.   On 11/18/19 she presented to the ED for chest pain which woke her from sleep described as left-sided and radiating into her left shoulder, up her neck, and into her back. She deferred stress testing therefore she underwent repeat LHC which showed patent LAD stent and otherwise minimal to mild disease. Chest pain was suspected to be secondary to GERD therefore she was started on famotidine 20 mg daily, as she has a PPI allergy.    Today she is seen and reports she continues to have some exertional chest pain with activity, more specifically with walking up hills. She has taken SL NTG which helped. We discussed her cath results today however given her persistent pain, we discussed adding long acting nitrates to her regimen. Otherwise she has no specific complaints.   Past Medical History:  Diagnosis Date  . Angina, class IV ([place]) 01/2014  . Anxiety    "menopausal related", history of  .  CAD S/P mLAD Bifurcation PCI: Resolute DES 2.25 mm x 18 mm(2.75 mm), 2.0 mm Angiosculpt of Diag  01/08/2014   [place] 0,1,1 midLAD-Diag lesion - progressed from Moderate to Severe 90% LAD, 70-80% Diag from 6-12 2015 --> Class IV Angina --> DES PCI with Angiosculpt PCI of Diag  FFR of m-dRCA 50-70% = 0.9   . Complication of anesthesia    extreme sore throat and hoarseness after aneathesia, also "woke up screamimg" after surgery once"  . Coronary artery disease involving native coronary artery with angina pectoris with documented spasm ([place]) 06/2013   h/o moderate 3 Vessel CAD with mLAD-Diag bifurcation ([place] 0,1,1)  . Diabetes mellitus without complication ([place])   . Essential hypertension    stress test 10 yrs ago.   pcp   dr Lennette Bihari little  . GERD (gastroesophageal reflux disease)   . Headache    Migraines childhood  . History of migraine headaches    "as a child"  . Hyperlipemia   . Hyperthyroidism    "had radioactive iodine tx in the 1980's"  . Hypothyroidism   . Osteoarthritis    "joints ache"  . Osteopenia     Past Surgical History:  Procedure Laterality Date  . BACK SURGERY    . BREAST CYST ASPIRATION Left 04/1999   "done in dr's office"  . CARDIAC CATHETERIZATION  06/2013  . CARDIAC CATHETERIZATION  01/08/2014   Procedure: INTRAVASCULAR PRESSURE WIRE/FFR STUDY;  Surgeon:  Sinclair Grooms, MD;  Location: [facility] CATH LAB;  Service: Cardiovascular;;  distal RCA  . CARDIAC CATHETERIZATION  01/08/2014   Procedure: CORONARY BALLOON ANGIOPLASTY;  Surgeon: Sinclair Grooms, MD;  Location: [facility] CATH LAB;  Service: Cardiovascular;;  diag  . COLONOSCOPY    . CORONARY ANGIOPLASTY WITH STENT PLACEMENT  01/08/2014   "1"  . DILATATION & CURETTAGE/HYSTEROSCOPY WITH MYOSURE N/A 03/24/2016   Procedure: DILATATION & CURETTAGE/HYSTEROSCOPY WITH MYOSURE;  Surgeon: Megan Salon, MD;  Location: [facility];  Service: Gynecology;  Laterality: N/A;  . DILATION AND CURETTAGE OF UTERUS    . FRACTURE SURGERY    .  LAPAROSCOPIC CHOLECYSTECTOMY  4/97  . LEFT HEART CATH AND CORONARY ANGIOGRAPHY N/A 11/19/2019   Procedure: LEFT HEART CATH AND CORONARY ANGIOGRAPHY;  Surgeon: Martinique, Peter M, MD;  Location: [place] CV LAB;  Service: Cardiovascular;  Laterality: N/A;  . LEFT HEART CATHETERIZATION WITH CORONARY ANGIOGRAM N/A 07/04/2013   Procedure: LEFT HEART CATHETERIZATION WITH CORONARY ANGIOGRAM;  Surgeon: Sinclair Grooms, MD;  Location: [facility] CATH LAB;  Service: Cardiovascular;  Laterality: N/A;  . LEFT HEART CATHETERIZATION WITH CORONARY ANGIOGRAM N/A 01/08/2014   Procedure: LEFT HEART CATHETERIZATION WITH CORONARY ANGIOGRAM;  Surgeon: Sinclair Grooms, MD;  Location: [facility] CATH LAB;  Service: Cardiovascular;  Laterality: N/A;  . MOUTH SURGERY    . PERCUTANEOUS STENT INTERVENTION  01/08/2014   Procedure: PERCUTANEOUS STENT INTERVENTION;  Surgeon: Sinclair Grooms, MD;  Location: [facility] CATH LAB;  Service: Cardiovascular;;  MID LAD  . POSTERIOR CERVICAL FUSION/FORAMINOTOMY  04/04/2011   Procedure: POSTERIOR CERVICAL FUSION/FORAMINOTOMY LEVEL 1;  Surgeon: Charlie Pitter, MD;  Location: [facility]O OR[facility];  Service: Neurosurgery;  Laterality: Left;  Left Cervical Six-Seven Laminectomy, Foraminotomy, Diskectomy   . TONSILLECTOMY    . WISDOM TOOTH EXTRACTION    . WRIST FRACTURE SURGERY Right 1990's     Current Outpatient Medications  Medication Sig Dispense Refill  . aspirin EC 81 MG EC tablet Take 1 tablet (81 mg total) by mouth daily. Swallow whole. 90 tablet 3  . augmented betamethasone dipropionate (DIPROLENE-AF) 0.05 % cream Apply 1 application topically daily as needed (for skin irritation).     . cyclobenzaprine (FLEXERIL) 10 MG tablet Take 10 mg by mouth 3 (three) times daily as needed for muscle spasms.   0  . diphenhydrAMINE (BENADRYL) 25 mg capsule Take 25 mg by mouth every 6 (six) hours as needed for sleep.    [name] estradiol (ESTRACE) 0.1 MG/GM vaginal cream Place 1 Applicatorful vaginally daily as needed. 1 gram  vaginally daily prn 42.5 g 3  . ezetimibe (ZETIA) 10 MG tablet TAKE 1 TABLET BY MOUTH EVERY DAY 90 tablet 2  . famotidine (PEPCID) 20 MG tablet Take 20 mg by mouth 2 (two) times daily as needed for heartburn or indigestion.    . fish oil-omega-3 fatty acids 1000 MG capsule Take 1 g by mouth daily.    . halobetasol (ULTRAVATE) 0.05 % cream Apply 1 application topically daily as needed (for skin irritation).     . hydrocortisone valerate cream (WESTCORT) 0.2 % Apply 1 application topically 2 (two) times daily as needed (rash).     [name] levothyroxine (SYNTHROID, LEVOTHROID) 88 MCG tablet Take 100 mcg by mouth daily before breakfast.    . losartan (COZAAR) 100 MG tablet Take 100 mg by mouth daily.     . metoprolol tartrate (LOPRESSOR) 25 MG tablet Take 1/2 (one-half) tablet by mouth once daily  45 tablet 3  . Multiple Vitamins-Minerals (PRESERVISION AREDS PO) Take 1 capsule by mouth daily.    . nitroGLYCERIN (NITROSTAT) 0.4 MG SL tablet PLACE 1 TABLET (0.4 MG TOTAL) UNDER THE TONGUE EVERY 5 (FIVE) MINUTES AS NEEDED FOR CHEST PAIN. 25 tablet 3  . NONFORMULARY OR COMPOUNDED ITEM [facility]:  Antifungal topical - Terbinafine 3%, Fluconazole 2%, Tea Tree Oil 5%, Urea 10%, Ibuprofen 2%, in #3ml DMSO suspension. Apply to affected toenail(s) once daily (at bedtime) or twice daily 30 each 11  . ONETOUCH VERIO test strip 1 each by Other route every other day.     . rosuvastatin (CRESTOR) 40 MG tablet Take 40 mg by mouth daily.     No current facility-administered medications for this visit.    Allergies:   Ace inhibitors, Prilosec [omeprazole magnesium], Sulfa antibiotics, and Ultram [tramadol hcl]    Social History:  The patient  reports that she has never smoked. She has never used smokeless tobacco. She reports that she does not drink alcohol and does not use drugs.   Family History:  The patient's family history includes Asthma in her sister; Diabetes type II in her father; Heart Problems in her  sister; Heart disease in her father and mother; Hypertension in her brother, father, mother, sister, and sister.    ROS:  Please see the history of present illness. Otherwise, review of systems are positive for none.   All other systems are reviewed and negative.    PHYSICAL EXAM: VS:  BP 128/70   Pulse 69   Ht 5\' 4"  (1.626 m)   Wt 196 lb (88.9 kg)   LMP 02/06/2010 Comment: bleeding 12-14-2018  SpO2 99%   BMI 33.64 kg/m  , BMI Body mass index is 33.64 kg/m.   General: Well developed, well nourished, NAD Neck: Negative for carotid bruits. No JVD Lungs:Clear to ausculation bilaterally. No wheezes, rales, or rhonchi. Breathing is unlabored. Cardiovascular: RRR with S1 S2. No murmurs Extremities: No edema. Radial pulses 2+ bilaterally Neuro: Alert and oriented. No focal deficits. No facial asymmetry. MAE spontaneously. Psych: Responds to questions appropriately with normal affect.    EKG:  EKG is not ordered today.  Recent Labs: 11/18/2019: Magnesium 2.2; TSH 6.387 11/19/2019: BUN 17; Creatinine, Ser 0.80; Hemoglobin 13.3; Platelets 161; Potassium 3.8; Sodium 139    Lipid Panel    Component Value Date/Time   CHOL 100 11/19/2019 0258   TRIG 100 11/19/2019 0258   HDL 42 11/19/2019 0258   CHOLHDL 2.4 11/19/2019 0258   VLDL 20 11/19/2019 0258   LDLCALC 38 11/19/2019 0258     Wt Readings from Last 3 Encounters:  11/28/19 196 lb (88.9 kg)  11/19/19 165 lb 5.5 oz (75 kg)  04/03/19 170 lb 12.8 oz (77.5 kg)    Other studies Reviewed: Additional studies/ records that were reviewed today include: Review of the above records demonstrates:    Left heart cath 11/19/19:  Prox LAD to Mid LAD lesion is 25% stenosed.  Previously placed Mid LAD stent (unknown type) is widely patent.  Ost Cx to Prox Cx lesion is 30% stenosed.  1st Mrg lesion is 25% stenosed.  Mid Cx to Dist Cx lesion is 25% stenosed.  LV end diastolic pressure is normal.  1. Nonobstructive CAD. Prior stent  in the LAD is patent 2. Normal LVEDP.  Plan: consider other causes for her symptoms. Continue risk factor modification.   _____________   Echo 11/19/19: 1. Left ventricular ejection fraction, by estimation, is 60  to 65%. The  left ventricle has normal function. The left ventricle has no regional  wall motion abnormalities. Left ventricular diastolic parameters are  consistent with Grade I diastolic  dysfunction (impaired relaxation).  2. Right ventricular systolic function is normal. The right ventricular  size is normal.  3. Left atrial size was mildly dilated.  4. The mitral valve is normal in structure. No evidence of mitral valve  regurgitation. No evidence of mitral stenosis.  5. The aortic valve is normal in structure. Aortic valve regurgitation is  not visualized. No aortic stenosis is present.  6. The inferior vena cava is normal in size with greater than 50%  respiratory variability, suggesting right atrial pressure of 3 mmHg.   ASSESSMENT AND PLAN:  1. CAD with stable CAD per cath: -Pt with a history of CAD presented with chest pain suspicious for angina. She deferred stress testing therefore she underwent LHC that showed stable disease.Famotidine 20 mg daily was added to her regimen out of concern that symptoms were related to GERD.  -She continues to have exertional chest pain which has been relieved with SL NTG therefore we discussed adding long acting nitrates to her regime -Add IMDUR 15mg  PO QD and titrate if needed at follow up   2. Hypertension: -Stable, 128/70 -Continue losartan and lopressor  3. Hyperlipidemia: -Last LDL, 38 -Continue Crestor and zetia with LDL goal < 70  4. NSVT: -During hospitalization with no c/o palpitations -Continue beta blocker   Current medicines are reviewed at length with the patient today.  The patient does not have concerns regarding medicines.  The following changes have been made:  Add IMDUR 15mg  PO QD   Labs/  tests ordered today include: None  No orders of the defined types were placed in this encounter.  Disposition:   FU with myself  in 6 weeks  Signed, [name], NP  11/28/2019 11:41 AM    [address], [place], [place]  [address] Phone: [phone]; Fax: [phone]

## 2019-11-21 ENCOUNTER — Telehealth: Payer: Self-pay | Admitting: Interventional Cardiology

## 2019-11-21 NOTE — Telephone Encounter (Signed)
New Message:    Please call, pt had Cath on 11-19-19 and have a question about the dressing.

## 2019-11-21 NOTE — Telephone Encounter (Signed)
Spoke with pt and made her aware that she can go ahead and take that dressing off.  Pt appreciative for call.

## 2019-11-28 ENCOUNTER — Other Ambulatory Visit: Payer: Self-pay

## 2019-11-28 ENCOUNTER — Ambulatory Visit (INDEPENDENT_AMBULATORY_CARE_PROVIDER_SITE_OTHER): Payer: Medicare Other | Admitting: Cardiology

## 2019-11-28 ENCOUNTER — Encounter: Payer: Self-pay | Admitting: Cardiology

## 2019-11-28 VITALS — BP 128/70 | HR 69 | Ht 64.0 in | Wt 196.0 lb

## 2019-11-28 DIAGNOSIS — E782 Mixed hyperlipidemia: Secondary | ICD-10-CM

## 2019-11-28 DIAGNOSIS — I2510 Atherosclerotic heart disease of native coronary artery without angina pectoris: Secondary | ICD-10-CM | POA: Diagnosis not present

## 2019-11-28 DIAGNOSIS — I4729 Other ventricular tachycardia: Secondary | ICD-10-CM

## 2019-11-28 DIAGNOSIS — I472 Ventricular tachycardia: Secondary | ICD-10-CM | POA: Diagnosis not present

## 2019-11-28 DIAGNOSIS — Z9861 Coronary angioplasty status: Secondary | ICD-10-CM

## 2019-11-28 DIAGNOSIS — I10 Essential (primary) hypertension: Secondary | ICD-10-CM

## 2019-11-28 MED ORDER — ISOSORBIDE MONONITRATE ER 30 MG PO TB24: 15.0000 mg | ORAL_TABLET | Freq: Every day | ORAL | 3 refills | Status: DC

## 2019-11-28 NOTE — Patient Instructions (Addendum)
Medication Instructions:  1. Start isosorbide mononitrate (Imdur) 15 mg by mouth daily.  *If you need a refill on your cardiac medications before your next appointment, please call your pharmacy*   Lab Work: None If you have labs (blood work) drawn today and your tests are completely normal, you will receive your results only by: [name] MyChart Message (if you have MyChart) OR . A paper copy in the mail If you have any lab test that is abnormal or we need to change your treatment, we will call you to review the results.   Testing/Procedures: None   Follow-Up: At [facility], you and your health needs are our priority.  As part of our continuing mission to provide you with exceptional heart care, we have created designated Provider Care Teams.  These Care Teams include your primary Cardiologist (physician) and Advanced Practice Providers (APPs -  Physician Assistants and Nurse Practitioners) who all work together to provide you with the care you need, when you need it.    Your next appointment:  6-8 weeks You are scheduled to see [name] on 01/16/20 at 3:45 PM.

## 2019-12-06 ENCOUNTER — Ambulatory Visit: Payer: Medicare Other | Attending: Internal Medicine

## 2019-12-06 DIAGNOSIS — Z23 Encounter for immunization: Secondary | ICD-10-CM

## 2019-12-06 NOTE — Progress Notes (Signed)
   Covid-19 Vaccination Clinic  Name:  Veronica [name]    MRN: [id number] DOB: [birth year]  12/06/2019  [name] was observed post Covid-19 immunization for 15 minutes without incident. She was provided with Vaccine Information Sheet and instruction to access the V-Safe system.   [name] was instructed to call 911 with any severe reactions post vaccine: [name] Difficulty breathing  . Swelling of face and throat  . A fast heartbeat  . A bad rash all over body  . Dizziness and weakness

## 2019-12-09 ENCOUNTER — Other Ambulatory Visit: Payer: Self-pay | Admitting: Obstetrics & Gynecology

## 2019-12-27 ENCOUNTER — Other Ambulatory Visit: Payer: Self-pay | Admitting: Obstetrics & Gynecology

## 2020-01-12 NOTE — Progress Notes (Unsigned)
Cardiology Office Note   Date:  01/16/2020   ID:  Veronica [name], Veronica [name] Mar 25, 1950, MRN [id number]  PCP:  Hulan Fess, MD  Cardiologist: Dr. Daneen Schick, MD  Chief Complaint  Patient presents with  . Follow-up   History of Present Illness: Veronica [name] is a 69 y.o. female who presents for follow up, seen for Dr. Tamala Julian.   Veronica [name] a 69 y.o.femalewith CAD s/p LAD PCI, DM, HTN, and HLD who was admitted with chest pain.  [name] She previously had stenting to the mid to distal LAD in 2015. FFR of the RCA (50-70%) was performed which was a non-critical value of 0.9. LV function was normal. LHC showed diffuse pLAD disease 30-35% and moderate mid circumflex and first marginal diffuse disease, less than 50% obstructed. Patient had a Myoview stress test 02/2017 showing EF 65%, with a medium defect of moderate severity, but overall low risk study. Patient was last seen 04/03/2019 and was symptomatically stable and reported rare NTG use.   On 11/18/19 she presented to the ED for chest pain which woke her from sleep described as left-sided and radiating into her left shoulder, up her neck, and into her back. She deferred stress testing therefore she underwent repeat LHC which showed patent LAD stent and otherwise minimal to mild disease. Chest pain was suspected to be secondary to GERD therefore she was started on famotidine 20 mg daily, as she has a PPI allergy.    She was then seen by myself in follow up and reported continual chest pain with activity, more specifically with walking up hills. She has taken SL NTG which helped. We discussed her cath results  however given her persistent pain, we discussed adding long acting nitrates to her regimen. Otherwise she has no specific complaints.   Today she is here for follow up and reports a significant improvement in her general chest pain symptoms since starting Imdur.  She does state that  she is under more stress at home as her husband has been diagnosed with pneumonia and will need to undergo knee replacement surgery.  When under more stress, she will have increased chest pressure.  Given this, we discussed increasing her Imdur to 30 mg p.o. daily.  She is in agreement.    Past Medical History:  Diagnosis Date  . Angina, class IV ([place]) 01/2014  . Anxiety    "menopausal related", history of  . CAD S/P mLAD Bifurcation PCI: Resolute DES 2.25 mm x 18 mm(2.75 mm), 2.0 mm Angiosculpt of Diag  01/08/2014   [place] 0,1,1 midLAD-Diag lesion - progressed from Moderate to Severe 90% LAD, 70-80% Diag from 6-12 2015 --> Class IV Angina --> DES PCI with Angiosculpt PCI of Diag  FFR of m-dRCA 50-70% = 0.9   . Complication of anesthesia    extreme sore throat and hoarseness after aneathesia, also "woke up screamimg" after surgery once"  . Coronary artery disease involving native coronary artery with angina pectoris with documented spasm ([place]) 06/2013   h/o moderate 3 Vessel CAD with mLAD-Diag bifurcation ([place] 0,1,1)  . Diabetes mellitus without complication ([place])   . Essential hypertension    stress test 10 yrs ago.   pcp   dr Lennette Bihari little  . GERD (gastroesophageal reflux disease)   . Headache    Migraines childhood  . History of migraine headaches    "as a child"  . Hyperlipemia   . Hyperthyroidism    "  had radioactive iodine tx in the 1980's"  . Hypothyroidism   . Osteoarthritis    "joints ache"  . Osteopenia     Past Surgical History:  Procedure Laterality Date  . BACK SURGERY    . BREAST CYST ASPIRATION Left 04/1999   "done in dr's office"  . CARDIAC CATHETERIZATION  06/2013  . CARDIAC CATHETERIZATION  01/08/2014   Procedure: INTRAVASCULAR PRESSURE WIRE/FFR STUDY;  Surgeon: Sinclair Grooms, MD;  Location: [facility] CATH LAB;  Service: Cardiovascular;;  distal RCA  . CARDIAC CATHETERIZATION  01/08/2014   Procedure: CORONARY BALLOON ANGIOPLASTY;  Surgeon: Sinclair Grooms, MD;   Location: [facility] CATH LAB;  Service: Cardiovascular;;  diag  . COLONOSCOPY    . CORONARY ANGIOPLASTY WITH STENT PLACEMENT  01/08/2014   "1"  . DILATATION & CURETTAGE/HYSTEROSCOPY WITH MYOSURE N/A 03/24/2016   Procedure: DILATATION & CURETTAGE/HYSTEROSCOPY WITH MYOSURE;  Surgeon: Megan Salon, MD;  Location: [facility];  Service: Gynecology;  Laterality: N/A;  . DILATION AND CURETTAGE OF UTERUS    . FRACTURE SURGERY    . LAPAROSCOPIC CHOLECYSTECTOMY  4/97  . LEFT HEART CATH AND CORONARY ANGIOGRAPHY N/A 11/19/2019   Procedure: LEFT HEART CATH AND CORONARY ANGIOGRAPHY;  Surgeon: Martinique, Peter M, MD;  Location: [place] CV LAB;  Service: Cardiovascular;  Laterality: N/A;  . LEFT HEART CATHETERIZATION WITH CORONARY ANGIOGRAM N/A 07/04/2013   Procedure: LEFT HEART CATHETERIZATION WITH CORONARY ANGIOGRAM;  Surgeon: Sinclair Grooms, MD;  Location: [facility] CATH LAB;  Service: Cardiovascular;  Laterality: N/A;  . LEFT HEART CATHETERIZATION WITH CORONARY ANGIOGRAM N/A 01/08/2014   Procedure: LEFT HEART CATHETERIZATION WITH CORONARY ANGIOGRAM;  Surgeon: Sinclair Grooms, MD;  Location: [facility] CATH LAB;  Service: Cardiovascular;  Laterality: N/A;  . MOUTH SURGERY    . PERCUTANEOUS STENT INTERVENTION  01/08/2014   Procedure: PERCUTANEOUS STENT INTERVENTION;  Surgeon: Sinclair Grooms, MD;  Location: [facility] CATH LAB;  Service: Cardiovascular;;  MID LAD  . POSTERIOR CERVICAL FUSION/FORAMINOTOMY  04/04/2011   Procedure: POSTERIOR CERVICAL FUSION/FORAMINOTOMY LEVEL 1;  Surgeon: Charlie Pitter, MD;  Location: [place];  Service: Neurosurgery;  Laterality: Left;  Left Cervical Six-Seven Laminectomy, Foraminotomy, Diskectomy   . TONSILLECTOMY    . WISDOM TOOTH EXTRACTION    . WRIST FRACTURE SURGERY Right 1990's     Current Outpatient Medications  Medication Sig Dispense Refill  . aspirin EC 81 MG EC tablet Take 1 tablet (81 mg total) by mouth daily. Swallow whole. 90 tablet 3  . augmented betamethasone dipropionate  (DIPROLENE-AF) 0.05 % cream Apply 1 application topically daily as needed (for skin irritation).     . cyclobenzaprine (FLEXERIL) 10 MG tablet Take 10 mg by mouth 3 (three) times daily as needed for muscle spasms.   0  . diphenhydrAMINE (BENADRYL) 25 mg capsule Take 25 mg by mouth every 6 (six) hours as needed for sleep.    [name] estradiol (ESTRACE) 0.1 MG/GM vaginal cream Place 1 Applicatorful vaginally daily as needed. 1 gram vaginally daily prn 42.5 g 3  . ezetimibe (ZETIA) 10 MG tablet TAKE 1 TABLET BY MOUTH EVERY DAY 90 tablet 2  . famotidine (PEPCID) 20 MG tablet Take 20 mg by mouth 2 (two) times daily as needed for heartburn or indigestion.    . fish oil-omega-3 fatty acids 1000 MG capsule Take 1 g by mouth daily.    . halobetasol (ULTRAVATE) 0.05 % cream Apply 1 application topically daily as needed (for skin irritation).     [name]  hydrocortisone valerate cream (WESTCORT) 0.2 % Apply 1 application topically 2 (two) times daily as needed (rash).     . isosorbide mononitrate (IMDUR) 30 MG 24 hr tablet Take 0.5 tablets (15 mg total) by mouth daily. 45 tablet 3  . levothyroxine (SYNTHROID, LEVOTHROID) 88 MCG tablet Take 100 mcg by mouth daily before breakfast.    . losartan (COZAAR) 100 MG tablet Take 100 mg by mouth daily.     . metoprolol tartrate (LOPRESSOR) 25 MG tablet Take 1/2 (one-half) tablet by mouth once daily 45 tablet 3  . Multiple Vitamins-Minerals (PRESERVISION AREDS PO) Take 1 capsule by mouth daily.    . nitroGLYCERIN (NITROSTAT) 0.4 MG SL tablet PLACE 1 TABLET (0.4 MG TOTAL) UNDER THE TONGUE EVERY 5 (FIVE) MINUTES AS NEEDED FOR CHEST PAIN. 25 tablet 3  . NONFORMULARY OR COMPOUNDED ITEM [facility]:  Antifungal topical - Terbinafine 3%, Fluconazole 2%, Tea Tree Oil 5%, Urea 10%, Ibuprofen 2%, in #48ml DMSO suspension. Apply to affected toenail(s) once daily (at bedtime) or twice daily 30 each 11  . ONETOUCH VERIO test strip 1 each by Other route every other day.     .  rosuvastatin (CRESTOR) 40 MG tablet Take 40 mg by mouth daily.     No current facility-administered medications for this visit.    Allergies:   Ace inhibitors, Prilosec [omeprazole magnesium], Sulfa antibiotics, and Ultram [tramadol hcl]    Social History:  The patient  reports that she has never smoked. She has never used smokeless tobacco. She reports that she does not drink alcohol and does not use drugs.   Family History:  The patient's family history includes Asthma in her sister; Diabetes type II in her father; Heart Problems in her sister; Heart disease in her father and mother; Hypertension in her brother, father, mother, sister, and sister.    ROS:  Please see the history of present illness.   Otherwise, review of systems are positive for none.   All other systems are reviewed and negative.   PHYSICAL EXAM: VS:  BP (!) 142/78   Pulse 87   Ht 5\' 4"  (1.626 m)   Wt 171 lb (77.6 kg)   LMP 02/06/2010 Comment: bleeding 12-14-2018  SpO2 97%   BMI 29.35 kg/m  , BMI Body mass index is 29.35 kg/m.   General: Well developed, well nourished, NAD Neck: Negative for carotid bruits. No JVD Lungs:Clear to ausculation bilaterally. No wheezes, rales, or rhonchi. Breathing is unlabored. Cardiovascular: RRR with S1 S2. No murmurs Extremities: No edema. Neuro: Alert and oriented. No focal deficits. No facial asymmetry. MAE spontaneously. Psych: Responds to questions appropriately with normal affect.    EKG:  EKG is not ordered today.  Recent Labs: 11/18/2019: Magnesium 2.2; TSH 6.387 11/19/2019: BUN 17; Creatinine, Ser 0.80; Hemoglobin 13.3; Platelets 161; Potassium 3.8; Sodium 139    Lipid Panel    Component Value Date/Time   CHOL 100 11/19/2019 0258   TRIG 100 11/19/2019 0258   HDL 42 11/19/2019 0258   CHOLHDL 2.4 11/19/2019 0258   VLDL 20 11/19/2019 0258   LDLCALC 38 11/19/2019 0258      Wt Readings from Last 3 Encounters:  01/16/20 171 lb (77.6 kg)  11/28/19 196 lb (88.9  kg)  11/19/19 165 lb 5.5 oz (75 kg)      Other studies Reviewed: Additional studies/ records that were reviewed today include: . Review of the above records demonstrates:    Left heart cath 11/19/19:  Prox LAD to [facility]  LAD lesion is 25% stenosed.  Previously placed Mid LAD stent (unknown type) is widely patent.  Ost Cx to Prox Cx lesion is 30% stenosed.  1st Mrg lesion is 25% stenosed.  Mid Cx to Dist Cx lesion is 25% stenosed.  LV end diastolic pressure is normal.  1. Nonobstructive CAD. Prior stent in the LAD is patent 2. Normal LVEDP.  Plan: consider other causes for her symptoms. Continue risk factor modification.  _____________  Echo 11/19/19: 1. Left ventricular ejection fraction, by estimation, is 60 to 65%. The  left ventricle has normal function. The left ventricle has no regional  wall motion abnormalities. Left ventricular diastolic parameters are  consistent with Grade I diastolic  dysfunction (impaired relaxation).  2. Right ventricular systolic function is normal. The right ventricular  size is normal.  3. Left atrial size was mildly dilated.  4. The mitral valve is normal in structure. No evidence of mitral valve  regurgitation. No evidence of mitral stenosis.  5. The aortic valve is normal in structure. Aortic valve regurgitation is  not visualized. No aortic stenosis is present.  6. The inferior vena cava is normal in size with greater than 50%  respiratory variability, suggesting right atrial pressure of 3 mmHg.   ASSESSMENT AND PLAN:  1. CAD with stable CAD per cath: -At last follow up, she had continued to have exertional chest pain which was relieved with SL NTG therefore IMDUR 15mg  PO QD was added. Since then she reports great improvement in her symptoms however under stressful situations, will have chest pressure.  Given this, we discussed uptitrating her Imdur to 30 mg daily which should help.    2. Hypertension: -Stable,  124/68 -Continue losartan and lopressor  3. Hyperlipidemia: -Last LDL, 38 -Continue Crestor and Zetia with LDL goal < 70  4. NSVT: -During hospitalization with no c/o palpitations -Continue beta blocker    Current medicines are reviewed at length with the patient today.  The patient does not have concerns regarding medicines.  The following changes have been made:  Increase Imdur to 30mg  PO QD  Labs/ tests ordered today include: None  No orders of the defined types were placed in this encounter.   Disposition:   FU with Dr. Tamala Julian in 6 months  Signed, [name], NP  01/16/2020 4:04 PM    [facility] [address], [place], [place]  [address] Phone: [phone]; Fax: [phone]

## 2020-01-16 ENCOUNTER — Encounter: Payer: Self-pay | Admitting: Cardiology

## 2020-01-16 ENCOUNTER — Other Ambulatory Visit: Payer: Self-pay

## 2020-01-16 ENCOUNTER — Ambulatory Visit (INDEPENDENT_AMBULATORY_CARE_PROVIDER_SITE_OTHER): Payer: Medicare Other | Admitting: Cardiology

## 2020-01-16 VITALS — BP 142/78 | HR 87 | Ht 64.0 in | Wt 171.0 lb

## 2020-01-16 DIAGNOSIS — I2510 Atherosclerotic heart disease of native coronary artery without angina pectoris: Secondary | ICD-10-CM | POA: Diagnosis not present

## 2020-01-16 DIAGNOSIS — Z9861 Coronary angioplasty status: Secondary | ICD-10-CM

## 2020-01-16 DIAGNOSIS — I10 Essential (primary) hypertension: Secondary | ICD-10-CM | POA: Diagnosis not present

## 2020-01-16 MED ORDER — ISOSORBIDE MONONITRATE ER 30 MG PO TB24: 30.0000 mg | ORAL_TABLET | Freq: Every day | ORAL | 3 refills | Status: DC

## 2020-01-16 NOTE — Patient Instructions (Signed)
Medication Instructions:  Your physician has recommended you make the following change in your medication:  1. INCREASE IMDUR TO 30 MG DAILY.  *If you need a refill on your cardiac medications before your next appointment, please call your pharmacy*   Lab Work: NONE If you have labs (blood work) drawn today and your tests are completely normal, you will receive your results only by: [name] MyChart Message (if you have MyChart) OR . A paper copy in the mail If you have any lab test that is abnormal or we need to change your treatment, we will call you to review the results.   Testing/Procedures: NONE   Follow-Up: At [facility], you and your health needs are our priority.  As part of our continuing mission to provide you with exceptional heart care, we have created designated Provider Care Teams.  These Care Teams include your primary Cardiologist (physician) and Advanced Practice Providers (APPs -  Physician Assistants and Nurse Practitioners) who all work together to provide you with the care you need, when you need it.  We recommend signing up for the patient portal called "MyChart".  Sign up information is provided on this After Visit Summary.  MyChart is used to connect with patients for Virtual Visits (Telemedicine).  Patients are able to view lab/test results, encounter notes, upcoming appointments, etc.  Non-urgent messages can be sent to your provider as well.   To learn more about what you can do with MyChart, go to [email or web address].    Your next appointment:   6 month(s)  The format for your next appointment:   In Person  Provider:   You may see Sinclair Grooms, MD or one of the following Advanced Practice Providers on your designated Care Team:    Truitt Merle, NP  Cecilie Kicks, NP  [name], NP    Other Instructions  RESCHEDULE APPOINTMENT WITH DR. Tamala Julian IN FEB. 2022 TO 6 MONTHS

## 2020-03-19 ENCOUNTER — Ambulatory Visit: Payer: Medicare Other

## 2020-03-22 ENCOUNTER — Other Ambulatory Visit: Payer: Self-pay | Admitting: Obstetrics & Gynecology

## 2020-03-25 ENCOUNTER — Other Ambulatory Visit: Payer: Self-pay

## 2020-03-25 MED ORDER — METOPROLOL TARTRATE 25 MG PO TABS: ORAL_TABLET | ORAL | 3 refills | Status: DC

## 2020-04-02 ENCOUNTER — Ambulatory Visit: Payer: Medicare Other | Admitting: Interventional Cardiology

## 2020-05-07 ENCOUNTER — Encounter: Payer: Medicare Other | Admitting: Nurse Practitioner

## 2020-05-31 ENCOUNTER — Ambulatory Visit (INDEPENDENT_AMBULATORY_CARE_PROVIDER_SITE_OTHER): Payer: Medicare Other | Admitting: Obstetrics & Gynecology

## 2020-05-31 ENCOUNTER — Other Ambulatory Visit: Payer: Self-pay

## 2020-05-31 ENCOUNTER — Encounter: Payer: Self-pay | Admitting: Obstetrics & Gynecology

## 2020-05-31 VITALS — BP 135/82 | HR 89 | Ht 64.0 in | Wt 170.8 lb

## 2020-05-31 DIAGNOSIS — K649 Unspecified hemorrhoids: Secondary | ICD-10-CM | POA: Diagnosis not present

## 2020-05-31 DIAGNOSIS — N905 Atrophy of vulva: Secondary | ICD-10-CM | POA: Diagnosis not present

## 2020-05-31 DIAGNOSIS — Z01419 Encounter for gynecological examination (general) (routine) without abnormal findings: Secondary | ICD-10-CM | POA: Diagnosis not present

## 2020-05-31 MED ORDER — ESTRADIOL 0.1 MG/GM VA CREA: 1.0000 | TOPICAL_CREAM | Freq: Every day | VAGINAL | 5 refills | Status: DC | PRN

## 2020-05-31 MED ORDER — HYDROCORTISONE (PERIANAL) 2.5 % EX CREA: 1.0000 "application " | TOPICAL_CREAM | Freq: Two times a day (BID) | CUTANEOUS | 5 refills | Status: AC

## 2020-05-31 MED ORDER — CLOBETASOL PROPIONATE 0.05 % EX OINT: 1.0000 "application " | TOPICAL_OINTMENT | Freq: Two times a day (BID) | CUTANEOUS | 5 refills | Status: DC

## 2020-05-31 NOTE — Progress Notes (Signed)
GYNECOLOGY ANNUAL PREVENTATIVE CARE ENCOUNTER NOTE  History:     Veronica [name] is a 70 y.o. [id number] PMP female here for a routine annual gynecologic exam and to establish care.  Current complaints: none, desires refill of her medications.   Denies abnormal vaginal bleeding, discharge, pelvic pain, problems with intercourse or other gynecologic concerns.    Gynecologic History Patient's last menstrual period was 02/06/2010. Last Pap: 03/07/2017. Results were: normal with negative HPV Last mammogram: 05/2020. Results were: normal  Obstetric History OB History  Gravida Para Term Preterm AB Living  2 2 2     2   SAB IAB Ectopic Multiple Live Births          2    # Outcome Date GA Lbr Len/2nd Weight Sex Delivery Anes PTL Lv  2 Term 10/02/77    M Vag-Spont None  LIV  1 Term 09/28/71    [facility]    Past Medical History:  Diagnosis Date  . Angina, class IV ([place]) 01/2014  . Anxiety    "menopausal related", history of  . CAD S/P mLAD Bifurcation PCI: Resolute DES 2.25 mm x 18 mm(2.75 mm), 2.0 mm Angiosculpt of Diag  01/08/2014   [place] 0,1,1 midLAD-Diag lesion - progressed from Moderate to Severe 90% LAD, 70-80% Diag from 6-12 2015 --> Class IV Angina --> DES PCI with Angiosculpt PCI of Diag  FFR of m-dRCA 50-70% = 0.9   . Complication of anesthesia    extreme sore throat and hoarseness after aneathesia, also "woke up screamimg" after surgery once"  . Coronary artery disease involving native coronary artery with angina pectoris with documented spasm ([place]) 06/2013   h/o moderate 3 Vessel CAD with mLAD-Diag bifurcation ([place] 0,1,1)  . Diabetes mellitus without complication ([place])   . Essential hypertension    stress test 10 yrs ago.   pcp   dr Lennette Bihari little  . GERD (gastroesophageal reflux disease)   . Headache    Migraines childhood  . History of migraine headaches    "as a child"  . Hyperlipemia   . Hyperthyroidism    "had radioactive iodine tx in the 1980's"  .  Hypothyroidism   . Osteoarthritis    "joints ache"  . Osteopenia     Past Surgical History:  Procedure Laterality Date  . BACK SURGERY    . BREAST CYST ASPIRATION Left 04/1999   "done in dr's office"  . CARDIAC CATHETERIZATION  06/2013  . CARDIAC CATHETERIZATION  01/08/2014   Procedure: INTRAVASCULAR PRESSURE WIRE/FFR STUDY;  Surgeon: Sinclair Grooms, MD;  Location: [facility] CATH LAB;  Service: Cardiovascular;;  distal RCA  . CARDIAC CATHETERIZATION  01/08/2014   Procedure: CORONARY BALLOON ANGIOPLASTY;  Surgeon: Sinclair Grooms, MD;  Location: [facility] CATH LAB;  Service: Cardiovascular;;  diag  . COLONOSCOPY    . CORONARY ANGIOPLASTY WITH STENT PLACEMENT  01/08/2014   "1"  . DILATATION & CURETTAGE/HYSTEROSCOPY WITH MYOSURE N/A 03/24/2016   Procedure: DILATATION & CURETTAGE/HYSTEROSCOPY WITH MYOSURE;  Surgeon: Megan Salon, MD;  Location: [facility] [facility];  Service: Gynecology;  Laterality: N/A;  . DILATION AND CURETTAGE OF UTERUS    . FRACTURE SURGERY    . LAPAROSCOPIC CHOLECYSTECTOMY  4/97  . LEFT HEART CATH AND CORONARY ANGIOGRAPHY N/A 11/19/2019   Procedure: LEFT HEART CATH AND CORONARY ANGIOGRAPHY;  Surgeon: Martinique, Peter M, MD;  Location: [place] CV LAB;  Service: Cardiovascular;  Laterality: N/A;  . LEFT HEART CATHETERIZATION WITH CORONARY  ANGIOGRAM N/A 07/04/2013   Procedure: LEFT HEART CATHETERIZATION WITH CORONARY ANGIOGRAM;  Surgeon: Sinclair Grooms, MD;  Location: [facility] CATH LAB;  Service: Cardiovascular;  Laterality: N/A;  . LEFT HEART CATHETERIZATION WITH CORONARY ANGIOGRAM N/A 01/08/2014   Procedure: LEFT HEART CATHETERIZATION WITH CORONARY ANGIOGRAM;  Surgeon: Sinclair Grooms, MD;  [facility] C[facility] LAB;  Service: Cardiovascular;  Laterality: N/A;  . MOUTH SURGERY    . PERCUTANEOUS STENT INTERVENTION  01/08/2014   Procedure: PERCUTANEOUS STENT INTERVENTION;  Surgeon: Sinclair Grooms, MD;  Location: [facility] C[facility] LAB;  Service: Cardiovascular;;  MID LAD  . POSTERIOR CERVICAL  FUSION/FORAMINOTOMY  04/04/2011   Procedure: POSTERIOR CERVICAL FUSION/FORAMINOTOMY LEVEL 1;  Surgeon: Charlie Pitter, MD;  Location: [place] [facility];  Service: Neurosurgery;  Laterality: Left;  Left Cervical Six-Seven Laminectomy, Foraminotomy, Diskectomy   . TONSILLECTOMY    . WISDOM TOOTH EXTRACTION    . WRIST FRACTURE SURGERY Right 1990's    Current Outpatient Medications on File Prior to Visit  Medication Sig Dispense Refill  . aspirin EC 81 MG EC tablet Take 1 tablet (81 mg total) by mouth daily. Swallow whole. 90 tablet 3  . cyclobenzaprine (FLEXERIL) 10 MG tablet Take 10 mg by mouth 3 (three) times daily as needed for muscle spasms.   0  . diphenhydrAMINE (BENADRYL) 25 mg capsule Take 25 mg by mouth every 6 (six) hours as needed for sleep.    [name] ezetimibe (ZETIA) 10 MG tablet TAKE 1 TABLET BY MOUTH EVERY DAY 90 tablet 2  . famotidine (PEPCID) 20 MG tablet Take 20 mg by mouth 2 (two) times daily as needed for heartburn or indigestion.    . fish oil-omega-3 fatty acids 1000 MG capsule Take 1 g by mouth daily.    [name] gabapentin (NEURONTIN) 100 MG capsule Take 100 mg by mouth 3 (three) times daily.    . halobetasol (ULTRAVATE) 0.05 % cream Apply 1 application topically daily as needed (for skin irritation).     . hydrocortisone valerate cream (WESTCORT) 0.2 % Apply 1 application topically 2 (two) times daily as needed (rash).     [name] levothyroxine (SYNTHROID, LEVOTHROID) 88 MCG tablet Take 100 mcg by mouth daily before breakfast.    . losartan (COZAAR) 100 MG tablet Take 100 mg by mouth daily.     . metoprolol tartrate (LOPRESSOR) 25 MG tablet Take 1/2 (one-half) tablet by mouth once daily 45 tablet 3  . Multiple Vitamins-Minerals (PRESERVISION AREDS PO) Take 1 capsule by mouth daily.    . nitroGLYCERIN (NITROSTAT) 0.4 MG SL tablet PLACE 1 TABLET (0.4 MG TOTAL) UNDER THE TONGUE EVERY 5 (FIVE) MINUTES AS NEEDED FOR CHEST PAIN. 25 tablet 3  . NONFORMULARY OR COMPOUNDED ITEM [facility]:   Antifungal topical - Terbinafine 3%, Fluconazole 2%, Tea Tree Oil 5%, Urea 10%, Ibuprofen 2%, in #76ml DMSO suspension. Apply to affected toenail(s) once daily (at bedtime) or twice daily 30 each 11  . rosuvastatin (CRESTOR) 40 MG tablet Take 40 mg by mouth daily.    [name] augmented betamethasone dipropionate (DIPROLENE-AF) 0.05 % cream Apply 1 application topically daily as needed (for skin irritation).     . isosorbide mononitrate (IMDUR) 30 MG 24 hr tablet Take 1 tablet (30 mg total) by mouth daily. 90 tablet 3  . ONETOUCH VERIO test strip 1 each by Other route every other day.      No current facility-administered medications on file prior to visit.    Allergies  Allergen Reactions  .  Ace Inhibitors Cough  . Prilosec [Omeprazole Magnesium] Other (See Comments)    Blisters and skin peels off  . Sulfa Antibiotics Other (See Comments)    Blisters and skin peels off  . Ultram [Tramadol Hcl]     Lapse of memory      Social History:  reports that she has never smoked. She has never used smokeless tobacco. She reports that she does not drink alcohol and does not use drugs.  Family History  Problem Relation Age of Onset  . Heart disease Father   . Hypertension Father   . Diabetes type II Father   . Heart disease Mother   . Hypertension Mother   . Hypertension Sister   . Heart Problems Sister   . Asthma Sister   . Hypertension Brother   . Hypertension Sister     The following portions of the patient's history were reviewed and updated as appropriate: allergies, current medications, past family history, past medical history, past social history, past surgical history and problem list.  Review of Systems Pertinent items noted in HPI and remainder of comprehensive ROS otherwise negative.  Physical Exam:  BP 135/82   Pulse 89   Ht 5\' 4"  (1.626 m)   Wt 170 lb 12.8 oz (77.5 kg)   LMP 02/06/2010 Comment: bleeding 12-14-2018  BMI 29.32 kg/m  CONSTITUTIONAL: Well-developed,  well-nourished female in no acute distress.  HENT:  Normocephalic, atraumatic, External right and left ear normal.  EYES: Conjunctivae and EOM are normal. Pupils are equal, round, and reactive to light. No scleral icterus.  NECK: Normal range of motion, supple, no masses.  Normal thyroid.  SKIN: Skin is warm and dry. No rash noted. Not diaphoretic. No erythema. No pallor. MUSCULOSKELETAL: Normal range of motion. No tenderness.  No cyanosis, clubbing, or edema. NEUROLOGIC: Alert and oriented to person, place, and time. Normal reflexes, muscle tone coordination.  PSYCHIATRIC: Normal mood and affect. Normal behavior. Normal judgment and thought content. CARDIOVASCULAR: Normal heart rate noted, regular rhythm RESPIRATORY: Clear to auscultation bilaterally. Effort and breath sounds normal, no problems with respiration noted. BREASTS: Symmetric in size. No masses, tenderness, skin changes, nipple drainage, or lymphadenopathy bilaterally. Performed in the presence of a chaperone. ABDOMEN: Soft, no distention noted.  No tenderness, rebound or guarding.  PELVIC: Atrophic appearing external genitalia and urethral meatus; atrophic appearing distal vaginal mucosa.  No abnormal discharge noted.  Speculum exam deferred.  Performed in the presence of a chaperone.   Assessment and Plan:      1. Hemorrhoids, unspecified hemorrhoid type Desires refill - hydrocortisone (ANUSOL-HC) 2.5 % rectal cream; Place 1 application rectally 2 (two) times daily. As needed  Dispense: 30 g; Refill: 5  2. Vulvar atrophy Desires refill - estradiol (ESTRACE) 0.1 MG/GM vaginal cream; Place 1 Applicatorful vaginally daily as needed.  Dispense: 42.5 g; Refill: 5 - clobetasol ointment (TEMOVATE) 0.05 %; Apply 1 application topically 2 (two) times daily. Apply to affected area as needed  Dispense: 60 g; Refill: 5  3. Well woman exam with routine gynecological exam Up to date on pap smear, discussed that pap smears are not indicated  after age 58 in the absence of history of high grade dysplasia or other concerning risk factors.  Routine preventative health maintenance measures emphasized. Please refer to After Visit Summary for other counseling recommendations.      Verita Schneiders, MD, [place] for [employer], [place]

## 2020-05-31 NOTE — Patient Instructions (Signed)
Preventive Care 68 Years and Older, Female Preventive care refers to lifestyle choices and visits with your health care provider that can promote health and wellness. This includes:  A yearly physical exam. This is also called an annual wellness visit.  Regular dental and eye exams.  Immunizations.  Screening for certain conditions.  Healthy lifestyle choices, such as: ? Eating a healthy diet. ? Getting regular exercise. ? Not using drugs or products that contain nicotine and tobacco. ? Limiting alcohol use. What can I expect for my preventive care visit? Physical exam Your health care provider will check your:  Height and weight. These may be used to calculate your BMI (body mass index). BMI is a measurement that tells if you are at a healthy weight.  Heart rate and blood pressure.  Body temperature.  Skin for abnormal spots. Counseling Your health care provider may ask you questions about your:  Past medical problems.  Family's medical history.  Alcohol, tobacco, and drug use.  Emotional well-being.  Home life and relationship well-being.  Sexual activity.  Diet, exercise, and sleep habits.  History of falls.  Memory and ability to understand (cognition).  Work and work Statistician.  Pregnancy and menstrual history.  Access to firearms. What immunizations do I need? Vaccines are usually given at various ages, according to a schedule. Your health care provider will recommend vaccines for you based on your age, medical history, and lifestyle or other factors, such as travel or where you work.   What tests do I need? Blood tests  Lipid and cholesterol levels. These may be checked every 5 years, or more often depending on your overall health.  Hepatitis C test.  Hepatitis B test. Screening  Lung cancer screening. You may have this screening every year starting at age 68 if you have a 30-pack-year history of smoking and currently smoke or have quit within  the past 15 years.  Colorectal cancer screening. ? All adults should have this screening starting at age 68 and continuing until age 68. ? Your health care provider may recommend screening at age 68 if you are at increased risk. ? You will have tests every 1-10 years, depending on your results and the type of screening test.  Diabetes screening. ? This is done by checking your blood sugar (glucose) after you have not eaten for a while (fasting). ? You may have this done every 1-3 years.  Mammogram. ? This may be done every 1-2 years. ? Talk with your health care provider about how often you should have regular mammograms.  Abdominal aortic aneurysm (AAA) screening. You may need this if you are a current or former smoker.  BRCA-related cancer screening. This may be done if you have a family history of breast, ovarian, tubal, or peritoneal cancers. Other tests  STD (sexually transmitted disease) testing, if you are at risk.  Bone density scan. This is done to screen for osteoporosis. You may have this done starting at age 68. Talk with your health care provider about your test results, treatment options, and if necessary, the need for more tests. Follow these instructions at home: Eating and drinking  Eat a diet that includes fresh fruits and vegetables, whole grains, lean protein, and low-fat dairy products. Limit your intake of foods with high amounts of sugar, saturated fats, and salt.  Take vitamin and mineral supplements as recommended by your health care provider.  Do not drink alcohol if your health care provider tells you not to drink.  If you drink alcohol: ? Limit how much you have to 0-1 drink a day. ? Be aware of how much alcohol is in your drink. In the [place] one drink equals one 12 oz bottle of beer (355 mL), one 5 oz glass of wine (148 mL), or one 1 oz glass of hard liquor (44 mL).   Lifestyle  Take daily care of your teeth and gums. Brush your teeth every morning  and night with fluoride toothpaste. Floss one time each day.  Stay active. Exercise for at least 30 minutes 5 or more days each week.  Do not use any products that contain nicotine or tobacco, such as cigarettes, e-cigarettes, and chewing tobacco. If you need help quitting, ask your health care provider.  Do not use drugs.  If you are sexually active, practice safe sex. Use a condom or other form of protection in order to prevent STIs (sexually transmitted infections).  Talk with your health care provider about taking a low-dose aspirin or statin.  Find healthy ways to cope with stress, such as: ? Meditation, yoga, or listening to music. ? Journaling. ? Talking to a trusted person. ? Spending time with friends and family. Safety  Always wear your seat belt while driving or riding in a vehicle.  Do not drive: ? If you have been drinking alcohol. Do not ride with someone who has been drinking. ? When you are tired or distracted. ? While texting.  Wear a helmet and other protective equipment during sports activities.  If you have firearms in your house, make sure you follow all gun safety procedures. What's next?  Visit your health care provider once a year for an annual wellness visit.  Ask your health care provider how often you should have your eyes and teeth checked.  Stay up to date on all vaccines. This information is not intended to replace advice given to you by your health care provider. Make sure you discuss any questions you have with your health care provider. Document Revised: 01/14/2020 Document Reviewed: 01/17/2018 Elsevier Patient Education  2021 Elsevier Inc.  

## 2020-07-06 ENCOUNTER — Other Ambulatory Visit: Payer: Self-pay

## 2020-07-06 MED ORDER — LOSARTAN POTASSIUM 100 MG PO TABS: 100.0000 mg | ORAL_TABLET | Freq: Every day | ORAL | 1 refills | Status: DC

## 2020-07-06 NOTE — Telephone Encounter (Signed)
Pt's medication was sent to pt's pharmacy as requested. Confirmation received.  °

## 2020-07-14 NOTE — Progress Notes (Signed)
Cardiology Office Note:    Date:  07/14/2020   ID:  Veronica [name], DOB November 29, 1950, MRN [id number]  PCP:  Hulan Fess, MD  Cardiologist:  Sinclair Grooms, MD   Referring MD: Hulan Fess, MD   No chief complaint on file.   History of Present Illness:    Veronica [name] is a 70 y.o. female with a hx of DM II, hyperlipidemia, hypertension, and mid to distal LAD drug-eluting stent placed 2015.  No cardiac complaints.  Imdur has taken away angina.  Ambulating without difficulty.  A1c is now greater than 8.  Denies nitroglycerin use.  Denies claudication.  No dyspnea.  Past Medical History:  Diagnosis Date   Angina, class IV ([place]) 01/2014   Anxiety    "menopausal related", history of   CAD S/P mLAD Bifurcation PCI: Resolute DES 2.25 mm x 18 mm(2.75 mm), 2.0 mm Angiosculpt of Diag  01/08/2014   [place] 0,1,1 midLAD-Diag lesion - progressed from Moderate to Severe 90% LAD, 70-80% Diag from 6-12 2015 --> Class IV Angina --> DES PCI with Angiosculpt PCI of Diag  FFR of m-dRCA 24-58% = 0.9    Complication of anesthesia    extreme sore throat and hoarseness after aneathesia, also "woke up screamimg" after surgery once"   Coronary artery disease involving native coronary artery with angina pectoris with documented spasm ([place]) 06/2013   h/o moderate 3 Vessel CAD with mLAD-Diag bifurcation ([place] 0,1,1)   Diabetes mellitus without complication ([place])    Essential hypertension    stress test 10 yrs ago.   pcp   dr Lennette Bihari little   GERD (gastroesophageal reflux disease)    Headache    Migraines childhood   History of migraine headaches    "as a child"   Hyperlipemia    Hyperthyroidism    "had radioactive iodine tx in the 1980's"   Hypothyroidism    Osteoarthritis    "joints ache"   Osteopenia     Past Surgical History:  Procedure Laterality Date   BACK SURGERY     BREAST CYST ASPIRATION Left 04/1999   "done in dr's office"   CARDIAC CATHETERIZATION  06/2013   CARDIAC CATHETERIZATION   01/08/2014   Procedure: INTRAVASCULAR PRESSURE WIRE/FFR STUDY;  Surgeon: Sinclair Grooms, MD;  Location: [facility] CATH LAB;  Service: Cardiovascular;;  distal RCA   CARDIAC CATHETERIZATION  01/08/2014   Procedure: CORONARY BALLOON ANGIOPLASTY;  Surgeon: Sinclair Grooms, MD;  Location: [facility] CATH LAB;  Service: Cardiovascular;;  diag   COLONOSCOPY     CORONARY ANGIOPLASTY WITH STENT PLACEMENT  01/08/2014   "1"   [place] N/A 03/24/2016   Procedure: DILATATION & CURETTAGE/HYSTEROSCOPY WITH MYOSURE;  Surgeon: Megan Salon, MD;  Location: [place] [facility];  Service: Gynecology;  Laterality: N/A;   DILATION AND CURETTAGE OF UTERUS     FRACTURE SURGERY     LAPAROSCOPIC CHOLECYSTECTOMY  4/97   LEFT HEART CATH AND CORONARY ANGIOGRAPHY N/A 11/19/2019   Procedure: LEFT HEART CATH AND CORONARY ANGIOGRAPHY;  Surgeon: Martinique, Peter M, MD;  Location: [place] CV LAB;  Service: Cardiovascular;  Laterality: N/A;   LEFT HEART CATHETERIZATION WITH CORONARY ANGIOGRAM N/A 07/04/2013   Procedure: LEFT HEART CATHETERIZATION WITH CORONARY ANGIOGRAM;  Surgeon: Sinclair Grooms, MD;  Location: [facility] CATH LAB;  Service: Cardiovascular;  Laterality: N/A;   LEFT HEART CATHETERIZATION WITH CORONARY ANGIOGRAM N/A 01/08/2014   Procedure: LEFT HEART CATHETERIZATION WITH CORONARY ANGIOGRAM;  Surgeon: Mallie Mussel  Carlye Grippe, MD;  Location: [facility] CATH LAB;  Service: Cardiovascular;  Laterality: N/A;   MOUTH SURGERY     PERCUTANEOUS STENT INTERVENTION  01/08/2014   Procedure: PERCUTANEOUS STENT INTERVENTION;  Surgeon: Sinclair Grooms, MD;  Location: [facility] CATH LAB;  Service: Cardiovascular;;  MID LAD   POSTERIOR CERVICAL FUSION/FORAMINOTOMY  04/04/2011   Procedure: POSTERIOR CERVICAL FUSION/FORAMINOTOMY LEVEL 1;  Surgeon: Charlie Pitter, MD;  Location: [place] [facility];  Service: Neurosurgery;  Laterality: Left;  Left Cervical Six-Seven Laminectomy, Foraminotomy, Diskectomy    TONSILLECTOMY     WISDOM TOOTH EXTRACTION      WRIST FRACTURE SURGERY Right 1990's    Current Medications: No outpatient medications have been marked as taking for the 07/16/20 encounter (Appointment) with Belva Crome, MD.     Allergies:   Ace inhibitors, Prilosec [omeprazole magnesium], Sulfa antibiotics, and Ultram [tramadol hcl]   Social History   Socioeconomic History   Marital status: Married    Spouse name: Not on file   Number of children: Not on file   Years of education: Not on file   Highest education level: Not on file  Occupational History   Not on file  Tobacco Use   Smoking status: Never Smoker   Smokeless tobacco: Never Used  Vaping Use   Vaping Use: Never used  Substance and Sexual Activity   Alcohol use: No   Drug use: No   Sexual activity: Yes    Partners: Male    Birth control/protection: Post-menopausal  Other Topics Concern   Not on file  Social History Narrative   Not on file   Social Determinants of Health   Financial Resource Strain: Not on file  Food Insecurity: Not on file  Transportation Needs: Not on file  Physical Activity: Not on file  Stress: Not on file  Social Connections: Not on file     Family History: The patient's family history includes Asthma in her sister; Diabetes type II in her father; Heart Problems in her sister; Heart disease in her father and mother; Hypertension in her brother, father, mother, sister, and sister.  ROS:   Please see the history of present illness.    No complaints all other systems reviewed and are negative.  EKGs/Labs/Other Studies Reviewed:    The following studies were reviewed today: No new data  EKG:  EKG a new tracing is not performed  Recent Labs: 11/18/2019: Magnesium 2.2; TSH 6.387 11/19/2019: BUN 17; Creatinine, Ser 0.80; Hemoglobin 13.3; Platelets 161; Potassium 3.8; Sodium 139  Recent Lipid Panel    Component Value Date/Time   CHOL 100 11/19/2019 0258   TRIG 100 11/19/2019 0258   HDL 42 11/19/2019 0258   CHOLHDL 2.4  11/19/2019 0258   VLDL 20 11/19/2019 0258   LDLCALC 38 11/19/2019 0258    Physical Exam:    VS:  LMP 02/06/2010 Comment: bleeding 12-14-2018    Wt Readings from Last 3 Encounters:  05/31/20 170 lb 12.8 oz (77.5 kg)  01/16/20 171 lb (77.6 kg)  11/28/19 196 lb (88.9 kg)     GEN: Moderate obesity. No acute distress HEENT: Normal NECK: No JVD. LYMPHATICS: No lymphadenopathy CARDIAC: No murmur. RRR no gallop, or edema. VASCULAR:  Normal Pulses. No bruits. RESPIRATORY:  Clear to auscultation without rales, wheezing or rhonchi  ABDOMEN: Soft, non-tender, non-distended, No pulsatile mass, MUSCULOSKELETAL: No deformity  SKIN: Warm and dry NEUROLOGIC:  Alert and oriented x 3 PSYCHIATRIC:  Normal affect   ASSESSMENT:  1. CAD S/P mLAD Bifurcation PCI: Resolute DES 2.25 mm x 18 mm(2.75 mm), 2.0 mm Angiosculpt of Diag    2. Essential hypertension   3. Mixed hyperlipidemia   4. NSVT (nonsustained ventricular tachycardia) (HCC)    PLAN:    In order of problems listed above:  Secondary prevention discussed.  A1c less than 7, blood pressure less than 130/80 mmHg, and LDL less than 70. Excellent blood pressure control on current therapy Continue current statin dose No arrhythmias Overall education and awareness concerning secondary risk prevention was discussed in detail: LDL less than 70, hemoglobin A1c less than 7, blood pressure target less than 130/80 mmHg, >150 minutes of moderate aerobic activity per week, avoidance of smoking, weight control (via diet and exercise), and continued surveillance/management of/for obstructive sleep apnea.    Medication Adjustments/Labs and Tests Ordered: Current medicines are reviewed at length with the patient today.  Concerns regarding medicines are outlined above.  No orders of the defined types were placed in this encounter.  No orders of the defined types were placed in this encounter.   There are no Patient Instructions on file for this  visit.   Signed, Sinclair Grooms, MD  07/14/2020 3:48 PM    [facility]

## 2020-07-16 ENCOUNTER — Other Ambulatory Visit: Payer: Self-pay

## 2020-07-16 ENCOUNTER — Ambulatory Visit (INDEPENDENT_AMBULATORY_CARE_PROVIDER_SITE_OTHER): Payer: Medicare Other | Admitting: Interventional Cardiology

## 2020-07-16 ENCOUNTER — Encounter: Payer: Self-pay | Admitting: Interventional Cardiology

## 2020-07-16 VITALS — BP 114/76 | HR 68 | Ht 64.0 in | Wt 170.0 lb

## 2020-07-16 DIAGNOSIS — I10 Essential (primary) hypertension: Secondary | ICD-10-CM

## 2020-07-16 DIAGNOSIS — I472 Ventricular tachycardia: Secondary | ICD-10-CM

## 2020-07-16 DIAGNOSIS — E782 Mixed hyperlipidemia: Secondary | ICD-10-CM

## 2020-07-16 DIAGNOSIS — I2510 Atherosclerotic heart disease of native coronary artery without angina pectoris: Secondary | ICD-10-CM

## 2020-07-16 DIAGNOSIS — Z9861 Coronary angioplasty status: Secondary | ICD-10-CM

## 2020-07-16 DIAGNOSIS — I4729 Other ventricular tachycardia: Secondary | ICD-10-CM

## 2020-07-16 NOTE — Patient Instructions (Signed)
Medication Instructions:  Your physician recommends that you continue on your current medications as directed. Please refer to the Current Medication list given to you today.  *If you need a refill on your cardiac medications before your next appointment, please call your pharmacy*   Lab Work: NONE If you have labs (blood work) drawn today and your tests are completely normal, you will receive your results only by: [place] (if you have MyChart) OR A paper copy in the mail If you have any lab test that is abnormal or we need to change your treatment, we will call you to review the results.   Testing/Procedures: NONE   Follow-Up: At [facility], you and your health needs are our priority.  As part of our continuing mission to provide you with exceptional heart care, we have created designated Provider Care Teams.  These Care Teams include your primary Cardiologist (physician) and Advanced Practice Providers (APPs -  Physician Assistants and Nurse Practitioners) who all work together to provide you with the care you need, when you need it.  We recommend signing up for the patient portal called "MyChart".  Sign up information is provided on this After Visit Summary.  MyChart is used to connect with patients for Virtual Visits (Telemedicine).  Patients are able to view lab/test results, encounter notes, upcoming appointments, etc.  Non-urgent messages can be sent to your provider as well.   To learn more about what you can do with MyChart, go to [email or web address].    Your next appointment:   1 year(s)  The format for your next appointment:   In Person  Provider:   Rudean Haskell, MD

## 2020-08-25 ENCOUNTER — Other Ambulatory Visit: Payer: Self-pay | Admitting: Gastroenterology

## 2020-08-25 ENCOUNTER — Ambulatory Visit
Admission: RE | Admit: 2020-08-25 | Discharge: 2020-08-25 | Disposition: A | Discharge status: Home / Self Care | Payer: Medicare Other | Source: Ambulatory Visit | Attending: Gastroenterology | Admitting: Gastroenterology

## 2020-08-25 ENCOUNTER — Other Ambulatory Visit: Payer: Self-pay

## 2020-08-25 DIAGNOSIS — R197 Diarrhea, unspecified: Secondary | ICD-10-CM

## 2020-08-25 DIAGNOSIS — K5900 Constipation, unspecified: Secondary | ICD-10-CM

## 2020-08-27 ENCOUNTER — Telehealth: Payer: Self-pay | Admitting: Podiatry

## 2020-08-27 NOTE — Telephone Encounter (Signed)
Called and spoke with the patient and relayed the message and stated that if the medicine does not do any better we should have the patient to come in and see Dr Jacqualyn Posey and patient agreed. Lattie Haw

## 2020-08-27 NOTE — Telephone Encounter (Signed)
Patient is requesting a refill on the following:   [facility]:  Antifungal topical - Terbinafine 3%, Fluconazole 2%, Tea Tree Oil 5%, Urea 10%, Ibuprofen 2%, in #75m DMSO suspension. Apply to affected toenail(s) once daily (at bedtime) or twice daily

## 2020-09-02 LAB — HM DIABETES EYE EXAM

## 2020-09-08 ENCOUNTER — Telehealth: Payer: Self-pay | Admitting: *Deleted

## 2020-09-08 NOTE — Telephone Encounter (Signed)
Sent over a RX refill request per Dr Jacqualyn Posey and no refills due to patient has not seen Dr Jacqualyn Posey in a year and patient would need to come back in and patient is aware of this. Veronica [name]

## 2020-12-10 ENCOUNTER — Other Ambulatory Visit: Payer: Self-pay | Admitting: Cardiology

## 2021-03-18 ENCOUNTER — Other Ambulatory Visit: Payer: Self-pay | Admitting: Interventional Cardiology

## 2021-04-02 ENCOUNTER — Encounter: Payer: Self-pay | Admitting: Radiology

## 2021-06-17 ENCOUNTER — Encounter: Payer: Self-pay | Admitting: Family Medicine

## 2021-06-17 ENCOUNTER — Ambulatory Visit (INDEPENDENT_AMBULATORY_CARE_PROVIDER_SITE_OTHER): Payer: Medicare Other | Admitting: Family Medicine

## 2021-06-17 ENCOUNTER — Ambulatory Visit (INDEPENDENT_AMBULATORY_CARE_PROVIDER_SITE_OTHER)
Admission: RE | Admit: 2021-06-17 | Discharge: 2021-06-17 | Disposition: A | Discharge status: Home / Self Care | Payer: Medicare Other | Source: Ambulatory Visit | Attending: Family Medicine | Admitting: Family Medicine

## 2021-06-17 VITALS — BP 110/70 | HR 75 | Temp 97.5°F | Ht 64.0 in | Wt 167.5 lb

## 2021-06-17 DIAGNOSIS — G4700 Insomnia, unspecified: Secondary | ICD-10-CM

## 2021-06-17 DIAGNOSIS — K5730 Diverticulosis of large intestine without perforation or abscess without bleeding: Secondary | ICD-10-CM | POA: Insufficient documentation

## 2021-06-17 DIAGNOSIS — I2510 Atherosclerotic heart disease of native coronary artery without angina pectoris: Secondary | ICD-10-CM | POA: Diagnosis not present

## 2021-06-17 DIAGNOSIS — U099 Post covid-19 condition, unspecified: Secondary | ICD-10-CM | POA: Diagnosis not present

## 2021-06-17 DIAGNOSIS — E1169 Type 2 diabetes mellitus with other specified complication: Secondary | ICD-10-CM

## 2021-06-17 DIAGNOSIS — K760 Fatty (change of) liver, not elsewhere classified: Secondary | ICD-10-CM | POA: Insufficient documentation

## 2021-06-17 DIAGNOSIS — E039 Hypothyroidism, unspecified: Secondary | ICD-10-CM | POA: Diagnosis not present

## 2021-06-17 DIAGNOSIS — K219 Gastro-esophageal reflux disease without esophagitis: Secondary | ICD-10-CM | POA: Insufficient documentation

## 2021-06-17 DIAGNOSIS — M8580 Other specified disorders of bone density and structure, unspecified site: Secondary | ICD-10-CM | POA: Insufficient documentation

## 2021-06-17 DIAGNOSIS — I10 Essential (primary) hypertension: Secondary | ICD-10-CM

## 2021-06-17 DIAGNOSIS — N952 Postmenopausal atrophic vaginitis: Secondary | ICD-10-CM

## 2021-06-17 DIAGNOSIS — R053 Chronic cough: Secondary | ICD-10-CM

## 2021-06-17 DIAGNOSIS — E782 Mixed hyperlipidemia: Secondary | ICD-10-CM

## 2021-06-17 DIAGNOSIS — Z9861 Coronary angioplasty status: Secondary | ICD-10-CM

## 2021-06-17 DIAGNOSIS — K649 Unspecified hemorrhoids: Secondary | ICD-10-CM

## 2021-06-17 DIAGNOSIS — F419 Anxiety disorder, unspecified: Secondary | ICD-10-CM | POA: Insufficient documentation

## 2021-06-17 DIAGNOSIS — F329 Major depressive disorder, single episode, unspecified: Secondary | ICD-10-CM | POA: Insufficient documentation

## 2021-06-17 DIAGNOSIS — M47812 Spondylosis without myelopathy or radiculopathy, cervical region: Secondary | ICD-10-CM

## 2021-06-17 LAB — POCT GLYCOSYLATED HEMOGLOBIN (HGB A1C): Hemoglobin A1C: 6.3 % — AB (ref 4.0–5.6)

## 2021-06-17 NOTE — Assessment & Plan Note (Signed)
Lab Results  ?Component Value Date  ? [place] 38 11/19/2019  ? ?Controlled. Cont crestor 40 mg and zetia 10 mg ?

## 2021-06-17 NOTE — Assessment & Plan Note (Addendum)
Controlled. Cont metformin 500 mg bid. ?Lab Results  ?Component Value Date  ? HGBA1C 6.3 (A) 06/17/2021  ? ? ?

## 2021-06-17 NOTE — Assessment & Plan Note (Signed)
Responds to prn benadryl 12.5 mg ?

## 2021-06-17 NOTE — Assessment & Plan Note (Addendum)
Stable. On levo 88 mcg. Last checked in Jan and normal per patient.  ?

## 2021-06-17 NOTE — Assessment & Plan Note (Signed)
Follows with cardiology. On ASA 81 mg, crestor 40 mg, imdur 30 mg. Not currently with symptoms ?

## 2021-06-17 NOTE — Progress Notes (Signed)
r ? ?Subjective:  ? ?  ?Veronica [name] is a 71 y.o. female presenting for Establish Care (Will request eye exam ) ?  ? ? ?HPI ? ?#Post-covid cough ?- happened in December ?- started with covid ?- continues to have sore throat and irritation in the back of the throat ?- no longer with productive cough ?- worse at night ?- using throat lozenges  ? ? ?Review of Systems  ?Constitutional:  Negative for chills and fever.  ?HENT:  Positive for sore throat. Negative for rhinorrhea and sinus pain.   ?Eyes:  Negative for discharge and itching.  ?Respiratory:  Positive for cough.   ? ? ?Social History  ? ?Tobacco Use  ?Smoking Status Never  ?Smokeless Tobacco Never  ? ? ? ?   ?Objective:  ?  ?BP Readings from Last 3 Encounters:  ?06/17/21 110/70  ?07/16/20 114/76  ?05/31/20 135/82  ? ?Wt Readings from Last 3 Encounters:  ?06/17/21 167 lb 8 oz (76 kg)  ?07/16/20 170 lb (77.1 kg)  ?05/31/20 170 lb 12.8 oz (77.5 kg)  ? ? ?BP 110/70   Pulse 75   Temp (!) 97.5 ?F (36.4 ?C) (Temporal)   Ht '5\' 4"'$  (1.626 m)   Wt 167 lb 8 oz (76 kg)   LMP 02/06/2010 Comment: bleeding 12-14-2018  SpO2 97%   BMI 28.75 kg/m?  ? ? ?Physical Exam ?Constitutional:   ?   General: She is not in acute distress. ?   Appearance: She is well-developed. She is not diaphoretic.  ?HENT:  ?   Right Ear: External ear normal.  ?   Left Ear: External ear normal.  ?   Nose: Nose normal.  ?Eyes:  ?   Conjunctiva/sclera: Conjunctivae normal.  ?Cardiovascular:  ?   Rate and Rhythm: Normal rate and regular rhythm.  ?   Heart sounds: No murmur heard. ?Pulmonary:  ?   Effort: Pulmonary effort is normal. No respiratory distress.  ?   Breath sounds: Normal breath sounds. No wheezing or rales.  ?Musculoskeletal:  ?   Cervical back: Neck supple.  ?Skin: ?   General: Skin is warm and dry.  ?   Capillary Refill: Capillary refill takes less than 2 seconds.  ?Neurological:  ?   Mental Status: She is alert. Mental status is at baseline.  ?Psychiatric:     ?   Mood and Affect: Mood  normal.     ?   Behavior: Behavior normal.  ? ? ?CXR (my read): no effusion, no consolidation, no acute finding ? ? ?   ?Assessment & Plan:  ? ?Problem List Items Addressed This Visit   ? ?  ? Cardiovascular and Mediastinum  ? Essential hypertension (Chronic)  ?  Well controlled. Cont losartan 100 mg and imdur 30 mg and metoprolol 25 mg ? ?  ?  ? CAD S/P mLAD Bifurcation PCI: Resolute DES 2.25 mm x 18 mm(2.75 mm), 2.0 mm Angiosculpt of Diag  - Primary  ?  Follows with cardiology. On ASA 81 mg, crestor 40 mg, imdur 30 mg. Not currently with symptoms ? ?  ?  ? Hemorrhoids  ?  Follows with GI. Prn anusol ? ?  ?  ?  ? Digestive  ? Gastro-esophageal reflux disease without esophagitis  ?  Controlled on famotidine as needed ? ?  ?  ?  ? Endocrine  ? Hypothyroidism  ?  Stable. On levo 88 mcg. Last checked in Jan and normal per patient.  ? ?  ?  ?  Type 2 diabetes mellitus with other specified complication ([place])  ?  Controlled. Cont metformin 500 mg bid. ?Lab Results  ?Component Value Date  ? HGBA1C 6.3 (A) 06/17/2021  ? ? ?  ?  ? Relevant Orders  ? POCT glycosylated hemoglobin (Hb A1C) (Completed)  ?  ? Musculoskeletal and Integument  ? Cervical spondylosis without myelopathy  ?  Takes flexeril 10 mg as needed ? ?  ?  ?  ? Genitourinary  ? Vaginal atrophy  ?  On estrogen cream. Helping with symptoms ? ?  ?  ?  ? Other  ? Hyperlipidemia (Chronic)  ?  Lab Results  ?Component Value Date  ? [place] 38 11/19/2019  ?Controlled. Cont crestor 40 mg and zetia 10 mg ?  ?  ? Insomnia  ?  Responds to prn benadryl 12.5 mg ? ?  ?  ? Post-COVID chronic cough  ?  5 months ago with persistent cough. CXR reassuring, we will follow-up final read.  Discussed trial of 24-hour antihistamine for possible allergy component.  If ineffective patient with acid reflux history, discussed possibility of silent GERD and trial of daily famotidine.  If neither are effective we will consider referral to pulmonology for further evaluation. ? ?  ?  ? Relevant  Orders  ? DG Chest 2 View  ? ? ? ?Return in about 3 months (around 09/17/2021) for diabetes. ? ?Lesleigh Noe, MD ? ? ? ?

## 2021-06-17 NOTE — Assessment & Plan Note (Signed)
On estrogen cream. Helping with symptoms ?

## 2021-06-17 NOTE — Assessment & Plan Note (Signed)
Well controlled. Cont losartan 100 mg and imdur 30 mg and metoprolol 25 mg ?

## 2021-06-17 NOTE — Assessment & Plan Note (Signed)
Controlled on famotidine as needed ?

## 2021-06-17 NOTE — Assessment & Plan Note (Signed)
5 months ago with persistent cough. CXR reassuring, we will follow-up final read.  Discussed trial of 24-hour antihistamine for possible allergy component.  If ineffective patient with acid reflux history, discussed possibility of silent GERD and trial of daily famotidine.  If neither are effective we will consider referral to pulmonology for further evaluation. ?

## 2021-06-17 NOTE — Assessment & Plan Note (Signed)
Follows with GI. Prn anusol ?

## 2021-06-17 NOTE — Assessment & Plan Note (Signed)
Takes flexeril 10 mg as needed ?

## 2021-06-17 NOTE — Patient Instructions (Signed)
Cough ?- chest x-ray ?- 1-2 weeks trial ?--- Allergy medication: claritin/zyrtec - store brand ok ?-- if no effect ?- take famotidine twice daily for 1-2 weeks  ? ?If no change call or mychart ? ? ?

## 2021-06-19 ENCOUNTER — Other Ambulatory Visit: Payer: Self-pay | Admitting: Internal Medicine

## 2021-06-23 ENCOUNTER — Encounter: Payer: Self-pay | Admitting: Family Medicine

## 2021-08-29 ENCOUNTER — Other Ambulatory Visit: Payer: Self-pay | Admitting: Family Medicine

## 2021-08-29 ENCOUNTER — Ambulatory Visit (INDEPENDENT_AMBULATORY_CARE_PROVIDER_SITE_OTHER): Payer: Medicare Other | Admitting: Podiatry

## 2021-08-29 DIAGNOSIS — B351 Tinea unguium: Secondary | ICD-10-CM

## 2021-08-29 DIAGNOSIS — E119 Type 2 diabetes mellitus without complications: Secondary | ICD-10-CM

## 2021-08-29 NOTE — Progress Notes (Signed)
Subjective:   Patient ID: Veronica [name], female   DOB: 70 y.o.   MRN: [id number]   HPI 71 year old female presents the office today for concerns of nail fungus mostly on her right big toe, left fifth toenails.  She has been using the compound cream and she thinks the other nails have gotten better.  She has no pain in the nails and no swelling redness or any drainage.  No open lesions.  From a diabetes standpoint no recent ulcerations and there is no numbness or tingling.  A1c 6.3 on 06/17/2021  Review of Systems  All other systems reviewed and are negative.  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   Anxiety    "menopausal related", history of   CAD S/P mLAD Bifurcation PCI: Resolute DES 2.25 mm x 18 mm(2.75 mm), 2.0 mm Angiosculpt of Diag  01/08/2014   [place] 0,1,1 midLAD-Diag lesion - progressed from Moderate to Severe 90% LAD, 70-80% Diag from 6-12 2015 --> Class IV Angina --> DES PCI with Angiosculpt PCI of Diag  FFR of m-dRCA 17-00% = 0.9    Complication of anesthesia    extreme sore throat and hoarseness after aneathesia, also "woke up screamimg" after surgery once"   Coronary artery disease involving native coronary artery with angina pectoris with documented spasm ([place]) 06/2013   h/o moderate 3 Vessel CAD with mLAD-Diag bifurcation ([place] 0,1,1)   Diabetes mellitus without complication ([place])    Essential hypertension    stress test 10 yrs ago.   pcp   dr Lennette Bihari little   GERD (gastroesophageal reflux disease)    Headache    Migraines childhood   History of migraine headaches    "as a child"   Hyperlipemia    Hyperthyroidism    "had radioactive iodine tx in the 1980's"   Hypothyroidism    Osteoarthritis    "joints ache"   Osteopenia     Past Surgical History:  Procedure Laterality Date   BACK SURGERY     BREAST CYST ASPIRATION Left 04/1999   "done in dr's office"   CARDIAC CATHETERIZATION  06/2013   CARDIAC CATHETERIZATION  01/08/2014    Procedure: INTRAVASCULAR PRESSURE WIRE/FFR STUDY;  Surgeon: Sinclair Grooms, MD;  Location: [facility] CATH LAB;  Service: Cardiovascular;;  distal RCA   CARDIAC CATHETERIZATION  01/08/2014   Procedure: CORONARY BALLOON ANGIOPLASTY;  Surgeon: Sinclair Grooms, MD;  Location: [facility] CATH LAB;  Service: Cardiovascular;;  diag   COLONOSCOPY     CORONARY ANGIOPLASTY WITH STENT PLACEMENT  01/08/2014   "1"   [place] N/A 03/24/2016   Procedure: DILATATION & CURETTAGE/HYSTEROSCOPY WITH MYOSURE;  Surgeon: Megan Salon, MD;  Location: [place];  Service: Gynecology;  Laterality: N/A;   DILATION AND CURETTAGE OF UTERUS     FRACTURE SURGERY     LAPAROSCOPIC CHOLECYSTECTOMY  4/97   LEFT HEART CATH AND CORONARY ANGIOGRAPHY N/A 11/19/2019   Procedure: LEFT HEART CATH AND CORONARY ANGIOGRAPHY;  Surgeon: Martinique, Peter M, MD;  Location: [facility] CV LAB;  Service: Cardiovascular;  Laterality: N/A;   LEFT HEART CATHETERIZATION WITH CORONARY ANGIOGRAM N/A 07/04/2013   Procedure: LEFT HEART CATHETERIZATION WITH CORONARY ANGIOGRAM;  Surgeon: Sinclair Grooms, MD;  Location: [facility] CATH LAB;  Service: Cardiovascular;  Laterality: N/A;   LEFT HEART CATHETERIZATION WITH CORONARY ANGIOGRAM N/A 01/08/2014   Procedure: LEFT HEART CATHETERIZATION WITH CORONARY ANGIOGRAM;  Surgeon: Sinclair Grooms, MD;  Location: [place] [facility] CATH LAB;  Service: Cardiovascular;  Laterality: N/A;   MOUTH SURGERY     PERCUTANEOUS STENT INTERVENTION  01/08/2014   Procedure: PERCUTANEOUS STENT INTERVENTION;  Surgeon: Sinclair Grooms, MD;  Location: [facility] CATH LAB;  Service: Cardiovascular;;  MID LAD   POSTERIOR CERVICAL FUSION/FORAMINOTOMY  04/04/2011   Procedure: POSTERIOR CERVICAL FUSION/FORAMINOTOMY LEVEL 1;  Surgeon: Charlie Pitter, MD;  Location: [place] [facility]UR[facility] ORS;  Service: Neurosurgery;  Laterality: Left;  Left Cervical Six-Seven Laminectomy, Foraminotomy, Diskectomy    TONSILLECTOMY     WISDOM TOOTH EXTRACTION     WRIST  FRACTURE SURGERY Right 1990's     Current Outpatient Medications:    aspirin EC 81 MG EC tablet, Take 1 tablet (81 mg total) by mouth daily. Swallow whole., Disp: 90 tablet, Rfl: 3   augmented betamethasone dipropionate (DIPROLENE-AF) 0.05 % cream, Apply 1 application topically daily as needed (for skin irritation). , Disp: , Rfl:    cyclobenzaprine (FLEXERIL) 10 MG tablet, Take 10 mg by mouth 3 (three) times daily as needed for muscle spasms. , Disp: , Rfl: 0   diphenhydrAMINE (BENADRYL) 25 mg capsule, Take 25 mg by mouth every 6 (six) hours as needed for sleep., Disp: , Rfl:    estradiol (ESTRACE) 0.1 MG/GM vaginal cream, Place 1 Applicatorful vaginally daily as needed., Disp: 42.5 g, Rfl: 5   ezetimibe (ZETIA) 10 MG tablet, TAKE 1 TABLET BY MOUTH EVERY DAY, Disp: 90 tablet, Rfl: 2   famotidine (PEPCID) 20 MG tablet, Take 20 mg by mouth 2 (two) times daily as needed for heartburn or indigestion., Disp: , Rfl:    fish oil-omega-3 fatty acids 1000 MG capsule, Take 1 g by mouth daily., Disp: , Rfl:    halobetasol (ULTRAVATE) 0.05 % cream, Apply 1 application topically daily as needed (for skin irritation). , Disp: , Rfl:    hydrocortisone (ANUSOL-HC) 2.5 % rectal cream, Place 1 application rectally 2 (two) times daily. As needed, Disp: 30 g, Rfl: 5   hydrocortisone valerate cream (WESTCORT) 0.2 %, Apply 1 application topically 2 (two) times daily as needed (rash). , Disp: , Rfl:    isosorbide mononitrate (IMDUR) 30 MG 24 hr tablet, TAKE 1 TABLET BY MOUTH EVERY DAY, Disp: 90 tablet, Rfl: 3   levothyroxine (SYNTHROID, LEVOTHROID) 88 MCG tablet, Take 100 mcg by mouth daily before breakfast., Disp: , Rfl:    losartan (COZAAR) 100 MG tablet, Take 1 tablet (100 mg total) by mouth daily., Disp: 90 tablet, Rfl: 1   metFORMIN (GLUCOPHAGE) 500 MG tablet, Take 1 tablet by mouth 2 (two) times daily., Disp: , Rfl:    metoprolol tartrate (LOPRESSOR) 25 MG tablet, TAKE 1/2 TABLET BY MOUTH EVERY DAY, Disp: 45  tablet, Rfl: 0   Multiple Vitamins-Minerals (PRESERVISION AREDS PO), Take 1 capsule by mouth daily., Disp: , Rfl:    nitroGLYCERIN (NITROSTAT) 0.4 MG SL tablet, PLACE 1 TABLET (0.4 MG TOTAL) UNDER THE TONGUE EVERY 5 (FIVE) MINUTES AS NEEDED FOR CHEST PAIN., Disp: 25 tablet, Rfl: 3   NONFORMULARY OR COMPOUNDED ITEM, [facility]:  Antifungal topical - Terbinafine 3%, Fluconazole 2%, Tea Tree Oil 5%, Urea 10%, Ibuprofen 2%, in #93m DMSO suspension. Apply to affected toenail(s) once daily (at bedtime) or twice daily, Disp: 30 each, Rfl: 11   ONETOUCH VERIO test strip, 1 each by Other route every other day. , Disp: , Rfl:    rosuvastatin (CRESTOR) 40 MG tablet, Take 40 mg by mouth daily., Disp: , Rfl:   Allergies  Allergen Reactions   Ace Inhibitors Cough   Prilosec [Omeprazole Magnesium] Other (See Comments)    Blisters and skin peels off   Sulfa Antibiotics Other (See Comments)    Blisters and skin peels off   Ultram [Tramadol Hcl]     Lapse of memory            Objective:  Physical Exam  General: AAO x3, NAD  Dermatological: The right hallux, left foot the nails are mostly hypertrophic, dystrophic nail, brown discoloration to the other nails are discolored with yellow discoloration but there is not significant hypertrophy of the nail.  No open lesions.  Vascular: Dorsalis Pedis artery and Posterior Tibial artery pedal pulses are 2/4 bilateral with immedate capillary fill time. There is no pain with calf compression, swelling, warmth, erythema.   Neruologic: Grossly intact via light touch bilateral.   Musculoskeletal: No gross boney pedal deformities bilateral. No pain, crepitus, or limitation noted with foot and ankle range of motion bilateral. Muscular strength 5/5 in all groups tested bilateral.  Gait: Unassisted, Nonantalgic.       Assessment:   Onychomycosis, diabetic foot exam     Plan:  -Treatment options discussed including all alternatives, risks, and  complications -Etiology of symptoms were discussed -As a courtesy debrided the nails x10 without any complications or bleeding. -Continue compound cream-he does not need a refill today. -Add urea nail gel -Biotin supplement to help assist the nail growth  Trula Slade DPM

## 2021-09-09 LAB — HM DIABETES EYE EXAM

## 2021-09-12 ENCOUNTER — Encounter: Payer: Self-pay | Admitting: Family Medicine

## 2021-09-19 ENCOUNTER — Ambulatory Visit: Payer: Medicare Other | Admitting: Family Medicine

## 2021-09-19 ENCOUNTER — Ambulatory Visit (INDEPENDENT_AMBULATORY_CARE_PROVIDER_SITE_OTHER): Payer: Medicare Other | Admitting: Family Medicine

## 2021-09-19 ENCOUNTER — Encounter: Payer: Self-pay | Admitting: Family Medicine

## 2021-09-19 VITALS — BP 122/80 | HR 90 | Temp 97.7°F | Wt 166.2 lb

## 2021-09-19 DIAGNOSIS — E1169 Type 2 diabetes mellitus with other specified complication: Secondary | ICD-10-CM

## 2021-09-19 DIAGNOSIS — I10 Essential (primary) hypertension: Secondary | ICD-10-CM | POA: Diagnosis not present

## 2021-09-19 DIAGNOSIS — R053 Chronic cough: Secondary | ICD-10-CM | POA: Insufficient documentation

## 2021-09-19 DIAGNOSIS — R319 Hematuria, unspecified: Secondary | ICD-10-CM

## 2021-09-19 DIAGNOSIS — N3001 Acute cystitis with hematuria: Secondary | ICD-10-CM

## 2021-09-19 LAB — POC URINALSYSI DIPSTICK (AUTOMATED)
Bilirubin, UA: POSITIVE
Blood, UA: POSITIVE
Glucose, UA: NEGATIVE
Ketones, UA: NEGATIVE
Leukocytes, UA: NEGATIVE
Nitrite, UA: NEGATIVE
Protein, UA: POSITIVE — AB
Spec Grav, UA: >=1.03 — AB (ref 1.010–1.025)
Urobilinogen, UA: 0.2 E.U./dL
pH, UA: 5 (ref 5.0–8.0)

## 2021-09-19 LAB — POCT GLYCOSYLATED HEMOGLOBIN (HGB A1C): Hemoglobin A1C: 6.3 % — AB (ref 4.0–5.6)

## 2021-09-19 MED ORDER — AMOXICILLIN-POT CLAVULANATE 875-125 MG PO TABS: 1.0000 | ORAL_TABLET | Freq: Two times a day (BID) | ORAL | 0 refills | Status: AC

## 2021-09-19 NOTE — Assessment & Plan Note (Signed)
At goal. Cont Imdur 30 mg and losartan 100 mg and metoprolol 25 mg

## 2021-09-19 NOTE — Patient Instructions (Addendum)
Cough - try allegra, zyrtec, claritin - for 2 weeks - update if no improvement - I will touch base with the pharmacist about options for PPI - if no improvement - will plan pulmonology   Urine - start antibiotic - this is also a sinus medication so it could help with cough

## 2021-09-19 NOTE — Assessment & Plan Note (Addendum)
Potential for GERD given burning throat sensation. However, no improvement with famotidine increase. 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 Trial of Allergy medication. If no improvement update and given onset with Covid will refer to Dr. Loanne Drilling with pulm.

## 2021-09-19 NOTE — Assessment & Plan Note (Signed)
Lab Results  Component Value Date   HGBA1C 6.3 (A) 09/19/2021   At goal. Cont metformin 500 mg bid

## 2021-09-19 NOTE — Addendum Note (Signed)
Addended by: Ellamae Sia on: 09/19/2021 02:02 PM   Modules accepted: Orders

## 2021-09-19 NOTE — Progress Notes (Signed)
Subjective:     Veronica [name] is a 71 y.o. female presenting for Follow-up (DM ), Urinary Urgency, and Hematuria (Over last 2 days )   Husband went into cardiac arrest and is in intensive care and does not think he will make it  HPI  #Diabetes Currently taking metformin (Glucophage, [place])  Using medications without difficulties: Yes  Last HgbA1c:  Lab Results  Component Value Date   HGBA1C 6.3 (A) 09/19/2021    Diabetes Health Maintenance Due:    Diabetes Health Maintenance Due  Topic Date Due   HEMOGLOBIN A1C  03/22/2022   FOOT EXAM  08/30/2022   OPHTHALMOLOGY EXAM  09/10/2022   Hx of hemorrhagia cystitis Has been having some urinary incontinence - dribble on the way to the bathroom Just in the last week Some nausea for a few days Some low abdominal tenderness No vaginal symptoms   #Cough - saw her in 06/2021 - cxr was normal - continues to have cough and throat irritation - tried to increase famotidine - gets hoarse with prolonged speaking - no sinus symptoms - feels like the top - burning sensation  - wearing a pad and it was pink tinged and small amount with wiping  Review of Systems   Social History   Tobacco Use  Smoking Status Never  Smokeless Tobacco Never        Objective:    BP Readings from Last 3 Encounters:  09/19/21 122/80  06/17/21 110/70  07/16/20 114/76   Wt Readings from Last 3 Encounters:  09/19/21 166 lb 4 oz (75.4 kg)  06/17/21 167 lb 8 oz (76 kg)  07/16/20 170 lb (77.1 kg)    BP 122/80   Pulse 90   Temp 97.7 F (36.5 C) (Temporal)   Wt 166 lb 4 oz (75.4 kg)   LMP 02/06/2010 Comment: bleeding 12-14-2018  SpO2 98%   BMI 28.54 kg/m    Physical Exam Constitutional:      General: She is not in acute distress.    Appearance: She is well-developed. She is not diaphoretic.  HENT:     Right Ear: External ear normal.     Left Ear: External ear normal.     Nose: Nose normal.  Eyes:     Conjunctiva/sclera:  Conjunctivae normal.  Cardiovascular:     Rate and Rhythm: Normal rate and regular rhythm.     Heart sounds: No murmur heard. Pulmonary:     Effort: Pulmonary effort is normal. No respiratory distress.     Breath sounds: Normal breath sounds. No wheezing.  Abdominal:     General: Abdomen is flat. Bowel sounds are normal. There is no distension.     Palpations: Abdomen is soft.     Tenderness: There is abdominal tenderness in the suprapubic area and left lower quadrant. There is no right CVA tenderness or left CVA tenderness.  Musculoskeletal:     Cervical back: Neck supple.  Skin:    General: Skin is warm and dry.     Capillary Refill: Capillary refill takes less than 2 seconds.  Neurological:     Mental Status: She is alert. Mental status is at baseline.  Psychiatric:        Mood and Affect: Mood normal.        Behavior: Behavior normal.     UA: +Blood, neg LE and neg nitrites      Assessment & Plan:   Problem List Items Addressed This Visit  Cardiovascular and Mediastinum   Essential hypertension (Chronic)    At goal. Cont Imdur 30 mg and losartan 100 mg and metoprolol 25 mg        Endocrine   Type 2 diabetes mellitus with other specified complication ([facility]) - Primary    Lab Results  Component Value Date   HGBA1C 6.3 (A) 09/19/2021  At goal. Cont metformin 500 mg bid      Relevant Orders   POCT glycosylated hemoglobin (Hb A1C) (Completed)     Other   Chronic cough    Potential for GERD given burning throat sensation. However, no improvement with famotidine increase. 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 Trial of Allergy medication. If no improvement update and given onset with Covid will refer to Dr. Loanne Drilling with pulm.       Other Visit Diagnoses     Hematuria, unspecified type       Relevant Orders   POCT Urinalysis Dipstick (Automated) (Completed)   Urine Culture   Urine Microscopic   Acute cystitis with hematuria       Relevant  Medications   amoxicillin-clavulanate (AUGMENTIN) 875-125 MG tablet      Urine with blood, and several symptoms consistent with a UTI.  We will treat with Augmentin given sore throat and cough for possible sinus related overlap.  Follow-up urine culture and microscopic evaluation to further evaluate blood.  If no improvement.  Return if symptoms worsen or fail to improve.  Lesleigh Noe, MD

## 2021-09-21 ENCOUNTER — Telehealth: Payer: Self-pay | Admitting: Family Medicine

## 2021-09-21 ENCOUNTER — Encounter: Payer: Self-pay | Admitting: Family Medicine

## 2021-09-21 LAB — URINE CULTURE
MICRO NUMBER:: 13775199
SPECIMEN QUALITY:: ADEQUATE

## 2021-09-21 MED ORDER — LOSARTAN POTASSIUM 100 MG PO TABS: 100.0000 mg | ORAL_TABLET | Freq: Every day | ORAL | 1 refills | Status: DC

## 2021-09-21 NOTE — Telephone Encounter (Signed)
  Encourage patient to contact the pharmacy for refills or they can request refills through [facility]  Did the patient contact the pharmacy:  n   LAST APPOINTMENT DATE:  09/19/21  NEXT APPOINTMENT DATE:12/09/21  MEDICATION:losartan (COZAAR) 100 MG tablet  Is the patient out of medication? n  If not, how much is left?2 weeks worth  Is this a 90 day supply: yes  PHARMACY: [facility], [address] Phone:  [phone]  Fax:  [phone]      Let patient know to contact pharmacy at the end of the day to make sure medication is ready.  Please notify patient to allow 48-72 hours to process

## 2021-09-29 NOTE — Telephone Encounter (Signed)
Patient said her symptoms are returning, she said there is a little bit of blood in her urine again, she wasn't sure what to do

## 2021-09-30 ENCOUNTER — Ambulatory Visit (INDEPENDENT_AMBULATORY_CARE_PROVIDER_SITE_OTHER): Payer: Medicare Other | Admitting: Family Medicine

## 2021-09-30 VITALS — BP 120/60 | HR 82 | Temp 97.6°F | Wt 167.0 lb

## 2021-09-30 DIAGNOSIS — R61 Generalized hyperhidrosis: Secondary | ICD-10-CM | POA: Insufficient documentation

## 2021-09-30 DIAGNOSIS — N3001 Acute cystitis with hematuria: Secondary | ICD-10-CM | POA: Diagnosis not present

## 2021-09-30 DIAGNOSIS — N390 Urinary tract infection, site not specified: Secondary | ICD-10-CM

## 2021-09-30 DIAGNOSIS — E039 Hypothyroidism, unspecified: Secondary | ICD-10-CM | POA: Diagnosis not present

## 2021-09-30 DIAGNOSIS — E782 Mixed hyperlipidemia: Secondary | ICD-10-CM

## 2021-09-30 DIAGNOSIS — I10 Essential (primary) hypertension: Secondary | ICD-10-CM | POA: Diagnosis not present

## 2021-09-30 LAB — CBC WITH DIFFERENTIAL/PLATELET
Basophils Absolute: 0 10*3/uL (ref 0.0–0.1)
Basophils Relative: 0.6 % (ref 0.0–3.0)
Eosinophils Absolute: 0.2 10*3/uL (ref 0.0–0.7)
Eosinophils Relative: 2.7 % (ref 0.0–5.0)
HCT: 40.1 % (ref 36.0–46.0)
Hemoglobin: 13.6 g/dL (ref 12.0–15.0)
Lymphocytes Relative: 24.4 % (ref 12.0–46.0)
Lymphs Abs: 1.4 10*3/uL (ref 0.7–4.0)
MCHC: 33.9 g/dL (ref 30.0–36.0)
MCV: 90.1 fl (ref 78.0–100.0)
Monocytes Absolute: 0.3 10*3/uL (ref 0.1–1.0)
Monocytes Relative: 5.5 % (ref 3.0–12.0)
Neutro Abs: 3.8 10*3/uL (ref 1.4–7.7)
Neutrophils Relative %: 66.8 % (ref 43.0–77.0)
Platelets: 205 10*3/uL (ref 150.0–400.0)
RBC: 4.45 Mil/uL (ref 3.87–5.11)
RDW: 13.9 % (ref 11.5–15.5)
WBC: 5.7 10*3/uL (ref 4.0–10.5)

## 2021-09-30 LAB — COMPREHENSIVE METABOLIC PANEL
ALT: 35 U/L (ref 0–35)
AST: 27 U/L (ref 0–37)
Albumin: 4.5 g/dL (ref 3.5–5.2)
Alkaline Phosphatase: 84 U/L (ref 39–117)
BUN: 15 mg/dL (ref 6–23)
CO2: 23 mEq/L (ref 19–32)
Calcium: 9.9 mg/dL (ref 8.4–10.5)
Chloride: 102 mEq/L (ref 96–112)
Creatinine, Ser: 0.85 mg/dL (ref 0.40–1.20)
GFR: 69.9 mL/min (ref >60.00)
Glucose, Bld: 103 mg/dL — ABNORMAL HIGH (ref 70–99)
Potassium: 3.9 mEq/L (ref 3.5–5.1)
Sodium: 138 mEq/L (ref 135–145)
Total Bilirubin: 0.8 mg/dL (ref 0.2–1.2)
Total Protein: 6.7 g/dL (ref 6.0–8.3)

## 2021-09-30 LAB — LIPID PANEL
Cholesterol: 117 mg/dL (ref 0–200)
HDL: 51 mg/dL (ref >39.00)
LDL Cholesterol: 33 mg/dL (ref 0–99)
NonHDL: 66.32
Total CHOL/HDL Ratio: 2
Triglycerides: 168 mg/dL — ABNORMAL HIGH (ref 0.0–149.0)
VLDL: 33.6 mg/dL (ref 0.0–40.0)

## 2021-09-30 LAB — POC URINALSYSI DIPSTICK (AUTOMATED)
Bilirubin, UA: NEGATIVE
Blood, UA: POSITIVE
Glucose, UA: NEGATIVE
Ketones, UA: NEGATIVE
Leukocytes, UA: NEGATIVE
Nitrite, UA: NEGATIVE
Protein, UA: POSITIVE — AB
Spec Grav, UA: 1.015 (ref 1.010–1.025)
Urobilinogen, UA: 0.2 E.U./dL
pH, UA: 5 (ref 5.0–8.0)

## 2021-09-30 LAB — TSH: TSH: 1.55 u[IU]/mL (ref 0.35–5.50)

## 2021-09-30 MED ORDER — AMOXICILLIN-POT CLAVULANATE 875-125 MG PO TABS: 1.0000 | ORAL_TABLET | Freq: Two times a day (BID) | ORAL | 0 refills | Status: DC

## 2021-09-30 NOTE — Assessment & Plan Note (Signed)
BP controlled.  Continue Imdur 30 mg and losartan 100 mg.  Labs today.

## 2021-09-30 NOTE — Assessment & Plan Note (Signed)
Continue Crestor 40 mg, check labs today.

## 2021-09-30 NOTE — Progress Notes (Signed)
Subjective:     Veronica [name] is a 71 y.o. female presenting for Follow-up (UTI symptoms returned after completion of antibiotics. )     Urinary Tract Infection  This is a new problem. The current episode started in the past 7 days. The quality of the pain is described as burning. There has been no fever. Associated symptoms include chills, frequency, hematuria, nausea, urgency and vomiting (dry heaves). Pertinent negatives include no flank pain. Associated symptoms comments: Headache.   Nausea - worse in the morning   Review of Systems  Constitutional:  Positive for chills, diaphoresis (night sweats) and fatigue. Negative for fever.  HENT:  Negative for congestion, rhinorrhea and sinus pain.   Respiratory:  Negative for cough and shortness of breath.   Gastrointestinal:  Positive for nausea and vomiting (dry heaves). Negative for abdominal pain, constipation and diarrhea.  Genitourinary:  Positive for dysuria, frequency, hematuria and urgency. Negative for difficulty urinating, flank pain, vaginal discharge and vaginal pain.  Musculoskeletal:  Negative for arthralgias and myalgias.   09/19/2021: UTI - treated with augmentin. UCx  50,000-100,000 CFU/mL of Group B Streptococcus  Social History   Tobacco Use  Smoking Status Never  Smokeless Tobacco Never        Objective:    BP Readings from Last 3 Encounters:  09/30/21 120/60  09/19/21 122/80  06/17/21 110/70   Wt Readings from Last 3 Encounters:  09/30/21 167 lb (75.8 kg)  09/19/21 166 lb 4 oz (75.4 kg)  06/17/21 167 lb 8 oz (76 kg)    BP 120/60   Pulse 82   Temp 97.6 F (36.4 C) (Temporal)   Wt 167 lb (75.8 kg)   LMP 02/06/2010 Comment: bleeding 12-14-2018  SpO2 99%   BMI 28.67 kg/m    Physical Exam Constitutional:      General: She is not in acute distress.    Appearance: She is well-developed. She is not diaphoretic.  HENT:     Right Ear: External ear normal.     Left Ear: External ear normal.      Nose: Nose normal.  Eyes:     Conjunctiva/sclera: Conjunctivae normal.  Cardiovascular:     Rate and Rhythm: Normal rate and regular rhythm.     Heart sounds: No murmur heard. Pulmonary:     Effort: Pulmonary effort is normal. No respiratory distress.     Breath sounds: Normal breath sounds. No wheezing.  Musculoskeletal:     Cervical back: Neck supple.  Skin:    General: Skin is warm and dry.     Capillary Refill: Capillary refill takes less than 2 seconds.  Neurological:     Mental Status: She is alert. Mental status is at baseline.  Psychiatric:        Mood and Affect: Mood normal.        Behavior: Behavior normal.     UA: +blood, +protein      Assessment & Plan:   Problem List Items Addressed This Visit       Cardiovascular and Mediastinum   Essential hypertension (Chronic)    BP controlled.  Continue Imdur 30 mg and losartan 100 mg.  Labs today.      Relevant Orders   Comprehensive metabolic panel     Endocrine   Hypothyroidism    She is experiencing some night sweats, check thyroid.  Continue levo 88 mcg pending labs.      Relevant Orders   TSH     Genitourinary  Recurrent UTI    Last urine with group B strep.  We will recheck culture today, treat with Augmentin as symptoms did resolve on this.  Discussed it was less than 100,000 units which may not indicate an infection or just colonization.  Symptoms we will treat again.  Follow-up culture anticipate getting sensitivities if it is the same bacteria.  And if symptoms recur again will refer to urology for further evaluation and treatment.        Other   Hyperlipidemia (Chronic)    Continue Crestor 40 mg, check labs today.      Relevant Orders   Lipid panel   Night sweats - Primary    She notes 1 week of just generalized feeling unwell.  Fatigue.  Only treated for UTI, urine is the same today.  We will treat for UTI with Augmentin.  We will also check some labs including CBC and thyroid to look for  causes of night sweats.  She did recently lose her husband some of the fatigue could be grief.  She will update if her symptoms do not resolve and we do not find another cause.      Relevant Orders   TSH   CBC with Differential   Other Visit Diagnoses     Acute cystitis with hematuria       Relevant Medications   amoxicillin-clavulanate (AUGMENTIN) 875-125 MG tablet   Other Relevant Orders   POCT Urinalysis Dipstick (Automated) (Completed)   Urine Culture        Return if symptoms worsen or fail to improve.  Lesleigh Noe, MD

## 2021-09-30 NOTE — Patient Instructions (Addendum)
[place] Drinking lots of water Antibiotics

## 2021-09-30 NOTE — Assessment & Plan Note (Signed)
Last urine with group B strep.  We will recheck culture today, treat with Augmentin as symptoms did resolve on this.  Discussed it was less than 100,000 units which may not indicate an infection or just colonization.  Symptoms we will treat again.  Follow-up culture anticipate getting sensitivities if it is the same bacteria.  And if symptoms recur again will refer to urology for further evaluation and treatment.

## 2021-09-30 NOTE — Assessment & Plan Note (Addendum)
She is experiencing some night sweats, check thyroid.  Continue levo 88 mcg pending labs.

## 2021-09-30 NOTE — Assessment & Plan Note (Signed)
She notes 1 week of just generalized feeling unwell.  Fatigue.  Only treated for UTI, urine is the same today.  We will treat for UTI with Augmentin.  We will also check some labs including CBC and thyroid to look for causes of night sweats.  She did recently lose her husband some of the fatigue could be grief.  She will update if her symptoms do not resolve and we do not find another cause.

## 2021-10-02 LAB — URINE CULTURE
MICRO NUMBER:: 13832947
SPECIMEN QUALITY:: ADEQUATE

## 2021-10-07 ENCOUNTER — Telehealth: Payer: Self-pay | Admitting: Family Medicine

## 2021-10-07 NOTE — Telephone Encounter (Signed)
Left message for patient to call back and schedule Medicare Annual Wellness Visit (AWV).   Please offer to do virtually or by telephone.   Last AWV:  04/07/18   Please schedule at anytime with [facility] Advisor schedule   45 minute appointent  If any questions, please contact me at [phone]

## 2021-10-11 ENCOUNTER — Telehealth: Payer: Self-pay | Admitting: Family Medicine

## 2021-10-11 NOTE — Telephone Encounter (Signed)
Patient declined the Medicare Wellness Visit with [facility]   Do not call back to schedule.  She state she does not want to schedule due to seeing her doctor regularly

## 2021-11-21 IMAGING — DX DG CHEST 2V
2 series · 2 of 2 positions shown · non-contrast
Comparison: July 03, 2013

CLINICAL DATA: Chest pain and shortness of breath

EXAM:
CHEST - 2 VIEW

[chest pa]
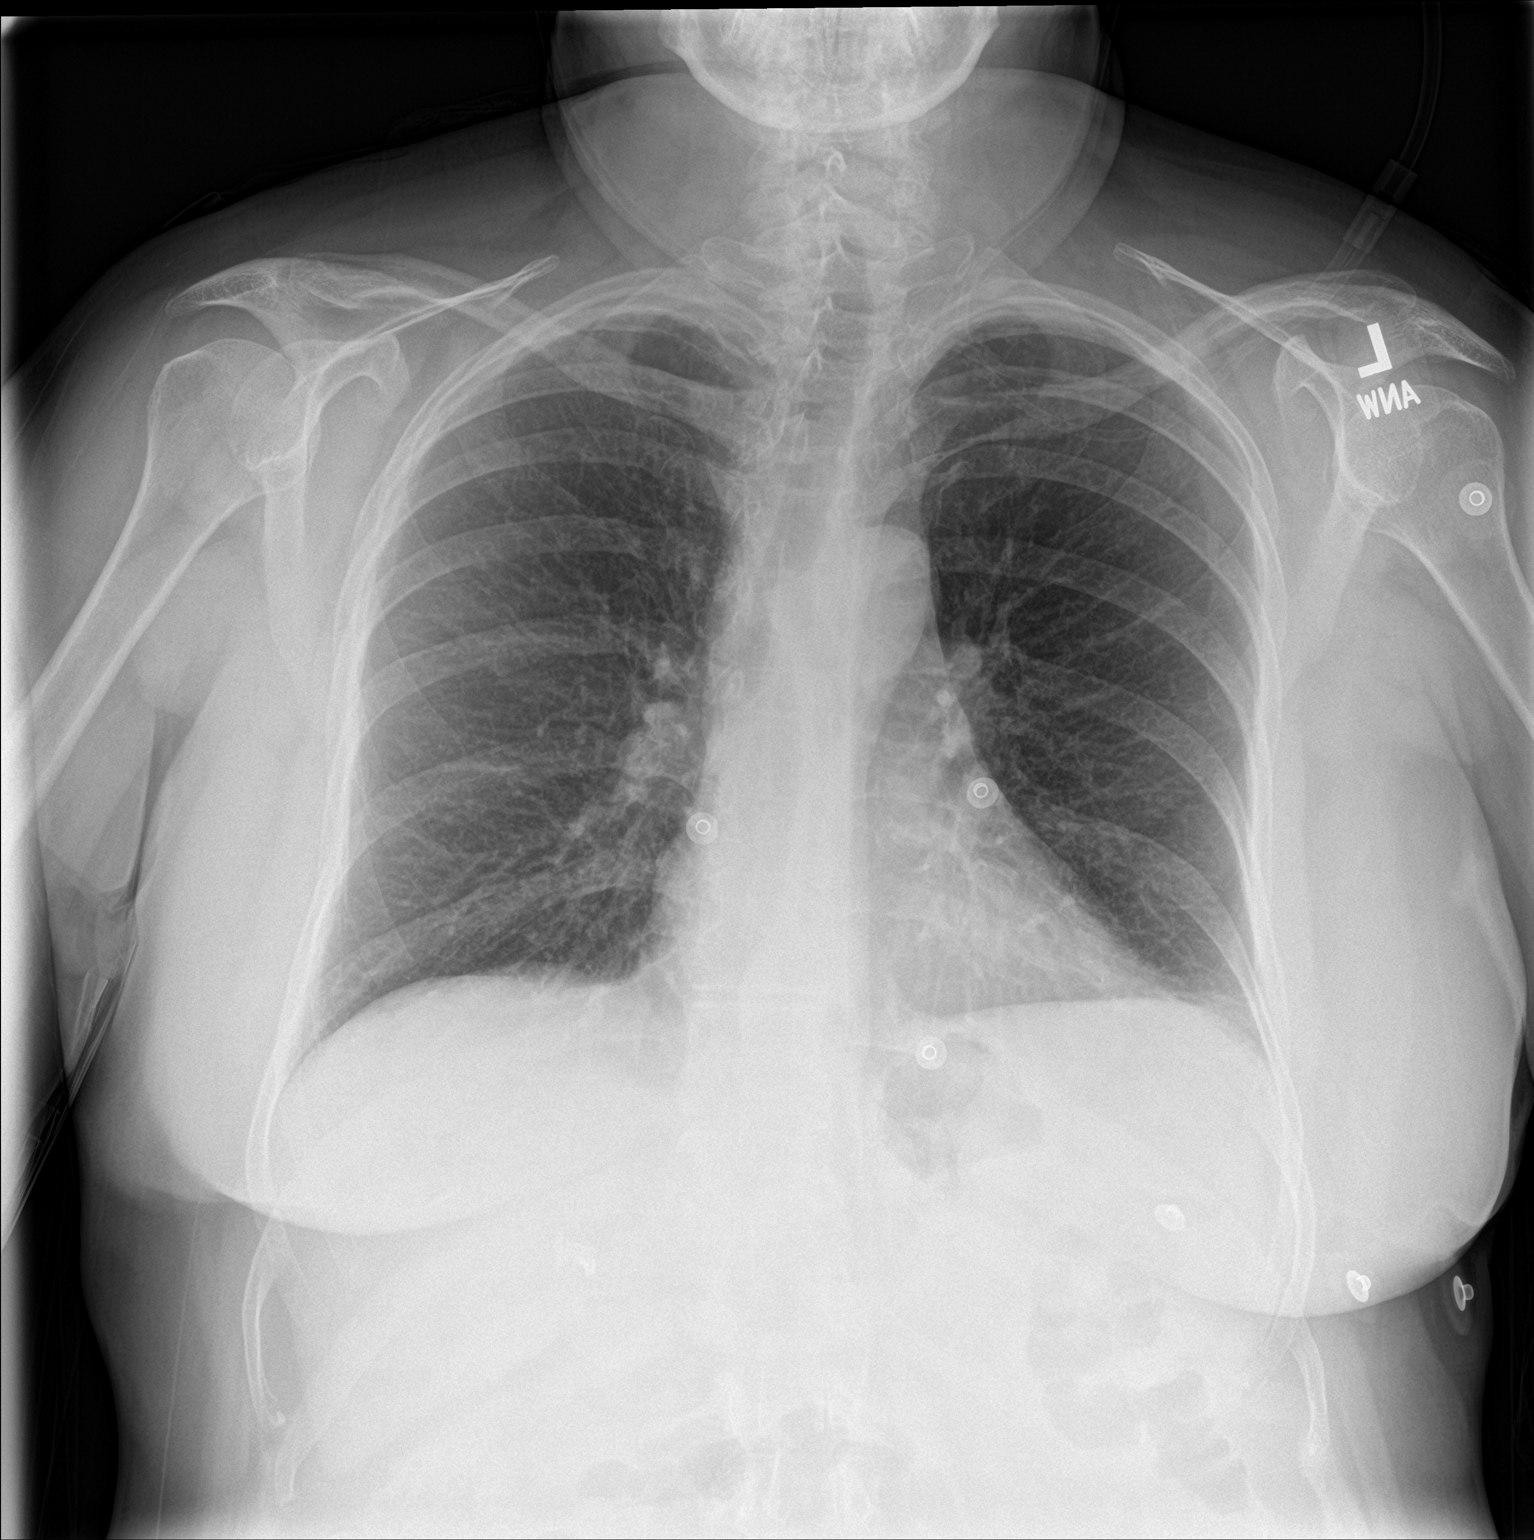

[chest lat]
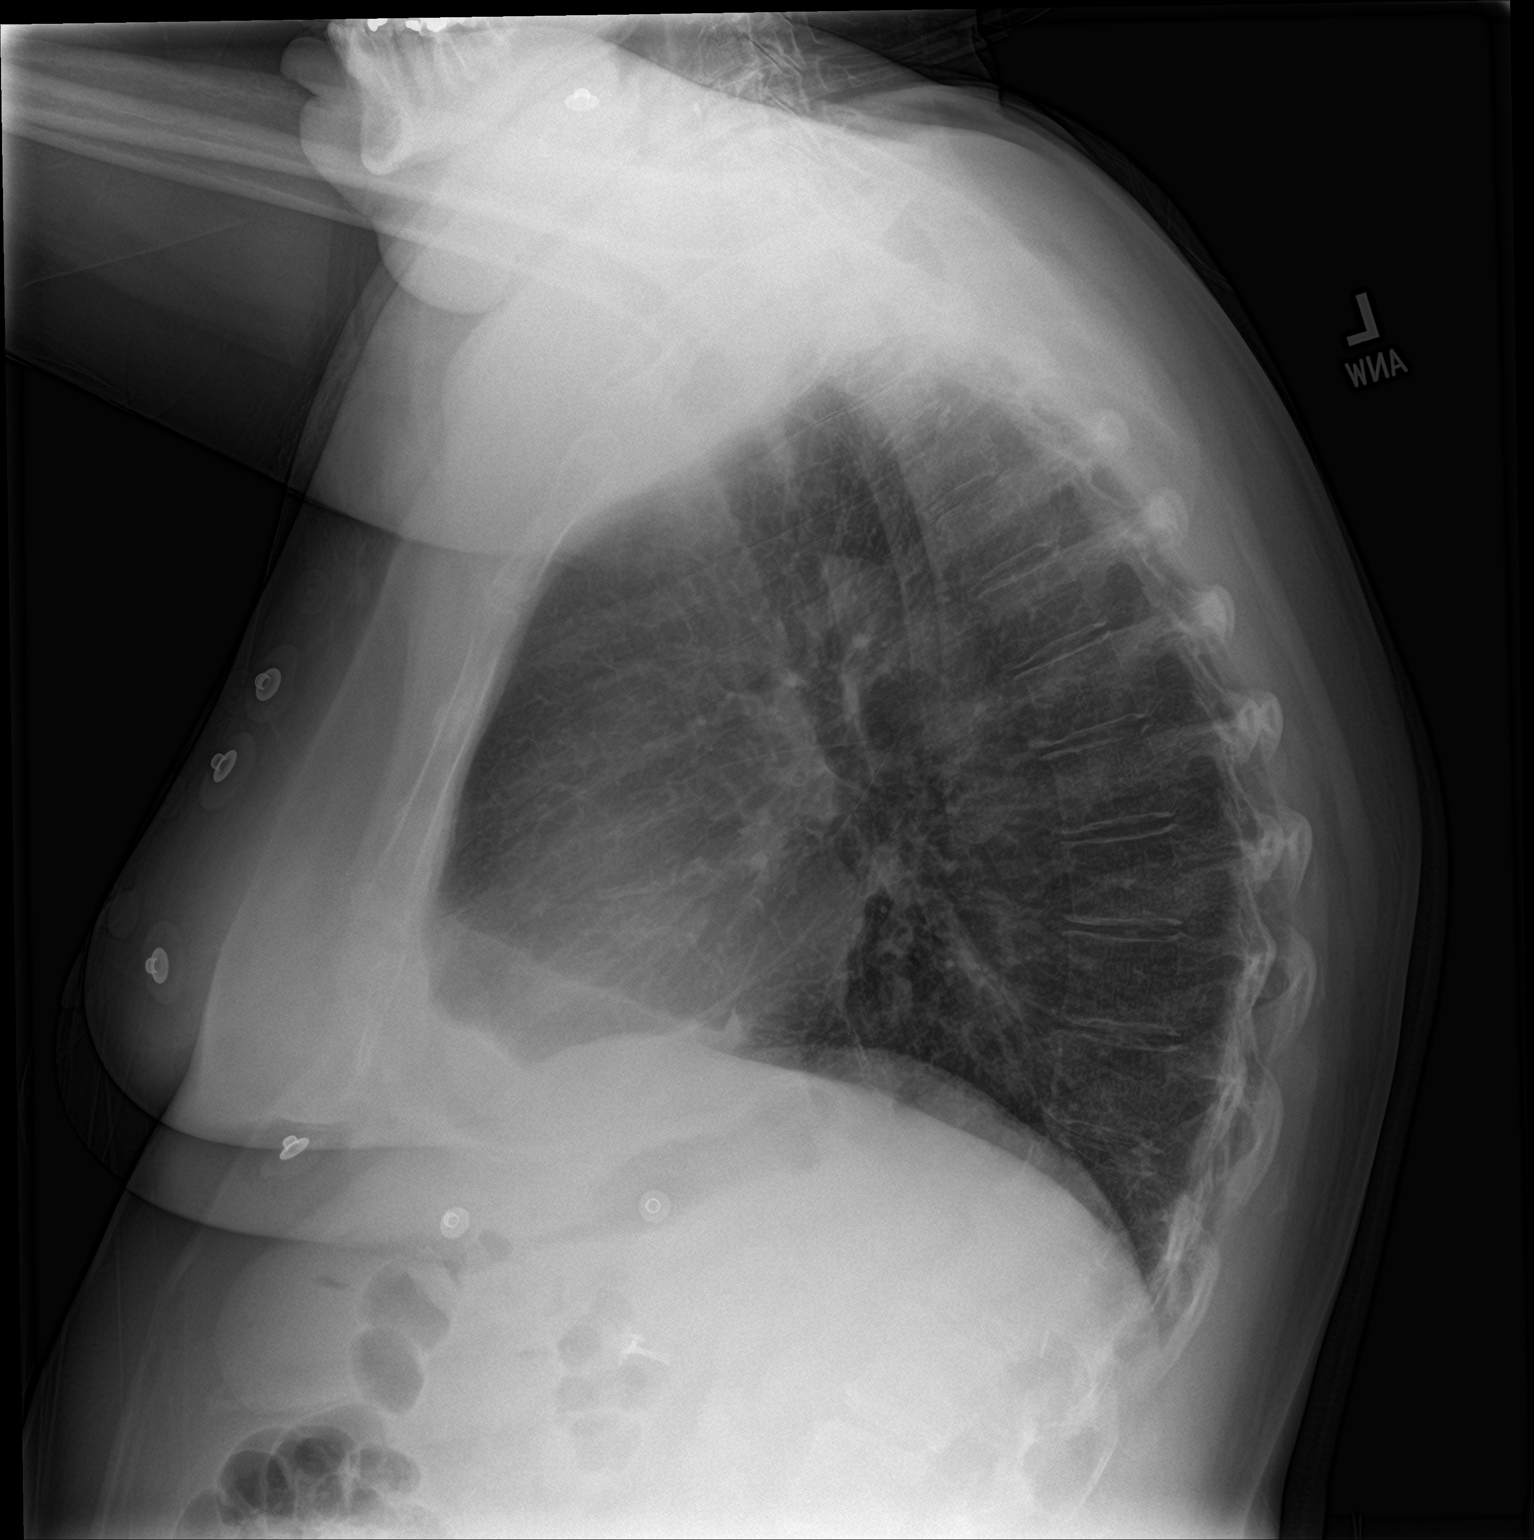

[2 of 2 positions shown; findings below may reference images not displayed]

FINDINGS: There is mild atelectatic change in the left base. The lungs
elsewhere are clear. The heart size and pulmonary vascularity are
normal. No adenopathy. No pneumothorax. No bone lesions.
IMPRESSION: Mild left base atelectasis. Lungs elsewhere clear. Heart size
normal. No adenopathy.

## 2021-11-28 ENCOUNTER — Other Ambulatory Visit: Payer: Self-pay

## 2021-11-28 MED ORDER — METOPROLOL TARTRATE 25 MG PO TABS: 12.5000 mg | ORAL_TABLET | Freq: Every day | ORAL | 0 refills | Status: DC

## 2021-11-30 ENCOUNTER — Other Ambulatory Visit: Payer: Self-pay

## 2021-11-30 MED ORDER — ROSUVASTATIN CALCIUM 40 MG PO TABS: 40.0000 mg | ORAL_TABLET | Freq: Every day | ORAL | 0 refills | Status: DC

## 2021-11-30 MED ORDER — LEVOTHYROXINE SODIUM 88 MCG PO TABS: ORAL_TABLET | ORAL | 0 refills | Status: DC

## 2021-11-30 MED ORDER — EZETIMIBE 10 MG PO TABS: 10.0000 mg | ORAL_TABLET | Freq: Every day | ORAL | 0 refills | Status: DC

## 2021-12-09 ENCOUNTER — Encounter: Payer: Self-pay | Admitting: Family

## 2021-12-09 ENCOUNTER — Ambulatory Visit (INDEPENDENT_AMBULATORY_CARE_PROVIDER_SITE_OTHER): Payer: Medicare Other | Admitting: Family

## 2021-12-09 VITALS — BP 118/64 | HR 76 | Temp 98.2°F | Resp 16 | Ht 64.0 in | Wt 164.1 lb

## 2021-12-09 DIAGNOSIS — E782 Mixed hyperlipidemia: Secondary | ICD-10-CM

## 2021-12-09 DIAGNOSIS — R311 Benign essential microscopic hematuria: Secondary | ICD-10-CM

## 2021-12-09 DIAGNOSIS — R053 Chronic cough: Secondary | ICD-10-CM

## 2021-12-09 DIAGNOSIS — E1169 Type 2 diabetes mellitus with other specified complication: Secondary | ICD-10-CM

## 2021-12-09 DIAGNOSIS — M8580 Other specified disorders of bone density and structure, unspecified site: Secondary | ICD-10-CM

## 2021-12-09 DIAGNOSIS — N952 Postmenopausal atrophic vaginitis: Secondary | ICD-10-CM

## 2021-12-09 DIAGNOSIS — E039 Hypothyroidism, unspecified: Secondary | ICD-10-CM

## 2021-12-09 DIAGNOSIS — U099 Post covid-19 condition, unspecified: Secondary | ICD-10-CM

## 2021-12-09 LAB — POCT GLYCOSYLATED HEMOGLOBIN (HGB A1C): Hemoglobin A1C: 6.3 % — AB (ref 4.0–5.6)

## 2021-12-09 NOTE — Assessment & Plan Note (Signed)
Estorgen topical prn

## 2021-12-09 NOTE — Assessment & Plan Note (Signed)
Continue famotidine 

## 2021-12-09 NOTE — Assessment & Plan Note (Signed)
Continue crestor 40 mg once daily Work on low cholesterol diet and exercise as tolerated

## 2021-12-09 NOTE — Assessment & Plan Note (Signed)
Recommend daily vitamin D and calcium  Work on weight bearing exercises  bone dexa for every two years.

## 2021-12-09 NOTE — Assessment & Plan Note (Signed)
Continue levothyroxine 88 mcg once daily.

## 2021-12-09 NOTE — Assessment & Plan Note (Signed)
Ordered hga1c today pending results. Work on diabetic diet and exercise as tolerated. Yearly foot exam, and annual eye exam.  Continue metformin 500 mg

## 2021-12-09 NOTE — Progress Notes (Signed)
Established Patient Office Visit  Subjective:  Patient ID: KINZE LABO, female    DOB: August 18, 1950  Age: 71 y.o. MRN: [id number]  CC:  Chief Complaint  Patient presents with   Transitions Of Care    HPI Veronica [name] is here for a transition of care visit.  Prior provider was: Dr. Waunita Schooner, MD Pt is without acute concerns.   Husband recently passed away in Oct 04, 2022, [place].   chronic concerns:  Recent h/o bil cataract repair, 9/23 currently still with prednisone drops.   HTN, CAD, and unstable angina: followed by cardiologist.   Hypothyroid: 88 mcg levothyroxine.  Lab Results  Component Value Date   TSH 1.55 09/30/2021   Osteopenia: not currently taking vitamin d or calcium.   Post covid chronic cough: improved with famotidine.    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   Anxiety    "menopausal related", history of   CAD S/P mLAD Bifurcation PCI: Resolute DES 2.25 mm x 18 mm(2.75 mm), 2.0 mm Angiosculpt of Diag  01/08/2014   [place] 0,1,1 midLAD-Diag lesion - progressed from Moderate to Severe 90% LAD, 70-80% Diag from 6-12 2015 --> Class IV Angina --> DES PCI with Angiosculpt PCI of Diag  FFR of m-dRCA 63-84% = 0.9    Complication of anesthesia    extreme sore throat and hoarseness after aneathesia, also "woke up screamimg" after surgery once"   Coronary artery disease involving native coronary artery with angina pectoris with documented spasm ([place]) 06/2013   h/o moderate 3 Vessel CAD with mLAD-Diag bifurcation ([place] 0,1,1)   Diabetes mellitus without complication ([place])    Essential hypertension    stress test 10 yrs ago.   pcp   dr Lennette Bihari little   GERD (gastroesophageal reflux disease)    Headache    Migraines childhood   History of migraine headaches    "as a child"   Hyperlipemia    Hyperthyroidism    "had radioactive iodine tx in the 1980's"   Hypothyroidism    Osteoarthritis     "joints ache"   Osteopenia     Past Surgical History:  Procedure Laterality Date   BACK SURGERY     BREAST CYST ASPIRATION Left 04/1999   "done in dr's office"   CARDIAC CATHETERIZATION  06/2013   CARDIAC CATHETERIZATION  01/08/2014   Procedure: INTRAVASCULAR PRESSURE WIRE/FFR STUDY;  Surgeon: Sinclair Grooms, MD;  Location[facility] CATH LAB;  Service: Cardiovascular;;  distal RCA   CARDIAC CATHETERIZATION  01/08/2014   Procedure: CORONARY BALLOON ANGIOPLASTY;  Surgeon: Sinclair Grooms, MD;  Location: [facility] CATH LAB;  Service: Cardiovascular;;  diag   COLONOSCOPY     CORONARY ANGIOPLASTY WITH STENT PLACEMENT  01/08/2014   "1"   [place] N/A 03/24/2016   Procedure: DILATATION & CURETTAGE/HYSTEROSCOPY WITH MYOSURE;  Surgeon: Megan Salon, MD;  Location: [place];  Service: Gynecology;  Laterality: N/A;   DILATION AND CURETTAGE OF UTERUS     LAPAROSCOPIC CHOLECYSTECTOMY  05/1995   LEFT HEART CATH AND CORONARY ANGIOGRAPHY N/A 11/19/2019   Procedure: LEFT HEART CATH AND CORONARY ANGIOGRAPHY;  Surgeon: Martinique, Peter M, MD;  Location: [facility] CV LAB;  Service: Cardiovascular;  Laterality: N/A;   LEFT HEART CATHETERIZATION WITH CORONARY ANGIOGRAM N/A 07/04/2013   Procedure: LEFT HEART CATHETERIZATION WITH CORONARY ANGIOGRAM;  Surgeon: Sinclair Grooms, MD;  Location:  [facility] CATH LAB;  Service: Cardiovascular;  Laterality: N/A;   LEFT HEART CATHETERIZATION WITH CORONARY ANGIOGRAM N/A 01/08/2014   Procedure: LEFT HEART CATHETERIZATION WITH CORONARY ANGIOGRAM;  Surgeon: Sinclair Grooms, MD;  Location: [facility] CATH LAB;  Service: Cardiovascular;  Laterality: N/A;   MOUTH SURGERY     PERCUTANEOUS STENT INTERVENTION  01/08/2014   Procedure: PERCUTANEOUS STENT INTERVENTION;  Surgeon: Sinclair Grooms, MD;  Location: [facility] CATH LAB;  Service: Cardiovascular;;  MID LAD   POSTERIOR CERVICAL FUSION/FORAMINOTOMY  04/04/2011   Procedure: POSTERIOR CERVICAL  FUSION/FORAMINOTOMY LEVEL 1;  Surgeon: Charlie Pitter, MD;  Location: [facility];  Service: Neurosurgery;  Laterality: Left;  Left Cervical Six-Seven Laminectomy, Foraminotomy, Diskectomy    TONSILLECTOMY     WISDOM TOOTH EXTRACTION     WRIST FRACTURE SURGERY Right 8's    Family History  Problem Relation Age of Onset   Heart disease Mother    Hypertension Mother    Parkinson's disease Mother    Heart disease Father    Hypertension Father    Diabetes type II Father    Parkinson's disease Father    Hypertension Sister    Heart Problems Sister    Asthma Sister    Hypertension Sister    Hypertension Brother     Social History   Socioeconomic History   Marital status: Widowed    Spouse name: Barnabas Lister   Number of children: 2   Years of education: some college   Highest education level: Not on file  Occupational History   Not on file  Tobacco Use   Smoking status: Never   Smokeless tobacco: Never  Vaping Use   Vaping Use: Never used  Substance and Sexual Activity   Alcohol use: No   Drug use: No   Sexual activity: Yes    Partners: Male    Birth control/protection: Post-menopausal  Other Topics Concern   Not on file  Social History Narrative   06/17/21   From: the area   Living: by herself currently, husband passed away 10-18-21   Work: retired - Therapist, sports      Family: 2 sons - [place] and [place] - 7 grandchildren      Enjoys: work at [employer], spend time with family, go to ball games, watch the grandchildren, reading, crafts      Exercise: walking based on weather, stationary bike 30 minutes daily   Diet: water diet - no fats/salt/carbs      Safety   Seat belts: Yes    Guns: No   Safe in relationships: Yes       Social Determinants of Radio broadcast assistant Strain: Not on file  Food Insecurity: Not on file  Transportation Needs: Not on file  Physical Activity: Not on file  Stress: Not on file  Social Connections: Not on file  Intimate Partner Violence: Not  on file    Outpatient Medications Prior to Visit  Medication Sig Dispense Refill   aspirin EC 81 MG EC tablet Take 1 tablet (81 mg total) by mouth daily. Swallow whole. 90 tablet 3   augmented betamethasone dipropionate (DIPROLENE-AF) 0.05 % cream Apply 1 application topically daily as needed (for skin irritation).      cyclobenzaprine (FLEXERIL) 10 MG tablet Take 10 mg by mouth 3 (three) times daily as needed for muscle spasms.   0   diphenhydrAMINE (BENADRYL) 25 mg capsule Take 25 mg by mouth every 6 (six) hours as needed for sleep.  estradiol (ESTRACE) 0.1 MG/GM vaginal cream Place 1 Applicatorful vaginally daily as needed. 42.5 g 5   ezetimibe (ZETIA) 10 MG tablet Take 1 tablet (10 mg total) by mouth daily. 90 tablet 0   famotidine (PEPCID) 20 MG tablet Take 20 mg by mouth 2 (two) times daily as needed for heartburn or indigestion.     fish oil-omega-3 fatty acids 1000 MG capsule Take 1 g by mouth daily.     halobetasol (ULTRAVATE) 0.05 % cream Apply 1 application topically daily as needed (for skin irritation).      hydrocortisone (ANUSOL-HC) 2.5 % rectal cream Place 1 application rectally 2 (two) times daily. As needed 30 g 5   hydrocortisone valerate cream (WESTCORT) 0.2 % Apply 1 application topically 2 (two) times daily as needed (rash).      isosorbide mononitrate (IMDUR) 30 MG 24 hr tablet TAKE 1 TABLET BY MOUTH EVERY DAY 90 tablet 3   levothyroxine (SYNTHROID) 88 MCG tablet TAKE 1 TABLET BY MOUTH EVERY DAY IN THE MORNING ON EMPTY STOMACH FOR 90 DAYS 90 tablet 0   losartan (COZAAR) 100 MG tablet Take 1 tablet (100 mg total) by mouth daily. 90 tablet 1   metFORMIN (GLUCOPHAGE) 500 MG tablet Take 1 tablet by mouth 2 (two) times daily.     metoprolol tartrate (LOPRESSOR) 25 MG tablet Take 0.5 tablets (12.5 mg total) by mouth daily. 45 tablet 0   Multiple Vitamins-Minerals (PRESERVISION AREDS PO) Take 1 capsule by mouth daily.     nitroGLYCERIN (NITROSTAT) 0.4 MG SL tablet PLACE 1  TABLET (0.4 MG TOTAL) UNDER THE TONGUE EVERY 5 (FIVE) MINUTES AS NEEDED FOR CHEST PAIN. 25 tablet 3   NONFORMULARY OR COMPOUNDED ITEM [facility]:  Antifungal topical - Terbinafine 3%, Fluconazole 2%, Tea Tree Oil 5%, Urea 10%, Ibuprofen 2%, in #49m DMSO suspension. Apply to affected toenail(s) once daily (at bedtime) or twice daily 30 each 11   ONETOUCH VERIO test strip 1 each by Other route every other day.      prednisoLONE acetate (PRED FORTE) 1 % ophthalmic suspension 1 drop 2 (two) times daily.     rosuvastatin (CRESTOR) 40 MG tablet Take 1 tablet (40 mg total) by mouth daily. 90 tablet 0   ondansetron (ZOFRAN) 4 MG tablet Take 4 mg by mouth every 8 (eight) hours as needed.     amoxicillin-clavulanate (AUGMENTIN) 875-125 MG tablet Take 1 tablet by mouth 2 (two) times daily. (Patient not taking: Reported on 12/09/2021) 20 tablet 0   No facility-administered medications prior to visit.    Allergies  Allergen Reactions   Ace Inhibitors Cough   Prilosec [Omeprazole Magnesium] Other (See Comments)    Blisters and skin peels off SGerilyn Nestlejohnsons    Sulfa Antibiotics Other (See Comments)    Blisters and skin peels off SGerilyn Nestlejohnsons   Ultram [Tramadol Hcl]     Lapse of memory      ROS Review of Systems  Review of Systems  Respiratory:  Negative for shortness of breath.   Cardiovascular:  Negative for chest pain and palpitations.  Gastrointestinal:  Negative for constipation and diarrhea.  Genitourinary:  Negative for dysuria, frequency and urgency.  Musculoskeletal:  Negative for myalgias.  Psychiatric/Behavioral:  Negative for depression and suicidal ideas.   All other systems reviewed and are negative.    Objective:    Physical Exam  Gen: NAD, resting comfortably CV: RRR with no murmurs appreciated Pulm: NWOB, CTAB with no crackles, wheezes, or rhonchi Skin: warm, dry  Psych: Normal affect and thought content  BP 118/64   Pulse 76   Temp 98.2 F (36.8 C)    Resp 16   Ht '5\' 4"'$  (1.626 m)   Wt 164 lb 2 oz (74.4 kg)   LMP 02/06/2010 Comment: bleeding 12-14-2018  SpO2 97%   BMI 28.17 kg/m  Wt Readings from Last 3 Encounters:  12/09/21 164 lb 2 oz (74.4 kg)  09/30/21 167 lb (75.8 kg)  09/19/21 166 lb 4 oz (75.4 kg)     Health Maintenance Due  Topic Date Due   Medicare Annual Wellness (AWV)  Never done   Diabetic kidney evaluation - Urine ACR  Never done   COVID-19 Vaccine (4 - Moderna series) 10/31/2020    There are no preventive care reminders to display for this patient.  Lab Results  Component Value Date   TSH 1.55 09/30/2021   Lab Results  Component Value Date   WBC 5.7 09/30/2021   HGB 13.6 09/30/2021   HCT 40.1 09/30/2021   MCV 90.1 09/30/2021   PLT 205.0 09/30/2021   Lab Results  Component Value Date   NA 138 09/30/2021   K 3.9 09/30/2021   CO2 23 09/30/2021   GLUCOSE 103 (H) 09/30/2021   BUN 15 09/30/2021   CREATININE 0.85 09/30/2021   BILITOT 0.8 09/30/2021   ALKPHOS 84 09/30/2021   AST 27 09/30/2021   ALT 35 09/30/2021   PROT 6.7 09/30/2021   ALBUMIN 4.5 09/30/2021   CALCIUM 9.9 09/30/2021   ANIONGAP 7 11/19/2019   GFR 69.90 09/30/2021   Lab Results  Component Value Date   CHOL 117 09/30/2021   Lab Results  Component Value Date   HDL 51.00 09/30/2021   Lab Results  Component Value Date   LDLCALC 33 09/30/2021   Lab Results  Component Value Date   TRIG 168.0 (H) 09/30/2021   Lab Results  Component Value Date   CHOLHDL 2 09/30/2021   Lab Results  Component Value Date   HGBA1C 6.3 (A) 12/09/2021      Assessment & Plan:   Problem List Items Addressed This Visit       Endocrine   Hypothyroidism    Continue levothyroxine 88 mcg once daily.       Type 2 diabetes mellitus with other specified complication ([place])    Ordered hga1c today pending results. Work on diabetic diet and exercise as tolerated. Yearly foot exam, and annual eye exam.  Continue metformin 500 mg       Relevant  Orders   POCT glycosylated hemoglobin (Hb A1C) (Completed)     Musculoskeletal and Integument   Osteopenia    Recommend daily vitamin D and calcium  Work on weight bearing exercises  bone dexa for every two years.          Genitourinary   Vaginal atrophy    Estorgen topical prn       Benign essential microscopic hematuria - Primary     Other   Hyperlipidemia (Chronic)    Continue crestor 40 mg once daily Work on low cholesterol diet and exercise as tolerated       Post-COVID chronic cough    Continue famotidine        No orders of the defined types were placed in this encounter.   Follow-up: Return in about 3 months (around 03/11/2022) for f/u diabetes.    Eugenia Pancoast, FNP

## 2021-12-14 ENCOUNTER — Other Ambulatory Visit: Payer: Self-pay | Admitting: Interventional Cardiology

## 2021-12-19 ENCOUNTER — Encounter: Payer: Self-pay | Admitting: Internal Medicine

## 2021-12-19 ENCOUNTER — Ambulatory Visit (INDEPENDENT_AMBULATORY_CARE_PROVIDER_SITE_OTHER): Payer: Medicare Other | Admitting: Internal Medicine

## 2021-12-19 VITALS — BP 122/70 | HR 95 | Temp 97.4°F | Ht 64.0 in | Wt 167.0 lb

## 2021-12-19 DIAGNOSIS — R21 Rash and other nonspecific skin eruption: Secondary | ICD-10-CM | POA: Insufficient documentation

## 2021-12-19 MED ORDER — DOXYCYCLINE HYCLATE 100 MG PO TABS
100.0000 mg | ORAL_TABLET | Freq: Two times a day (BID) | ORAL | 1 refills | Status: DC
Start: 2021-12-19 — End: 2022-01-06

## 2021-12-19 NOTE — Assessment & Plan Note (Signed)
Her description certainly makes me concerned about MRSA Looks better now Could be photosensitivity--but did use sun block and that wouldn't explain induration and pimples Will try 5 days of doxy 100 bid

## 2021-12-19 NOTE — Progress Notes (Signed)
Subjective:    Patient ID: Veronica [name], female    DOB: Aug 21, 1950, 71 y.o.   MRN: [id number]  HPI Here due to a red spot on her face  4 days ago--she saw dime sized spot on her right face Then it got hard and developed pustule It opened but didn't have much drainage 2 days ago--more redness and up towards here ear and chin Had pinpoint like pustules Yesterday noticed some pustules on left side of nose as well  Started some left over augmentin 3 nights ago The hard nodule is mostly gone---still some redness May still be spreading Current Outpatient Medications on File Prior to Visit  Medication Sig Dispense Refill   aspirin EC 81 MG EC tablet Take 1 tablet (81 mg total) by mouth daily. Swallow whole. 90 tablet 3   augmented betamethasone dipropionate (DIPROLENE-AF) 0.05 % cream Apply 1 application topically daily as needed (for skin irritation).      cyclobenzaprine (FLEXERIL) 10 MG tablet Take 10 mg by mouth 3 (three) times daily as needed for muscle spasms.   0   diphenhydrAMINE (BENADRYL) 25 mg capsule Take 25 mg by mouth every 6 (six) hours as needed for sleep.     estradiol (ESTRACE) 0.1 MG/GM vaginal cream Place 1 Applicatorful vaginally daily as needed. 42.5 g 5   ezetimibe (ZETIA) 10 MG tablet Take 1 tablet (10 mg total) by mouth daily. 90 tablet 0   famotidine (PEPCID) 20 MG tablet Take 20 mg by mouth 2 (two) times daily as needed for heartburn or indigestion.     fish oil-omega-3 fatty acids 1000 MG capsule Take 1 g by mouth daily.     halobetasol (ULTRAVATE) 0.05 % cream Apply 1 application topically daily as needed (for skin irritation).      hydrocortisone (ANUSOL-HC) 2.5 % rectal cream Place 1 application rectally 2 (two) times daily. As needed 30 g 5   hydrocortisone valerate cream (WESTCORT) 0.2 % Apply 1 application topically 2 (two) times daily as needed (rash).      isosorbide mononitrate (IMDUR) 30 MG 24 hr tablet TAKE 1 TABLET BY MOUTH EVERY DAY 90 tablet 0    levothyroxine (SYNTHROID) 88 MCG tablet TAKE 1 TABLET BY MOUTH EVERY DAY IN THE MORNING ON EMPTY STOMACH FOR 90 DAYS 90 tablet 0   losartan (COZAAR) 100 MG tablet Take 1 tablet (100 mg total) by mouth daily. 90 tablet 1   metFORMIN (GLUCOPHAGE) 500 MG tablet Take 1 tablet by mouth 2 (two) times daily.     metoprolol tartrate (LOPRESSOR) 25 MG tablet Take 0.5 tablets (12.5 mg total) by mouth daily. 45 tablet 0   Multiple Vitamins-Minerals (PRESERVISION AREDS PO) Take 1 capsule by mouth daily.     nitroGLYCERIN (NITROSTAT) 0.4 MG SL tablet PLACE 1 TABLET (0.4 MG TOTAL) UNDER THE TONGUE EVERY 5 (FIVE) MINUTES AS NEEDED FOR CHEST PAIN. 25 tablet 3   NONFORMULARY OR COMPOUNDED ITEM [facility]:  Antifungal topical - Terbinafine 3%, Fluconazole 2%, Tea Tree Oil 5%, Urea 10%, Ibuprofen 2%, in #18m DMSO suspension. Apply to affected toenail(s) once daily (at bedtime) or twice daily 30 each 11   ONETOUCH VERIO test strip 1 each by Other route every other day.      prednisoLONE acetate (PRED FORTE) 1 % ophthalmic suspension 1 drop 2 (two) times daily.     rosuvastatin (CRESTOR) 40 MG tablet Take 1 tablet (40 mg total) by mouth daily. 90 tablet 0   No current facility-administered medications  on file prior to visit.    Allergies  Allergen Reactions   Ace Inhibitors Cough   Prilosec [Omeprazole Magnesium] Other (See Comments)    Blisters and skin peels off Gerilyn Nestle johnsons    Sulfa Antibiotics Other (See Comments)    Blisters and skin peels off Gerilyn Nestle johnsons   Ultram [Tramadol Hcl]     Lapse of memory      Past Medical History:  Diagnosis Date   Angina, class IV ([place]) 01/2014   Anxiety    "menopausal related", history of   CAD S/P mLAD Bifurcation PCI: Resolute DES 2.25 mm x 18 mm(2.75 mm), 2.0 mm Angiosculpt of Diag  01/08/2014   [place] 0,1,1 midLAD-Diag lesion - progressed from Moderate to Severe 90% LAD, 70-80% Diag from 6-12 2015 --> Class IV Angina --> DES PCI with Angiosculpt  PCI of Diag  FFR of m-dRCA 67-20% = 0.9    Complication of anesthesia    extreme sore throat and hoarseness after aneathesia, also "woke up screamimg" after surgery once"   Coronary artery disease involving native coronary artery with angina pectoris with documented spasm ([place]) 06/2013   h/o moderate 3 Vessel CAD with mLAD-Diag bifurcation ([place] 0,1,1)   Diabetes mellitus without complication ([place])    Essential hypertension    stress test 10 yrs ago.   pcp   dr Lennette Bihari little   GERD (gastroesophageal reflux disease)    Headache    Migraines childhood   History of migraine headaches    "as a child"   Hyperlipemia    Hyperthyroidism    "had radioactive iodine tx in the 1980's"   Hypothyroidism    Osteoarthritis    "joints ache"   Osteopenia     Past Surgical History:  Procedure Laterality Date   BACK SURGERY     BREAST CYST ASPIRATION Left 04/1999   "done in dr's office"   CARDIAC CATHETERIZATION  06/2013   CARDIAC CATHETERIZATION  01/08/2014   Procedure: INTRAVASCULAR PRESSURE WIRE/FFR STUDY;  Surgeon: Sinclair Grooms, MD;  Location: [facility] CATH LAB;  Service: Cardiovascular;;  distal RCA   CARDIAC CATHETERIZATION  01/08/2014   Procedure: CORONARY BALLOON ANGIOPLASTY;  Surgeon: Sinclair Grooms, MD;  Location: [facility] CATH LAB;  Service: Cardiovascular;;  diag   COLONOSCOPY     CORONARY ANGIOPLASTY WITH STENT PLACEMENT  01/08/2014   "1"   [place] N/A 03/24/2016   Procedure: DILATATION & CURETTAGE/HYSTEROSCOPY WITH MYOSURE;  Surgeon: Megan Salon, MD;  Location: [place] [facility];  Service: Gynecology;  Laterality: N/A;   DILATION AND CURETTAGE OF UTERUS     LAPAROSCOPIC CHOLECYSTECTOMY  05/1995   LEFT HEART CATH AND CORONARY ANGIOGRAPHY N/A 11/19/2019   Procedure: LEFT HEART CATH AND CORONARY ANGIOGRAPHY;  Surgeon: Martinique, Peter M, MD;  Location: [place] CV LAB;  Service: Cardiovascular;  Laterality: N/A;   LEFT HEART CATHETERIZATION WITH CORONARY  ANGIOGRAM N/A 07/04/2013   Procedure: LEFT HEART CATHETERIZATION WITH CORONARY ANGIOGRAM;  Surgeon: Sinclair Grooms, MD;  Location: [facility] CATH LAB;  Service: Cardiovascular;  Laterality: N/A;   LEFT HEART CATHETERIZATION WITH CORONARY ANGIOGRAM N/A 01/08/2014   Procedure: LEFT HEART CATHETERIZATION WITH CORONARY ANGIOGRAM;  Surgeon: Sinclair Grooms, MD;  Location: [facility] CATH LAB;  Service: Cardiovascular;  Laterality: N/A;   MOUTH SURGERY     PERCUTANEOUS STENT INTERVENTION  01/08/2014   Procedure: PERCUTANEOUS STENT INTERVENTION;  Surgeon: Sinclair Grooms, MD;  Location: [facility] CATH LAB;  Service: Cardiovascular;;  MID LAD   POSTERIOR CERVICAL FUSION/FORAMINOTOMY  04/04/2011   Procedure: POSTERIOR CERVICAL FUSION/FORAMINOTOMY LEVEL 1;  Surgeon: Charlie Pitter, MD;  Location: [place] [facility];  Service: Neurosurgery;  Laterality: Left;  Left Cervical Six-Seven Laminectomy, Foraminotomy, Diskectomy    TONSILLECTOMY     WISDOM TOOTH EXTRACTION     WRIST FRACTURE SURGERY Right 107's    Family History  Problem Relation Age of Onset   Heart disease Mother    Hypertension Mother    Parkinson's disease Mother    Heart disease Father    Hypertension Father    Diabetes type II Father    Parkinson's disease Father    Hypertension Sister    Heart Problems Sister    Asthma Sister    Hypertension Sister    Hypertension Brother     Social History   Socioeconomic History   Marital status: Widowed    Spouse name: Barnabas Lister   Number of children: 2   Years of education: some college   Highest education level: Not on file  Occupational History   Not on file  Tobacco Use   Smoking status: Never   Smokeless tobacco: Never  Vaping Use   Vaping Use: Never used  Substance and Sexual Activity   Alcohol use: No   Drug use: No   Sexual activity: Yes    Partners: Male    Birth control/protection: Post-menopausal  Other Topics Concern   Not on file  Social History Narrative   06/17/21   From: the area    Living: by herself currently, husband passed away 2021-09-24   Work: retired - Therapist, sports      Family: 2 sons - [place] and [place] - 7 grandchildren      Enjoys: work at [employer], spend time with family, go to ball games, watch the grandchildren, reading, crafts      Exercise: walking based on weather, stationary bike 30 minutes daily   Diet: water diet - no fats/salt/carbs      Safety   Seat belts: Yes    Guns: No   Safe in relationships: Yes       Social Determinants of Radio broadcast assistant Strain: Not on file  Food Insecurity: Not on file  Transportation Needs: Not on file  Physical Activity: Not on file  Stress: Not on file  Social Connections: Not on file  Intimate Partner Violence: Not on file   Review of Systems No other lesions Chills and felt warm last 2 nights--no clear fever Was out on boat the day before symptoms--but no clear bite or injury    Objective:   Physical Exam Constitutional:      Appearance: Normal appearance.  Skin:    Comments: Some redness and slight warmth---right cheek > left cheek No ulcerations or blisters now  Neurological:     Mental Status: She is alert.            Assessment & Plan:

## 2021-12-20 ENCOUNTER — Ambulatory Visit: Payer: Medicare Other | Admitting: Family

## 2022-01-05 NOTE — Progress Notes (Signed)
Cardiology Office Note:    Date:  01/06/2022   ID:  Veronica [name], DOB [birth year], MRN [id number]  PCP:  Eugenia Pancoast, [place] [facility] Cardiologist:  Werner Lean, MD     Referring MD: Waunita Schooner, MD   CC: Transition to new cardiology  History of Present Illness:    Veronica [name] is a 71 y.o. female with a hx of CAD with prior PCI, HTN, HLD. And NAFLD.  Previously saw Dr. Tamala Julian.    Patient notes that she is doing as well as she can be.   Her [place] from [place] MI There are no interval hospital/ED visit.    Her anginal equivalent was sudden onset nausea, chest discomfort at rest while driving.  With dry heaving.    No chest pain or pressure .  No SOB/DOE and no PND/Orthopnea.  No weight gain or leg swelling.  No palpitations or syncope . Her exercise has been diminished.  She stopped going to the gym.  She is working through her grief. No nitro need  Past Medical History:  Diagnosis Date   Angina, class IV ([place]) 01/2014   Anxiety    "menopausal related", history of   CAD S/P mLAD Bifurcation PCI: Resolute DES 2.25 mm x 18 mm(2.75 mm), 2.0 mm Angiosculpt of Diag  01/08/2014   [place] 0,1,1 midLAD-Diag lesion - progressed from Moderate to Severe 90% LAD, 70-80% Diag from 6-12 2015 --> Class IV Angina --> DES PCI with Angiosculpt PCI of Diag  FFR of m-dRCA 67-12% = 0.9    Complication of anesthesia    extreme sore throat and hoarseness after aneathesia, also "woke up screamimg" after surgery once"   Coronary artery disease involving native coronary artery with angina pectoris with documented spasm ([place]) 06/2013   h/o moderate 3 Vessel CAD with mLAD-Diag bifurcation ([place] 0,1,1)   Diabetes mellitus without complication ([place])    Essential hypertension    stress test 10 yrs ago.   pcp   dr Lennette Bihari little   GERD (gastroesophageal reflux disease)    Headache    Migraines childhood   History of migraine headaches     "as a child"   Hyperlipemia    Hyperthyroidism    "had radioactive iodine tx in the 1980's"   Hypothyroidism    Osteoarthritis    "joints ache"   Osteopenia     Past Surgical History:  Procedure Laterality Date   BACK SURGERY     BREAST CYST ASPIRATION Left 04/1999   "done in dr's office"   CARDIAC CATHETERIZATION  06/2013   CARDIAC CATHETERIZATION  01/08/2014   Procedure: INTRAVASCULAR PRESSURE WIRE/FFR STUDY;  Surgeon: Sinclair Grooms, MD;  Location: [facility] CATH LAB;  Service: Cardiovascular;;  distal RCA   CARDIAC CATHETERIZATION  01/08/2014   Procedure: CORONARY BALLOON ANGIOPLASTY;  Surgeon: Sinclair Grooms, MD;  Location: [facility] CATH LAB;  Service: Cardiovascular;;  diag   COLONOSCOPY     CORONARY ANGIOPLASTY WITH STENT PLACEMENT  01/08/2014   "1"   [place] N/A 03/24/2016   Procedure: DILATATION & CURETTAGE/HYSTEROSCOPY WITH MYOSURE;  Surgeon: Megan Salon, MD;  Location: [facility];  Service: Gynecology;  Laterality: N/A;   DILATION AND CURETTAGE OF UTERUS     LAPAROSCOPIC CHOLECYSTECTOMY  05/1995   LEFT HEART CATH AND CORONARY ANGIOGRAPHY N/A 11/19/2019   Procedure: LEFT HEART CATH AND CORONARY ANGIOGRAPHY;  Surgeon: Martinique, Peter  M, MD;  Location: [place] CV LAB;  Service: Cardiovascular;  Laterality: N/A;   LEFT HEART CATHETERIZATION WITH CORONARY ANGIOGRAM N/A 07/04/2013   Procedure: LEFT HEART CATHETERIZATION WITH CORONARY ANGIOGRAM;  Surgeon: Sinclair Grooms, MD;  Location[facility] CATH LAB;  Service: Cardiovascular;  Laterality: N/A;   LEFT HEART CATHETERIZATION WITH CORONARY ANGIOGRAM N/A 01/08/2014   Procedure: LEFT HEART CATHETERIZATION WITH CORONARY ANGIOGRAM;  Surgeon: Sinclair Grooms, MD;  Location: [facility] CATH LAB;  Service: Cardiovascular;  Laterality: N/A;   MOUTH SURGERY     PERCUTANEOUS STENT INTERVENTION  01/08/2014   Procedure: PERCUTANEOUS STENT INTERVENTION;  Surgeon: Sinclair Grooms, MD;  Location: [facility] CATH LAB;   Service: Cardiovascular;;  MID LAD   POSTERIOR CERVICAL FUSION/FORAMINOTOMY  04/04/2011   Procedure: POSTERIOR CERVICAL FUSION/FORAMINOTOMY LEVEL 1;  Surgeon: Charlie Pitter, MD;  Location: [place] [facility];  Service: Neurosurgery;  Laterality: Left;  Left Cervical Six-Seven Laminectomy, Foraminotomy, Diskectomy    TONSILLECTOMY     WISDOM TOOTH EXTRACTION     WRIST FRACTURE SURGERY Right 1990's    Current Medications: Current Meds  Medication Sig   aspirin EC 81 MG EC tablet Take 1 tablet (81 mg total) by mouth daily. Swallow whole.   augmented betamethasone dipropionate (DIPROLENE-AF) 0.05 % cream Apply 1 application topically daily as needed (for skin irritation).    cyclobenzaprine (FLEXERIL) 10 MG tablet Take 10 mg by mouth 3 (three) times daily as needed for muscle spasms.    diphenhydrAMINE (BENADRYL) 25 mg capsule Take 25 mg by mouth every 6 (six) hours as needed for sleep.   estradiol (ESTRACE) 0.1 MG/GM vaginal cream Place 1 Applicatorful vaginally daily as needed.   ezetimibe (ZETIA) 10 MG tablet Take 1 tablet (10 mg total) by mouth daily.   famotidine (PEPCID) 20 MG tablet Take 20 mg by mouth 2 (two) times daily as needed for heartburn or indigestion.   fish oil-omega-3 fatty acids 1000 MG capsule Take 1 g by mouth daily.   halobetasol (ULTRAVATE) 0.05 % cream Apply 1 application topically daily as needed (for skin irritation).    hydrocortisone (ANUSOL-HC) 2.5 % rectal cream Place 1 application rectally 2 (two) times daily. As needed   hydrocortisone valerate cream (WESTCORT) 0.2 % Apply 1 application topically 2 (two) times daily as needed (rash).    isosorbide mononitrate (IMDUR) 30 MG 24 hr tablet TAKE 1 TABLET BY MOUTH EVERY DAY   levothyroxine (SYNTHROID) 88 MCG tablet TAKE 1 TABLET BY MOUTH EVERY DAY IN THE MORNING ON EMPTY STOMACH FOR 90 DAYS   losartan (COZAAR) 100 MG tablet Take 1 tablet (100 mg total) by mouth daily.   metFORMIN (GLUCOPHAGE) 500 MG tablet Take 1 tablet by  mouth 2 (two) times daily.   metoprolol tartrate (LOPRESSOR) 25 MG tablet Take 0.5 tablets (12.5 mg total) by mouth daily.   Multiple Vitamins-Minerals (PRESERVISION AREDS PO) Take 1 capsule by mouth daily.   nitroGLYCERIN (NITROSTAT) 0.4 MG SL tablet PLACE 1 TABLET (0.4 MG TOTAL) UNDER THE TONGUE EVERY 5 (FIVE) MINUTES AS NEEDED FOR CHEST PAIN.   NONFORMULARY OR COMPOUNDED ITEM [facility]:  Antifungal topical - Terbinafine 3%, Fluconazole 2%, Tea Tree Oil 5%, Urea 10%, Ibuprofen 2%, in #47m DMSO suspension. Apply to affected toenail(s) once daily (at bedtime) or twice daily   ONETOUCH VERIO test strip 1 each by Other route every other day.    rosuvastatin (CRESTOR) 40 MG tablet Take 1 tablet (40 mg total) by mouth daily.  Allergies:   Ace inhibitors, Prilosec [omeprazole magnesium], Sulfa antibiotics, Tramadol hcl, and Ultram [tramadol hcl]   Social History   Socioeconomic History   Marital status: Widowed    Spouse name: Barnabas Lister   Number of children: 2   Years of education: some college   Highest education level: Not on file  Occupational History   Not on file  Tobacco Use   Smoking status: Never   Smokeless tobacco: Never  Vaping Use   Vaping Use: Never used  Substance and Sexual Activity   Alcohol use: No   Drug use: No   Sexual activity: Yes    Partners: Male    Birth control/protection: Post-menopausal  Other Topics Concern   Not on file  Social History Narrative   06/17/21   From: the area   Living: by herself currently, husband passed away 10/01/21   Work: retired - Therapist, sports      Family: 2 sons - [place] and [place] - 7 grandchildren      Enjoys: work at [employer], spend time with family, go to ball games, watch the grandchildren, reading, crafts      Exercise: walking based on weather, stationary bike 30 minutes daily   Diet: water diet - no fats/salt/carbs      Safety   Seat belts: Yes    Guns: No   Safe in relationships: Yes       Social Determinants of  Radio broadcast assistant Strain: Not on file  Food Insecurity: Not on file  Transportation Needs: Not on file  Physical Activity: Not on file  Stress: Not on file  Social Connections: Not on file     Family History: The patient's family history includes Asthma in her sister; Diabetes type II in her father; Heart Problems in her sister; Heart disease in her father and mother; Hypertension in her brother, father, mother, sister, and sister; Parkinson's disease in her father and mother.  ROS:   Please see the history of present illness.     All other systems reviewed and are negative.  EKGs/Labs/Other Studies Reviewed:    The following studies were reviewed today:   EKG:  EKG is  ordered today.  The ekg ordered today demonstrates  01/06/22: SR   Cardiac Studies & Procedures   CARDIAC CATHETERIZATION  CARDIAC CATHETERIZATION 11/19/2019  Narrative  Prox LAD to Mid LAD lesion is 25% stenosed.  Previously placed Mid LAD stent (unknown type) is widely patent.  Ost Cx to Prox Cx lesion is 30% stenosed.  1st Mrg lesion is 25% stenosed.  Mid Cx to Dist Cx lesion is 25% stenosed.  LV end diastolic pressure is normal.  1. Nonobstructive CAD. Prior stent in the LAD is patent 2. Normal LVEDP.  Plan: consider other causes for her symptoms. Continue risk factor modification.  Findings Coronary Findings Diagnostic  Dominance: Right  Left Main Vessel was injected. Vessel is normal in caliber. Vessel is angiographically normal.  Left Anterior Descending Prox LAD to Mid LAD lesion is 25% stenosed. Previously placed Mid LAD stent (unknown type) is widely patent.  Left Circumflex Ost Cx to Prox Cx lesion is 30% stenosed. Mid Cx to Dist Cx lesion is 25% stenosed.  First Obtuse Marginal Branch 1st Mrg lesion is 25% stenosed.  Right Coronary Artery Vessel was injected. Vessel is normal in caliber. The vessel exhibits minimal luminal irregularities.  Intervention  No  interventions have been documented.   STRESS TESTS  MYOCARDIAL PERFUSION IMAGING 02/22/2017  Narrative  Nuclear stress EF: 65%.  Blood pressure demonstrated a normal response to exercise.  No T wave inversion was noted during stress.  There was no ST segment deviation noted during stress.  Defect 1: There is a medium defect of moderate severity.  This is a low risk study.  Medium size, moderate intensity fixed septal perfusion defect, likely artifact. No reversible ischemia. LVEF 65% with normal wall motion. This is a low risk study.   ECHOCARDIOGRAM  ECHOCARDIOGRAM COMPLETE 11/18/2019  Narrative ECHOCARDIOGRAM REPORT    Patient Name:   Veronica [name] Date of Exam: 11/18/2019 Medical Rec #:  [id number]     Height:       64.0 in Accession #:    [id number]    Weight:       170.0 lb Date of Birth:  1950-10-07     BSA:          1.826 m Patient Age:    64 years      BP:           145/67 mmHg Patient Gender: F             HR:           63 bpm. Exam Location:  Inpatient  Procedure: 2D Echo, Cardiac Doppler and Color Doppler  Indications:    Chest pain  History:        Patient has no prior history of Echocardiogram examinations. Angina and CAD, Signs/Symptoms:Chest Pain; Risk Factors:Hypertension, Dyslipidemia and Diabetes.  Sonographer:    Clayton Lefort RDCS (AE) Referring Phys: [id number] CADENCE H FURTH   Sonographer Comments: Suboptimal subcostal window. IMPRESSIONS   1. Left ventricular ejection fraction, by estimation, is 60 to 65%. The left ventricle has normal function. The left ventricle has no regional wall motion abnormalities. Left ventricular diastolic parameters are consistent with Grade I diastolic dysfunction (impaired relaxation). 2. Right ventricular systolic function is normal. The right ventricular size is normal. 3. Left atrial size was mildly dilated. 4. The mitral valve is normal in structure. No evidence of mitral valve regurgitation. No evidence of  mitral stenosis. 5. The aortic valve is normal in structure. Aortic valve regurgitation is not visualized. No aortic stenosis is present. 6. The inferior vena cava is normal in size with greater than 50% respiratory variability, suggesting right atrial pressure of 3 mmHg.  FINDINGS Left Ventricle: Left ventricular ejection fraction, by estimation, is 60 to 65%. The left ventricle has normal function. The left ventricle has no regional wall motion abnormalities. The left ventricular internal cavity size was normal in size. There is no left ventricular hypertrophy. Left ventricular diastolic parameters are consistent with Grade I diastolic dysfunction (impaired relaxation).  Right Ventricle: The right ventricular size is normal. No increase in right ventricular wall thickness. Right ventricular systolic function is normal.  Left Atrium: Left atrial size was mildly dilated.  Right Atrium: Right atrial size was normal in size.  Pericardium: There is no evidence of pericardial effusion.  Mitral Valve: The mitral valve is normal in structure. No evidence of mitral valve regurgitation. No evidence of mitral valve stenosis. MV peak gradient, 3.9 mmHg. The mean mitral valve gradient is 1.0 mmHg.  Tricuspid Valve: The tricuspid valve is normal in structure. Tricuspid valve regurgitation is not demonstrated. No evidence of tricuspid stenosis.  Aortic Valve: The aortic valve is normal in structure. Aortic valve regurgitation is not visualized. No aortic stenosis is present. Aortic valve mean gradient measures 3.0 mmHg. Aortic valve peak gradient measures  5.4 mmHg. Aortic valve area, by VTI measures 2.21 cm.  Pulmonic Valve: The pulmonic valve was normal in structure. Pulmonic valve regurgitation is not visualized. No evidence of pulmonic stenosis.  Aorta: The aortic root is normal in size and structure.  Venous: The inferior vena cava is normal in size with greater than 50% respiratory variability,  suggesting right atrial pressure of 3 mmHg.  IAS/Shunts: No atrial level shunt detected by color flow Doppler.   LEFT VENTRICLE PLAX 2D LVIDd:         3.60 cm  Diastology LVIDs:         2.30 cm  LV e' medial:    6.64 cm/s LV PW:         1.20 cm  LV E/e' medial:  11.0 LV IVS:        1.30 cm  LV e' lateral:   7.94 cm/s LVOT diam:     1.80 cm  LV E/e' lateral: 9.2 LV SV:         53 LV SV Index:   29 LVOT Area:     2.54 cm   RIGHT VENTRICLE RV Basal diam:  3.20 cm RV S prime:     10.40 cm/s TAPSE (M-mode): 2.4 cm  LEFT ATRIUM             Index       RIGHT ATRIUM           Index LA diam:        3.10 cm 1.70 cm/m  RA Area:     16.20 cm LA Vol (A2C):   32.3 ml 17.69 ml/m RA Volume:   40.40 ml  22.13 ml/m LA Vol (A4C):   53.8 ml 29.47 ml/m LA Biplane Vol: 41.8 ml 22.89 ml/m AORTIC VALVE AV Area (Vmax):    1.98 cm AV Area (Vmean):   1.99 cm AV Area (VTI):     2.21 cm AV Vmax:           116.00 cm/s AV Vmean:          81.300 cm/s AV VTI:            0.242 m AV Peak Grad:      5.4 mmHg AV Mean Grad:      3.0 mmHg LVOT Vmax:         90.10 cm/s LVOT Vmean:        63.700 cm/s LVOT VTI:          0.210 m LVOT/AV VTI ratio: 0.87  AORTA Ao Root diam: 3.10 cm Ao Asc diam:  2.80 cm  MITRAL VALVE MV Area (PHT): 3.53 cm    SHUNTS MV Peak grad:  3.9 mmHg    Systemic VTI:  0.21 m MV Mean grad:  1.0 mmHg    Systemic Diam: 1.80 cm MV Vmax:       0.99 m/s MV Vmean:      47.5 cm/s MV Decel Time: 215 msec MV E velocity: 72.80 cm/s MV A velocity: 91.30 cm/s MV E/A ratio:  0.80  Mihai Croitoru MD Electronically signed by Sanda Klein MD Signature Date/Time: 11/18/2019/7:34:04 PM    Final             Recent Labs: 09/30/2021: ALT 35; BUN 15; Creatinine, Ser 0.85; Hemoglobin 13.6; Platelets 205.0; Potassium 3.9; Sodium 138; TSH 1.55  Recent Lipid Panel    Component Value Date/Time   CHOL 117 09/30/2021 1151   TRIG 168.0 (H) 09/30/2021 1151   HDL  51.00 09/30/2021 1151    CHOLHDL 2 09/30/2021 1151   VLDL 33.6 09/30/2021 1151   LDLCALC 33 09/30/2021 1151        Physical Exam:    VS:  BP 130/80   Pulse 77   Ht '5\' 4"'$  (1.626 m)   Wt 167 lb (75.8 kg)   LMP 02/06/2010 Comment: bleeding 12-14-2018  SpO2 97%   BMI 28.67 kg/m     Wt Readings from Last 3 Encounters:  01/06/22 167 lb (75.8 kg)  12/19/21 167 lb (75.8 kg)  12/09/21 164 lb 2 oz (74.4 kg)   GEN:  Well nourished, well developed in no acute distress HEENT: Normal NECK: No JVD; No carotid bruits LYMPHATICS: No lymphadenopathy CARDIAC: RRR, no murmurs, rubs, gallops RESPIRATORY:  Clear to auscultation without rales, wheezing or rhonchi  ABDOMEN: Soft, non-tender, non-distended MUSCULOSKELETAL:  No edema; No deformity  SKIN: Warm and dry NEUROLOGIC:  Alert and oriented x 3 PSYCHIATRIC:  Normal affect   ASSESSMENT:    1. CAD S/P mLAD Bifurcation PCI: Resolute DES 2.25 mm x 18 mm(2.75 mm), 2.0 mm Angiosculpt of Diag    2. Essential hypertension   3. Type 2 diabetes mellitus with other specified complication, without long-term current use of insulin ([place])   4. Mixed hyperlipidemia    PLAN:    Coronary Artery Disease; Obstructive - asymptomatic on With HTN and HLD With DM  - anatomy: LAD PCI - continue ASA 81 mg - continue statin and zetia, goal LDL < 55 - continue BB at low dose; if fatigue can do trial off - continue nitrates; 30 mg - no PRN nitro needed; but had chest discomfort when her husband died - Has ACEi allergy; continue ARB - discussed cardiac rehab if worsening when she goes back to the gym (stable chronic angina indication)    Medication Adjustments/Labs and Tests Ordered: Current medicines are reviewed at length with the patient today.  Concerns regarding medicines are outlined above.  Orders Placed This Encounter  Procedures   EKG 12-Lead   No orders of the defined types were placed in this encounter.   Patient Instructions  Medication Instructions:  Your  physician recommends that you continue on your current medications as directed. Please refer to the Current Medication list given to you today.  *If you need a refill on your cardiac medications before your next appointment, please call your pharmacy*   Lab Work: NONE If you have labs (blood work) drawn today and your tests are completely normal, you will receive your results only by: [place] (if you have MyChart) OR A paper copy in the mail If you have any lab test that is abnormal or we need to change your treatment, we will call you to review the results.   Testing/Procedures: NONE   Follow-Up: At [facility], you and your health needs are our priority.  As part of our continuing mission to provide you with exceptional heart care, we have created designated Provider Care Teams.  These Care Teams include your primary Cardiologist (physician) and Advanced Practice Providers (APPs -  Physician Assistants and Nurse Practitioners) who all work together to provide you with the care you need, when you need it.   Your next appointment:   1 year(s)  The format for your next appointment:   In Person  Provider:   Werner Lean, MD     Important Information About Sugar         Signed, [place],  MD  01/06/2022 11:19 AM    [facility]

## 2022-01-06 ENCOUNTER — Encounter: Payer: Self-pay | Admitting: Internal Medicine

## 2022-01-06 ENCOUNTER — Ambulatory Visit: Payer: Medicare Other | Attending: Internal Medicine | Admitting: Internal Medicine

## 2022-01-06 VITALS — BP 130/80 | HR 77 | Ht 64.0 in | Wt 167.0 lb

## 2022-01-06 DIAGNOSIS — I2510 Atherosclerotic heart disease of native coronary artery without angina pectoris: Secondary | ICD-10-CM | POA: Diagnosis not present

## 2022-01-06 DIAGNOSIS — E1169 Type 2 diabetes mellitus with other specified complication: Secondary | ICD-10-CM

## 2022-01-06 DIAGNOSIS — E782 Mixed hyperlipidemia: Secondary | ICD-10-CM

## 2022-01-06 DIAGNOSIS — I10 Essential (primary) hypertension: Secondary | ICD-10-CM | POA: Diagnosis not present

## 2022-01-06 DIAGNOSIS — Z9861 Coronary angioplasty status: Secondary | ICD-10-CM

## 2022-01-06 NOTE — Patient Instructions (Signed)
Medication Instructions:  Your physician recommends that you continue on your current medications as directed. Please refer to the Current Medication list given to you today.  *If you need a refill on your cardiac medications before your next appointment, please call your pharmacy*   Lab Work: NONE If you have labs (blood work) drawn today and your tests are completely normal, you will receive your results only by: [place] (if you have MyChart) OR A paper copy in the mail If you have any lab test that is abnormal or we need to change your treatment, we will call you to review the results.   Testing/Procedures: NONE   Follow-Up: At [facility], you and your health needs are our priority.  As part of our continuing mission to provide you with exceptional heart care, we have created designated Provider Care Teams.  These Care Teams include your primary Cardiologist (physician) and Advanced Practice Providers (APPs -  Physician Assistants and Nurse Practitioners) who all work together to provide you with the care you need, when you need it.   Your next appointment:   1 year(s)  The format for your next appointment:   In Person  Provider:   Werner Lean, MD     Important Information About Sugar

## 2022-02-05 ENCOUNTER — Other Ambulatory Visit: Payer: Self-pay | Admitting: Obstetrics & Gynecology

## 2022-02-05 DIAGNOSIS — N905 Atrophy of vulva: Secondary | ICD-10-CM

## 2022-02-26 ENCOUNTER — Other Ambulatory Visit: Payer: Self-pay | Admitting: Family

## 2022-03-13 ENCOUNTER — Encounter: Payer: Self-pay | Admitting: Family

## 2022-03-13 ENCOUNTER — Ambulatory Visit (INDEPENDENT_AMBULATORY_CARE_PROVIDER_SITE_OTHER): Payer: Medicare Other | Admitting: Family

## 2022-03-13 VITALS — BP 118/76 | HR 76 | Temp 98.5°F | Ht 64.0 in | Wt 166.2 lb

## 2022-03-13 DIAGNOSIS — E663 Overweight: Secondary | ICD-10-CM

## 2022-03-13 DIAGNOSIS — E1169 Type 2 diabetes mellitus with other specified complication: Secondary | ICD-10-CM

## 2022-03-13 DIAGNOSIS — I10 Essential (primary) hypertension: Secondary | ICD-10-CM

## 2022-03-13 DIAGNOSIS — E782 Mixed hyperlipidemia: Secondary | ICD-10-CM

## 2022-03-13 DIAGNOSIS — E039 Hypothyroidism, unspecified: Secondary | ICD-10-CM

## 2022-03-13 DIAGNOSIS — Z6828 Body mass index (BMI) 28.0-28.9, adult: Secondary | ICD-10-CM

## 2022-03-13 LAB — MICROALBUMIN / CREATININE URINE RATIO
Creatinine,U: 138.5 mg/dL
Microalb Creat Ratio: 5.1 mg/g (ref 0.0–30.0)
Microalb, Ur: 7.1 mg/dL — ABNORMAL HIGH (ref 0.0–1.9)

## 2022-03-13 LAB — POCT GLYCOSYLATED HEMOGLOBIN (HGB A1C): Hemoglobin A1C: 6.2 % — AB (ref 4.0–5.6)

## 2022-03-13 MED ORDER — LEVOTHYROXINE SODIUM 88 MCG PO TABS: ORAL_TABLET | ORAL | 0 refills | Status: DC

## 2022-03-13 MED ORDER — ROSUVASTATIN CALCIUM 40 MG PO TABS: 40.0000 mg | ORAL_TABLET | Freq: Every day | ORAL | 0 refills | Status: DC

## 2022-03-13 MED ORDER — EZETIMIBE 10 MG PO TABS: 10.0000 mg | ORAL_TABLET | Freq: Every day | ORAL | 0 refills | Status: DC

## 2022-03-13 MED ORDER — LOSARTAN POTASSIUM 100 MG PO TABS: 100.0000 mg | ORAL_TABLET | Freq: Every day | ORAL | 1 refills | Status: DC

## 2022-03-13 MED ORDER — METOPROLOL TARTRATE 25 MG PO TABS: 12.5000 mg | ORAL_TABLET | Freq: Every day | ORAL | 0 refills | Status: DC

## 2022-03-13 NOTE — Assessment & Plan Note (Signed)
Continue levothyroxine 88 mcg once daily.  Reviewed tsh, stable. Will recheck next visit.

## 2022-03-13 NOTE — Assessment & Plan Note (Signed)
Stable doing well continue medications as prescribed. Urine microalbumin today.

## 2022-03-13 NOTE — Assessment & Plan Note (Signed)
Continue crestor 40 mg as well as zetia.  Work on low cholesterol diet and exercise as tolerated

## 2022-03-13 NOTE — Assessment & Plan Note (Signed)
Ordered hga1c today pending results. Work on diabetic diet and exercise as tolerated. Yearly foot exam, and annual eye exam. Continue metformin 500 mg once daily.    

## 2022-03-13 NOTE — Progress Notes (Signed)
Established Patient Office Visit  Subjective:      CC:  Chief Complaint  Patient presents with   Medical Management of Chronic Issues    HPI: Veronica [name] is a 72 y.o. female presenting on 03/13/2022 for Medical Management of Chronic Issues . CAD asymptomatic, stable. Now with annual f/u.   H/o facial rash, went and saw dermatology dx with rosacea, on doxycycline regularly since then with improvement.   Hypothyroid: 88 mcg once daily.   DM2: eye exam within the last one year. On metformin 500 mg once daily. A1c in office today 6.2.   HLD: tolerating Crestor 40 mg once daily well. Also taking zetia as well for cholesterol.  Lab Results  Component Value Date   CHOL 117 09/30/2021   HDL 51.00 09/30/2021   LDLCALC 33 09/30/2021   TRIG 168.0 (H) 09/30/2021   CHOLHDL 2 09/30/2021    Pt states back to exercising throughout the week [place] and working on stationary bike in the evenings 5 days out of the week.   Grieving: lost husband August 2023. Going to grief share once a month.    Social history:  Relevant past medical, surgical, family and social history reviewed and updated as indicated. Interim medical history since our last visit reviewed.  Allergies and medications reviewed and updated.  DATA REVIEWED: CHART IN EPIC     ROS: Negative unless specifically indicated above in HPI.    Current Outpatient Medications:    aspirin EC 81 MG EC tablet, Take 1 tablet (81 mg total) by mouth daily. Swallow whole., Disp: 90 tablet, Rfl: 3   augmented betamethasone dipropionate (DIPROLENE-AF) 0.05 % cream, Apply 1 application topically daily as needed (for skin irritation). , Disp: , Rfl:    cyclobenzaprine (FLEXERIL) 10 MG tablet, Take 10 mg by mouth 3 (three) times daily as needed for muscle spasms. , Disp: , Rfl: 0   diphenhydrAMINE (BENADRYL) 25 mg capsule, Take 25 mg by mouth every 6 (six) hours as needed for sleep., Disp: , Rfl:     doxycycline (VIBRA-TABS) 100 MG tablet, Take 100 mg by mouth 2 (two) times daily., Disp: , Rfl:    estradiol (ESTRACE) 0.1 MG/GM vaginal cream, PLACE 1 APPLICATORFUL VAGINALLY DAILY AS NEEDED., Disp: 42.5 g, Rfl: 5   famotidine (PEPCID) 20 MG tablet, Take 20 mg by mouth 2 (two) times daily as needed for heartburn or indigestion., Disp: , Rfl:    fish oil-omega-3 fatty acids 1000 MG capsule, Take 1 g by mouth daily., Disp: , Rfl:    halobetasol (ULTRAVATE) 0.05 % cream, Apply 1 application topically daily as needed (for skin irritation). , Disp: , Rfl:    hydrocortisone (ANUSOL-HC) 2.5 % rectal cream, Place 1 application rectally 2 (two) times daily. As needed, Disp: 30 g, Rfl: 5   hydrocortisone valerate cream (WESTCORT) 0.2 %, Apply 1 application topically 2 (two) times daily as needed (rash). , Disp: , Rfl:    isosorbide mononitrate (IMDUR) 30 MG 24 hr tablet, TAKE 1 TABLET BY MOUTH EVERY DAY, Disp: 90 tablet, Rfl: 0   metFORMIN (GLUCOPHAGE) 500 MG tablet, Take 1 tablet by mouth 2 (two) times daily., Disp: , Rfl:    metroNIDAZOLE (METROCREAM) 0.75 % cream, SMARTSIG:1 Sparingly Topical Twice Daily, Disp: , Rfl:    Multiple Vitamins-Minerals (PRESERVISION AREDS PO), Take 1 capsule by mouth daily., Disp: , Rfl:    nitroGLYCERIN (NITROSTAT) 0.4 MG SL tablet, PLACE 1 TABLET (0.4 MG  TOTAL) UNDER THE TONGUE EVERY 5 (FIVE) MINUTES AS NEEDED FOR CHEST PAIN., Disp: 25 tablet, Rfl: 3   NONFORMULARY OR COMPOUNDED ITEM,  Apothecary:  Antifungal topical - Terbinafine 3%, Fluconazole 2%, Tea Tree Oil 5%, Urea 10%, Ibuprofen 2%, in #81m DMSO suspension. Apply to affected toenail(s) once daily (at bedtime) or twice daily, Disp: 30 each, Rfl: 11   ONETOUCH VERIO test strip, 1 each by Other route every other day. , Disp: , Rfl:    ezetimibe (ZETIA) 10 MG tablet, Take 1 tablet (10 mg total) by mouth daily., Disp: 90 tablet, Rfl: 0   levothyroxine (SYNTHROID) 88 MCG tablet, TAKE 1 TABLET BY MOUTH EVERY DAY IN  THE MORNING ON EMPTY STOMACH FOR 90 DAYS, Disp: 90 tablet, Rfl: 0   losartan (COZAAR) 100 MG tablet, Take 1 tablet (100 mg total) by mouth daily., Disp: 90 tablet, Rfl: 1   metoprolol tartrate (LOPRESSOR) 25 MG tablet, Take 0.5 tablets (12.5 mg total) by mouth daily., Disp: 45 tablet, Rfl: 0   rosuvastatin (CRESTOR) 40 MG tablet, Take 1 tablet (40 mg total) by mouth daily., Disp: 90 tablet, Rfl: 0      Objective:    BP 118/76   Pulse 76   Temp 98.5 F (36.9 C) (Temporal)   Ht '5\' 4"'$  (1.626 m)   Wt 166 lb 3.2 oz (75.4 kg)   LMP 02/06/2010 Comment: bleeding 12-14-2018  SpO2 99%   BMI 28.53 kg/m   Wt Readings from Last 3 Encounters:  03/13/22 166 lb 3.2 oz (75.4 kg)  01/06/22 167 lb (75.8 kg)  12/19/21 167 lb (75.8 kg)    Physical Exam  Physical Exam Constitutional:      General: not in acute distress.    Appearance: Normal appearance. normal weight. is not ill-appearing, toxic-appearing or diaphoretic.  Cardiovascular:     Rate and Rhythm: Normal rate.  Pulmonary:     Effort: Pulmonary effort is normal.  Musculoskeletal:        General: Normal range of motion.  Neurological:     General: No focal deficit present.     Mental Status: alert and oriented to person, place, and time. Mental status is at baseline.  Psychiatric:        Mood and Affect: Mood normal.        Behavior: Behavior normal.        Thought Content: Thought content normal.        Judgment: Judgment normal.        Assessment & Plan:  Type 2 diabetes mellitus with other specified complication, without long-term current use of insulin (HCC) Assessment & Plan: Ordered hga1c today pending results. Work on diabetic diet and exercise as tolerated. Yearly foot exam, and annual eye exam.  Continue metformin 500 mg once daily.   Orders: -     POCT glycosylated hemoglobin (Hb A1C) -     Microalbumin / creatinine urine ratio  Essential hypertension Assessment & Plan: Stable doing well continue medications  as prescribed. Urine microalbumin today.    Hypothyroidism, unspecified type Assessment & Plan: Continue levothyroxine 88 mcg once daily.  Reviewed tsh, stable. Will recheck next visit.    Overweight with body mass index (BMI) of 28 to 28.9 in adult  Mixed hyperlipidemia Assessment & Plan: Continue crestor 40 mg as well as zetia.  Work on low cholesterol diet and exercise as tolerated    Other orders -     Rosuvastatin Calcium; Take 1 tablet (40 mg total) by mouth  daily.  Dispense: 90 tablet; Refill: 0 -     Ezetimibe; Take 1 tablet (10 mg total) by mouth daily.  Dispense: 90 tablet; Refill: 0 -     Metoprolol Tartrate; Take 0.5 tablets (12.5 mg total) by mouth daily.  Dispense: 45 tablet; Refill: 0 -     Levothyroxine Sodium; TAKE 1 TABLET BY MOUTH EVERY DAY IN THE MORNING ON EMPTY STOMACH FOR 90 DAYS  Dispense: 90 tablet; Refill: 0 -     Losartan Potassium; Take 1 tablet (100 mg total) by mouth daily.  Dispense: 90 tablet; Refill: 1     Return in about 6 months (around 09/11/2022) for f/u cholesterol.  Eugenia Pancoast, MSN, APRN, FNP-C [facility]

## 2022-03-21 ENCOUNTER — Other Ambulatory Visit: Payer: Self-pay

## 2022-03-21 MED ORDER — ISOSORBIDE MONONITRATE ER 30 MG PO TB24: 30.0000 mg | ORAL_TABLET | Freq: Every day | ORAL | 3 refills | Status: DC

## 2022-03-24 ENCOUNTER — Encounter: Payer: Self-pay | Admitting: Family

## 2022-03-24 ENCOUNTER — Ambulatory Visit (INDEPENDENT_AMBULATORY_CARE_PROVIDER_SITE_OTHER): Payer: Medicare Other | Admitting: Family

## 2022-03-24 VITALS — BP 130/78 | HR 96 | Temp 97.8°F | Ht 64.0 in | Wt 168.8 lb

## 2022-03-24 DIAGNOSIS — J029 Acute pharyngitis, unspecified: Secondary | ICD-10-CM | POA: Diagnosis not present

## 2022-03-24 DIAGNOSIS — J0140 Acute pansinusitis, unspecified: Secondary | ICD-10-CM | POA: Diagnosis not present

## 2022-03-24 LAB — POCT RAPID STREP A (OFFICE): Rapid Strep A Screen: NEGATIVE

## 2022-03-24 MED ORDER — AMOXICILLIN-POT CLAVULANATE 875-125 MG PO TABS: 1.0000 | ORAL_TABLET | Freq: Two times a day (BID) | ORAL | 0 refills | Status: DC

## 2022-03-24 NOTE — Progress Notes (Signed)
Established Patient Office Visit  Subjective:   Patient ID: Veronica [name], female    DOB: 1950-08-26  Age: 73 y.o. MRN: [id number]  CC:  Chief Complaint  Patient presents with   Sinus Problem    HPI: Veronica [name] is a 72 y.o. female presenting on 03/24/2022 for Sinus Problem   Sinus Problem    Six days ago started with fatigue and not feeling well, since progressed to nasal congestion, sinus pressure. Low grade fever up to 100.0 last night, states last night with chills. Started a cough yesterday, dry non productive, with some mild chest congestion. Slight sore throat. No ear pain but fullness and popping sensation.   Taking mucinex with is helping some.  Is on doxycycline 100 mg twice daily for rosacea but has not helped her currently.  Tested herself for covid at home negative.  Is taking tylenol with her mucinex.      ROS: Negative unless specifically indicated above in HPI.   Relevant past medical history reviewed and updated as indicated.   Allergies and medications reviewed and updated.   Current Outpatient Medications:    amoxicillin-clavulanate (AUGMENTIN) 875-125 MG tablet, Take 1 tablet by mouth 2 (two) times daily., Disp: 20 tablet, Rfl: 0   Phenylephrine-DM-GG-APAP (MUCINEX FAST-MAX COLD FLU PO), Take by mouth daily. 1-2 every 4 hours PRN, Disp: , Rfl:    aspirin EC 81 MG EC tablet, Take 1 tablet (81 mg total) by mouth daily. Swallow whole., Disp: 90 tablet, Rfl: 3   augmented betamethasone dipropionate (DIPROLENE-AF) 0.05 % cream, Apply 1 application topically daily as needed (for skin irritation). , Disp: , Rfl:    cyclobenzaprine (FLEXERIL) 10 MG tablet, Take 10 mg by mouth 3 (three) times daily as needed for muscle spasms. , Disp: , Rfl: 0   diphenhydrAMINE (BENADRYL) 25 mg capsule, Take 25 mg by mouth every 6 (six) hours as needed for sleep., Disp: , Rfl:    estradiol (ESTRACE) 0.1 MG/GM vaginal cream, PLACE 1 APPLICATORFUL VAGINALLY DAILY AS NEEDED.,  Disp: 42.5 g, Rfl: 5   ezetimibe (ZETIA) 10 MG tablet, Take 1 tablet (10 mg total) by mouth daily., Disp: 90 tablet, Rfl: 0   famotidine (PEPCID) 20 MG tablet, Take 20 mg by mouth 2 (two) times daily as needed for heartburn or indigestion., Disp: , Rfl:    fish oil-omega-3 fatty acids 1000 MG capsule, Take 1 g by mouth daily., Disp: , Rfl:    halobetasol (ULTRAVATE) 0.05 % cream, Apply 1 application topically daily as needed (for skin irritation). , Disp: , Rfl:    hydrocortisone (ANUSOL-HC) 2.5 % rectal cream, Place 1 application rectally 2 (two) times daily. As needed, Disp: 30 g, Rfl: 5   hydrocortisone valerate cream (WESTCORT) 0.2 %, Apply 1 application topically 2 (two) times daily as needed (rash). , Disp: , Rfl:    isosorbide mononitrate (IMDUR) 30 MG 24 hr tablet, Take 1 tablet (30 mg total) by mouth daily., Disp: 90 tablet, Rfl: 3   levothyroxine (SYNTHROID) 88 MCG tablet, TAKE 1 TABLET BY MOUTH EVERY DAY IN THE MORNING ON EMPTY STOMACH FOR 90 DAYS, Disp: 90 tablet, Rfl: 0   losartan (COZAAR) 100 MG tablet, Take 1 tablet (100 mg total) by mouth daily., Disp: 90 tablet, Rfl: 1   metFORMIN (GLUCOPHAGE) 500 MG tablet, Take 1 tablet by mouth 2 (two) times daily., Disp: , Rfl:    metoprolol tartrate (LOPRESSOR) 25 MG tablet, Take 0.5 tablets (12.5 mg total) by mouth daily.,  Disp: 45 tablet, Rfl: 0   metroNIDAZOLE (METROCREAM) 0.75 % cream, SMARTSIG:1 Sparingly Topical Twice Daily, Disp: , Rfl:    Multiple Vitamins-Minerals (PRESERVISION AREDS PO), Take 1 capsule by mouth daily., Disp: , Rfl:    nitroGLYCERIN (NITROSTAT) 0.4 MG SL tablet, PLACE 1 TABLET (0.4 MG TOTAL) UNDER THE TONGUE EVERY 5 (FIVE) MINUTES AS NEEDED FOR CHEST PAIN., Disp: 25 tablet, Rfl: 3   NONFORMULARY OR COMPOUNDED ITEM, [facility]:  Antifungal topical - Terbinafine 3%, Fluconazole 2%, Tea Tree Oil 5%, Urea 10%, Ibuprofen 2%, in #46m DMSO suspension. Apply to affected toenail(s) once daily (at bedtime) or twice  daily, Disp: 30 each, Rfl: 11   ONETOUCH VERIO test strip, 1 each by Other route every other day. , Disp: , Rfl:    rosuvastatin (CRESTOR) 40 MG tablet, Take 1 tablet (40 mg total) by mouth daily., Disp: 90 tablet, Rfl: 0  Allergies  Allergen Reactions   Ace Inhibitors Cough   Prilosec [Omeprazole Magnesium] Other (See Comments)    Blisters and skin peels off SGerilyn [employer]    Sulfa Antibiotics Other (See Comments)    Blisters and skin peels off SGerilyn [employer]   Tramadol Hcl    Ultram [Tramadol Hcl]     Lapse of memory      Objective:   BP 130/78   Pulse 96   Temp 97.8 F (36.6 C) (Temporal)   Ht 5' 4"$  (1.626 m)   Wt 168 lb 12.8 oz (76.6 kg)   LMP 02/06/2010 Comment: bleeding 12-14-2018  SpO2 98%   BMI 28.97 kg/m    Physical Exam Constitutional:      General: She is not in acute distress.    Appearance: Normal appearance. She is normal weight. She is not ill-appearing, toxic-appearing or diaphoretic.  HENT:     Head: Normocephalic.     Right Ear: Hearing, tympanic membrane, ear canal and external ear normal. No middle ear effusion. Tympanic membrane is not erythematous.     Left Ear: Hearing and external ear normal. A middle ear effusion is present. Tympanic membrane is not erythematous.     Nose: Congestion present.     Right Turbinates: Enlarged and swollen.     Right Sinus: Maxillary sinus tenderness and frontal sinus tenderness present.     Left Sinus: No maxillary sinus tenderness or frontal sinus tenderness.     Mouth/Throat:     Mouth: Mucous membranes are dry.     Pharynx: Posterior oropharyngeal erythema present. No oropharyngeal exudate.     Tonsils: No tonsillar exudate or tonsillar abscesses. 0 on the right. 0 on the left.  Eyes:     Extraocular Movements: Extraocular movements intact.     Pupils: Pupils are equal, round, and reactive to light.  Cardiovascular:     Rate and Rhythm: Normal rate and regular rhythm.     Pulses: Normal pulses.     Heart  sounds: Normal heart sounds.  Pulmonary:     Effort: Pulmonary effort is normal.     Breath sounds: Normal breath sounds.  Musculoskeletal:     Cervical back: Normal range of motion.  Neurological:     General: No focal deficit present.     Mental Status: She is alert and oriented to person, place, and time. Mental status is at baseline.  Psychiatric:        Mood and Affect: Mood normal.        Behavior: Behavior normal.        Thought Content:  Thought content normal.        Judgment: Judgment normal.     Assessment & Plan:  Sore throat -     POCT rapid strep A  Acute non-recurrent pansinusitis Assessment & Plan: Recommendation to continue mucinex otc and delsym otc as well  Strep negative in office as well.  Prescription given for augmentin 875/125 mg po bid for ten days. Pt to continue tylenol/ibuprofen prn sinus pain. Continue with humidifier prn and steam showers recommended as well. instructed If no symptom improvement in 48 hours please f/u   Orders: -     Amoxicillin-Pot Clavulanate; Take 1 tablet by mouth 2 (two) times daily.  Dispense: 20 tablet; Refill: 0     Follow up plan: Return if symptoms worsen or fail to improve, for otherwise f/u as scheduled in august 2024.  Eugenia Pancoast, FNP

## 2022-03-24 NOTE — Patient Instructions (Signed)
Antibiotic sent to preferred pharmacy.   Please increase oral fluids, steamy hot shower/humidifier prn.  Please follow up if no improvement in 2-3 days.   It was a pleasure seeing you today! Please do not hesitate to reach out with any questions and or concerns.  Regards,   Eugenia Pancoast

## 2022-03-24 NOTE — Assessment & Plan Note (Signed)
Recommendation to continue mucinex otc and delsym otc as well  Strep negative in office as well.  Prescription given for augmentin 875/125 mg po bid for ten days. Pt to continue tylenol/ibuprofen prn sinus pain. Continue with humidifier prn and steam showers recommended as well. instructed If no symptom improvement in 48 hours please f/u

## 2022-05-23 ENCOUNTER — Other Ambulatory Visit: Payer: Self-pay | Admitting: Family

## 2022-05-23 NOTE — Telephone Encounter (Signed)
Refill request for metFORMIN (GLUCOPHAGE) 500 MG tablet. I don't see where this medication has been sent.  LV- 03/13/22 NV- 09/11/22 LR- Historical provider 06/10/20.

## 2022-06-01 ENCOUNTER — Other Ambulatory Visit: Payer: Self-pay | Admitting: Family

## 2022-06-06 ENCOUNTER — Other Ambulatory Visit: Payer: Self-pay | Admitting: Family

## 2022-06-14 ENCOUNTER — Telehealth: Payer: Self-pay | Admitting: *Deleted

## 2022-06-14 NOTE — Telephone Encounter (Signed)
   Pre-operative Risk Assessment    Patient Name: Veronica [name]  DOB: 10-06-1950 MRN: [id number]      Request for Surgical Clearance    Procedure:   BLEPHAROPLASTY, BOTH UPPER LIDS WITH FAT PAD  Date of Surgery:  Clearance 07/05/22                                 Surgeon:  DR. Art Buff Surgeon's Group or Practice Name:  [facility] Phone number:  [phone] Fax number:  [phone]   Type of Clearance Requested:   - Medical  - Pharmacy:  Hold Aspirin ASPIRIN IS NOT ON MED LIST, BUT IS MENTIONED THAT PT TOLD [facility] HE WAS TAKING    Type of Anesthesia:   IV SEDATOIN   Additional requests/questions:    Wilhemina Cash   06/14/2022, 12:06 PM

## 2022-06-14 NOTE — Telephone Encounter (Signed)
Left message on machine for pt to contact the office to make telephone appt.

## 2022-06-14 NOTE — Telephone Encounter (Signed)
   Name: Veronica [name]  DOB: [birth year]  MRN: [id number]  Primary Cardiologist: Christell Constant, MD   Preoperative team, please contact this patient and set up a phone call appointment for further preoperative risk assessment. Please obtain consent and complete medication review. Thank you for your help.  I confirm that guidance regarding antiplatelet and oral anticoagulation therapy has been completed and, if necessary, noted below.  Ideally aspirin should be continued without interruption, however if the bleeding risk is too great, aspirin may be held for 5-7 days prior to surgery. Please resume aspirin post operatively when it is felt to be safe from a bleeding standpoint.     Carlos Levering, NP 06/14/2022, 3:37 PM [facility]

## 2022-06-15 ENCOUNTER — Telehealth: Payer: Self-pay | Admitting: *Deleted

## 2022-06-15 ENCOUNTER — Telehealth: Payer: Self-pay | Admitting: Internal Medicine

## 2022-06-15 NOTE — Telephone Encounter (Signed)
Pt has been scheduled for tele pre op appt 06/22/22 @ 2:40. Med rec and consent are done.

## 2022-06-15 NOTE — Telephone Encounter (Signed)
Pt has been scheduled for tele pre op appt 06/22/22 @ 2:40. Med rec and consent are done.     Patient Consent for Virtual Visit        Veronica [name] has provided verbal consent on 06/15/2022 for a virtual visit (video or telephone).   CONSENT FOR VIRTUAL VISIT FOR:  Veronica [name]  By participating in this virtual visit I agree to the following:  I hereby voluntarily request, consent and authorize [facility] and its employed or contracted physicians, physician assistants, nurse practitioners or other licensed health care professionals (the Practitioner), to provide me with telemedicine health care services (the "Services") as deemed necessary by the treating Practitioner. I acknowledge and consent to receive the Services by the Practitioner via telemedicine. I understand that the telemedicine visit will involve communicating with the Practitioner through live audiovisual communication technology and the disclosure of certain medical information by electronic transmission. I acknowledge that I have been given the opportunity to request an in-person assessment or other available alternative prior to the telemedicine visit and am voluntarily participating in the telemedicine visit.  I understand that I have the right to withhold or withdraw my consent to the use of telemedicine in the course of my care at any time, without affecting my right to future care or treatment, and that the Practitioner or I may terminate the telemedicine visit at any time. I understand that I have the right to inspect all information obtained and/or recorded in the course of the telemedicine visit and may receive copies of available information for a reasonable fee.  I understand that some of the potential risks of receiving the Services via telemedicine include:  Delay or interruption in medical evaluation due to technological equipment failure or disruption; Information transmitted may not be sufficient (e.g. poor  resolution of images) to allow for appropriate medical decision making by the Practitioner; and/or  In rare instances, security protocols could fail, causing a breach of personal health information.  Furthermore, I acknowledge that it is my responsibility to provide information about my medical history, conditions and care that is complete and accurate to the best of my ability. I acknowledge that Practitioner's advice, recommendations, and/or decision may be based on factors not within their control, such as incomplete or inaccurate data provided by me or distortions of diagnostic images or specimens that may result from electronic transmissions. I understand that the practice of medicine is not an exact science and that Practitioner makes no warranties or guarantees regarding treatment outcomes. I acknowledge that a copy of this consent can be made available to me via my patient portal [facility] MyChart), or I can request a printed copy by calling the office of [facility].    I understand that my insurance will be billed for this visit.   I have read or had this consent read to me. I understand the contents of this consent, which adequately explains the benefits and risks of the Services being provided via telemedicine.  I have been provided ample opportunity to ask questions regarding this consent and the Services and have had my questions answered to my satisfaction. I give my informed consent for the services to be provided through the use of telemedicine in my medical care

## 2022-06-15 NOTE — Telephone Encounter (Signed)
Patient is returning call about tele visit.

## 2022-06-16 LAB — HM DEXA SCAN

## 2022-06-19 ENCOUNTER — Encounter: Payer: Self-pay | Admitting: Family Medicine

## 2022-06-22 ENCOUNTER — Ambulatory Visit: Payer: Medicare Other | Attending: Interventional Cardiology | Admitting: Nurse Practitioner

## 2022-06-22 ENCOUNTER — Encounter: Payer: Self-pay | Admitting: Nurse Practitioner

## 2022-06-22 DIAGNOSIS — Z01810 Encounter for preprocedural cardiovascular examination: Secondary | ICD-10-CM | POA: Diagnosis not present

## 2022-06-22 NOTE — Progress Notes (Signed)
Virtual Visit via Telephone Note   Because of Veronica [name]'s co-morbid illnesses, she is at least at moderate risk for complications without adequate follow up.  This format is felt to be most appropriate for this patient at this time.  The patient did not have access to video technology/had technical difficulties with video requiring transitioning to audio format only (telephone).  All issues noted in this document were discussed and addressed.  No physical exam could be performed with this format.  Please refer to the patient's chart for her consent to telehealth for [facility].  Evaluation Performed:  Preoperative cardiovascular risk assessment _____________   Date:  06/22/2022   Patient ID:  Veronica [name], DOB August 18, 1950, MRN [id number] Patient Location:  Home Provider location:   Office  Primary Care Provider:  Mort Sawyers, FNP Primary Cardiologist:  Christell Constant, MD  Chief Complaint / Patient Profile   72 y.o. y/o female with a h/o CAD with prior stent to mLAD with repeat cath 11/2019 that revealed nonobstructive CAD with prior stent to LAD patent, NAFLD, HTN, HLD who is pending blepharoplasty, both upper lids with fat pad and presents today for telephonic preoperative cardiovascular risk assessment.  History of Present Illness    Veronica [name] is a 72 y.o. female who presents via audio/video conferencing for a telehealth visit today.  Pt was last seen in cardiology clinic on 01/06/2022 by Dr. Raynelle Jan.  At that time Veronica [name] was doing well.  The patient is now pending procedure as outlined above. Since her last visit, she denies chest pain, shortness of breath, lower extremity edema, fatigue, palpitations, melena, hematuria, hemoptysis, diaphoresis, weakness, presyncope, syncope, orthopnea, and PND. She remains active riding stationary bike and walking for exercise as well as working for [employer] on [employer]. She  is able to achieve > 4 METS activity  without concerning cardiac symptoms.   Past Medical History    Past Medical History:  Diagnosis Date   Angina, class IV (HCC) 01/2014   Anxiety    "menopausal related", history of   CAD S/P mLAD Bifurcation PCI: Resolute DES 2.25 mm x 18 mm(2.75 mm), 2.0 mm Angiosculpt of Diag  01/08/2014   [place] 0,1,1 midLAD-Diag lesion - progressed from Moderate to Severe 90% LAD, 70-80% Diag from 6-12 2015 --> Class IV Angina --> DES PCI with Angiosculpt PCI of Diag  FFR of m-dRCA 50-70% = 0.9    Complication of anesthesia    extreme sore throat and hoarseness after aneathesia, also "woke up screamimg" after surgery once"   Coronary artery disease involving native coronary artery with angina pectoris with documented spasm (HCC) 06/2013   h/o moderate 3 Vessel CAD with mLAD-Diag bifurcation ([place] 0,1,1)   Diabetes mellitus without complication (HCC)    Essential hypertension    stress test 10 yrs ago.   pcp   dr Caryn Bee little   GERD (gastroesophageal reflux disease)    Headache    Migraines childhood   History of migraine headaches    "as a child"   Hyperlipemia    Hyperthyroidism    "had radioactive iodine tx in the 1980's"   Hypothyroidism    Osteoarthritis    "joints ache"   Osteopenia    Past Surgical History:  Procedure Laterality Date   BACK SURGERY     BREAST CYST ASPIRATION Left 04/1999   "done in dr's office"   CARDIAC CATHETERIZATION  06/2013   CARDIAC CATHETERIZATION  01/08/2014   Procedure:  INTRAVASCULAR PRESSURE WIRE/FFR STUDY;  Surgeon: Lesleigh Noe, MD;  Location: [facility] CATH LAB;  Service: Cardiovascular;;  distal RCA   CARDIAC CATHETERIZATION  01/08/2014   Procedure: CORONARY BALLOON ANGIOPLASTY;  Surgeon: Lesleigh Noe, MD;  Location: [facility] CATH LAB;  Service: Cardiovascular;;  diag   COLONOSCOPY     CORONARY ANGIOPLASTY WITH STENT PLACEMENT  01/08/2014   "1"   DILATATION & CURETTAGE/HYSTEROSCOPY WITH MYOSURE N/A 03/24/2016   Procedure: DILATATION &  CURETTAGE/HYSTEROSCOPY WITH MYOSURE;  Surgeon: Jerene Bears, MD;  Location: [facility] ORS;  Service: Gynecology;  Laterality: N/A;   DILATION AND CURETTAGE OF UTERUS     LAPAROSCOPIC CHOLECYSTECTOMY  05/1995   LEFT HEART CATH AND CORONARY ANGIOGRAPHY N/A 11/19/2019   Procedure: LEFT HEART CATH AND CORONARY ANGIOGRAPHY;  Surgeon: Swaziland, Peter M, MD;  Location: [facility] INVASIVE CV LAB;  Service: Cardiovascular;  Laterality: N/A;   LEFT HEART CATHETERIZATION WITH CORONARY ANGIOGRAM N/A 07/04/2013   Procedure: LEFT HEART CATHETERIZATION WITH CORONARY ANGIOGRAM;  Surgeon: Lesleigh Noe, MD;  Location: [facility] CATH LAB;  Service: Cardiovascular;  Laterality: N/A;   LEFT HEART CATHETERIZATION WITH CORONARY ANGIOGRAM N/A 01/08/2014   Procedure: LEFT HEART CATHETERIZATION WITH CORONARY ANGIOGRAM;  Surgeon: Lesleigh Noe, MD;  Location: [facility] CATH LAB;  Service: Cardiovascular;  Laterality: N/A;   MOUTH SURGERY     PERCUTANEOUS STENT INTERVENTION  01/08/2014   Procedure: PERCUTANEOUS STENT INTERVENTION;  Surgeon: Lesleigh Noe, MD;  Location: [facility] CATH LAB;  Service: Cardiovascular;;  MID LAD   POSTERIOR CERVICAL FUSION/FORAMINOTOMY  04/04/2011   Procedure: POSTERIOR CERVICAL FUSION/FORAMINOTOMY LEVEL 1;  Surgeon: Temple Pacini, MD;  Location: [facility] NEURO ORS;  Service: Neurosurgery;  Laterality: Left;  Left Cervical Six-Seven Laminectomy, Foraminotomy, Diskectomy    TONSILLECTOMY     WISDOM TOOTH EXTRACTION     WRIST FRACTURE SURGERY Right 1990's    Allergies  Allergies  Allergen Reactions   Ace Inhibitors Cough   Prilosec [Omeprazole Magnesium] Other (See Comments)    Blisters and skin peels off Andria Meuse johnsons    Sulfa Antibiotics Other (See Comments)    Blisters and skin peels off Andria Meuse johnsons   Tramadol Hcl    Ultram [Tramadol Hcl]     Lapse of memory      Home Medications    Prior to Admission medications   Medication Sig Start Date End Date Taking? Authorizing Provider   amoxicillin-clavulanate (AUGMENTIN) 875-125 MG tablet Take 1 tablet by mouth 2 (two) times daily. 03/24/22   Mort Sawyers, FNP  aspirin EC 81 MG EC tablet Take 1 tablet (81 mg total) by mouth daily. Swallow whole. 11/19/19   [facility], Roe Rutherford, PA  augmented betamethasone dipropionate (DIPROLENE-AF) 0.05 % cream Apply 1 application topically daily as needed (for skin irritation).  09/16/13   [provider]  cyclobenzaprine (FLEXERIL) 10 MG tablet Take 10 mg by mouth 3 (three) times daily as needed for muscle spasms.  11/26/13   [provider]  diphenhydrAMINE (BENADRYL) 25 mg capsule Take 25 mg by mouth every 6 (six) hours as needed for sleep.    [provider]  estradiol (ESTRACE) 0.1 MG/GM vaginal cream PLACE 1 APPLICATORFUL VAGINALLY DAILY AS NEEDED. 02/05/22   Anyanwu, Jethro Bastos, MD  ezetimibe (ZETIA) 10 MG tablet TAKE 1 TABLET BY MOUTH EVERY DAY 06/07/22   Mort Sawyers, FNP  famotidine (PEPCID) 20 MG tablet TAKE 1 TABLET BY MOUTH TWICE A DAY 06/01/22   Mort Sawyers, FNP  fish oil-omega-3 fatty acids 1000 MG capsule Take 1 g by mouth daily.    [provider]  halobetasol (ULTRAVATE) 0.05 % cream Apply 1 application topically daily as needed (for skin irritation).  09/18/13   [provider]  hydrocortisone (ANUSOL-HC) 2.5 % rectal cream Place 1 application rectally 2 (two) times daily. As needed 05/31/20   Anyanwu, Jethro Bastos, MD  hydrocortisone valerate cream (WESTCORT) 0.2 % Apply 1 application topically 2 (two) times daily as needed (rash).  04/21/13   [provider]  isosorbide mononitrate (IMDUR) 30 MG 24 hr tablet Take 1 tablet (30 mg total) by mouth daily. 03/21/22   Christell Constant, MD  levothyroxine (SYNTHROID) 88 MCG tablet TAKE 1 TABLET BY MOUTH EVERY DAY IN THE MORNING ON EMPTY STOMACH FOR 90 DAYS 06/07/22   Mort Sawyers, FNP  losartan (COZAAR) 100 MG tablet Take 1 tablet (100 mg total) by mouth daily. 03/13/22   Mort Sawyers, FNP  metFORMIN (GLUCOPHAGE) 500 MG tablet TAKE 1 TABLET BY MOUTH TWICE A DAY WITH MEALS 05/24/22   Mort Sawyers, FNP  metoprolol tartrate (LOPRESSOR) 25 MG tablet TAKE 1/2 TABLET BY MOUTH EVERY DAY 06/07/22   Mort Sawyers, FNP  metroNIDAZOLE (METROCREAM) 0.75 % cream SMARTSIG:1 Sparingly Topical Twice Daily 02/09/22   [provider]  Multiple Vitamins-Minerals (PRESERVISION AREDS PO) Take 1 capsule by mouth daily.    [provider]  nitroGLYCERIN (NITROSTAT) 0.4 MG SL tablet PLACE 1 TABLET (0.4 MG TOTAL) UNDER THE TONGUE EVERY 5 (FIVE) MINUTES AS NEEDED FOR CHEST PAIN. 11/20/19   Lyn Records, MD  NONFORMULARY OR COMPOUNDED ITEM [facility]:  Antifungal topical - Terbinafine 3%, Fluconazole 2%, Tea Tree Oil 5%, Urea 10%, Ibuprofen 2%, in #32ml DMSO suspension. Apply to affected toenail(s) once daily (at bedtime) or twice daily 01/21/19   Vivi Barrack, DPM  [facility] VERIO test strip 1 each by Other route every other day.  09/08/19   [provider]  Phenylephrine-DM-GG-APAP (MUCINEX FAST-MAX COLD FLU PO) Take by mouth daily. 1-2 every 4 hours PRN    [provider]  rosuvastatin (CRESTOR) 40 MG tablet TAKE 1 TABLET BY MOUTH EVERY DAY 06/07/22   Mort Sawyers, FNP    Physical Exam    Vital Signs:  Neola A Muter does not have vital signs available for review today.  Given telephonic nature of communication, physical exam is limited. AAOx3. NAD. Normal affect.  Speech and respirations are unlabored.  Accessory Clinical Findings    None  Assessment & Plan    1.  Preoperative Cardiovascular Risk Assessment: According to the Revised Cardiac Risk Index (RCRI), her Perioperative Risk of Major Cardiac Event is (%): 0.9. Her Functional Capacity in METs is: 6.61 according to the [facility] Activity Status Index (DASI). The patient is doing well from a cardiac perspective. Therefore, based on ACC/AHA guidelines, the patient would be at acceptable risk  for the planned procedure without further cardiovascular testing.   The patient was advised that if she develops new symptoms prior to surgery to contact our office to arrange for a follow-up visit, and she verbalized understanding.  Ideally aspirin should be continued without interruption, however if the bleeding risk is too great, aspirin may be held for 5-7 days prior to surgery. Please resume aspirin post operatively when it is felt to be safe from a bleeding standpoint.   A copy of this note will be routed to requesting surgeon.  Time:   Today, I have spent  10 minutes with the patient with telehealth technology discussing medical history, symptoms, and management plan.    Levi Aland, NP-C  06/22/2022, 2:36 PM [address] Office [phone] Fax [phone]

## 2022-08-04 ENCOUNTER — Other Ambulatory Visit: Payer: Self-pay | Admitting: Family

## 2022-08-24 ENCOUNTER — Telehealth: Payer: Self-pay | Admitting: Podiatry

## 2022-08-24 NOTE — Telephone Encounter (Signed)
Pt called stating that [facility] has contacted you about a refill of [facility]:  Antifungal topical - Terbinafine 3%, Fluconazole 2%, Tea Tree Oil 5%, Urea 10%, Ibuprofen 2%, in #48ml DMSO suspension and they have not received anything back. Can this be refilled? Her last appt was 7.24.2023. I told pt she may have to schedule an appt to get the refill and she said that is not what you told her.She said she was told she could get refills on this when needed.

## 2022-09-02 ENCOUNTER — Other Ambulatory Visit: Payer: Self-pay | Admitting: Family

## 2022-09-11 ENCOUNTER — Encounter: Payer: Self-pay | Admitting: Family

## 2022-09-11 ENCOUNTER — Ambulatory Visit (INDEPENDENT_AMBULATORY_CARE_PROVIDER_SITE_OTHER): Payer: Medicare Other | Admitting: Family

## 2022-09-11 VITALS — BP 130/78 | HR 74 | Temp 98.6°F | Ht 64.0 in | Wt 170.0 lb

## 2022-09-11 DIAGNOSIS — E1169 Type 2 diabetes mellitus with other specified complication: Secondary | ICD-10-CM

## 2022-09-11 DIAGNOSIS — I10 Essential (primary) hypertension: Secondary | ICD-10-CM

## 2022-09-11 DIAGNOSIS — Z6828 Body mass index (BMI) 28.0-28.9, adult: Secondary | ICD-10-CM

## 2022-09-11 DIAGNOSIS — Z7984 Long term (current) use of oral hypoglycemic drugs: Secondary | ICD-10-CM

## 2022-09-11 DIAGNOSIS — E039 Hypothyroidism, unspecified: Secondary | ICD-10-CM | POA: Diagnosis not present

## 2022-09-11 DIAGNOSIS — N952 Postmenopausal atrophic vaginitis: Secondary | ICD-10-CM

## 2022-09-11 DIAGNOSIS — E663 Overweight: Secondary | ICD-10-CM

## 2022-09-11 DIAGNOSIS — E782 Mixed hyperlipidemia: Secondary | ICD-10-CM | POA: Diagnosis not present

## 2022-09-11 LAB — COMPREHENSIVE METABOLIC PANEL
ALT: 32 U/L (ref 0–35)
AST: 25 U/L (ref 0–37)
Albumin: 4.4 g/dL (ref 3.5–5.2)
Alkaline Phosphatase: 82 U/L (ref 39–117)
BUN: 19 mg/dL (ref 6–23)
CO2: 27 mEq/L (ref 19–32)
Calcium: 9.8 mg/dL (ref 8.4–10.5)
Chloride: 104 mEq/L (ref 96–112)
Creatinine, Ser: 0.89 mg/dL (ref 0.40–1.20)
GFR: 65.7 mL/min (ref >60.00)
Glucose, Bld: 114 mg/dL — ABNORMAL HIGH (ref 70–99)
Potassium: 4.1 mEq/L (ref 3.5–5.1)
Sodium: 139 mEq/L (ref 135–145)
Total Bilirubin: 0.7 mg/dL (ref 0.2–1.2)
Total Protein: 6.9 g/dL (ref 6.0–8.3)

## 2022-09-11 LAB — LIPID PANEL
Cholesterol: 126 mg/dL (ref 0–200)
HDL: 52.9 mg/dL (ref >39.00)
LDL Cholesterol: 50 mg/dL (ref 0–99)
NonHDL: 73.39
Total CHOL/HDL Ratio: 2
Triglycerides: 118 mg/dL (ref 0.0–149.0)
VLDL: 23.6 mg/dL (ref 0.0–40.0)

## 2022-09-11 LAB — POCT GLYCOSYLATED HEMOGLOBIN (HGB A1C): Hemoglobin A1C: 6.8 % — AB (ref 4.0–5.6)

## 2022-09-11 MED ORDER — METFORMIN HCL 1000 MG PO TABS
1000.0000 mg | ORAL_TABLET | Freq: Two times a day (BID) | ORAL | 3 refills | Status: DC
Start: 2022-09-11 — End: 2023-09-27

## 2022-09-11 NOTE — Assessment & Plan Note (Signed)
Continue medications as prescribed.

## 2022-09-11 NOTE — Progress Notes (Signed)
Established Patient Office Visit  Subjective:      CC:  Chief Complaint  Patient presents with   Medical Management of Chronic Issues    HPI: Veronica [name] is a 72 y.o. female presenting on 09/11/2022 for Medical Management of Chronic Issues . Hypothyroid: doing well on levothyroxine 88 mcg once daily.  Lab Results  Component Value Date   TSH 1.55 09/30/2021   Mixed hyperlipidemia: doing well on crestor 40 mg and zetia 10 mg. Only mildly elevated with triglycerdies. Ldl at goal  Lab Results  Component Value Date   CHOL 117 09/30/2021   HDL 51.00 09/30/2021   LDLCALC 33 09/30/2021   TRIG 168.0 (H) 09/30/2021   CHOLHDL 2 09/30/2021   Urine microalbumin, positive. On losartan 100 mg once daily.   Dm2: doing well. Last eye exam was December, negative per pt.  Metformin 500 mg twice daily.  Lab Results  Component Value Date   HGBA1C 6.8 (A) 09/11/2022    Last metabolic panel Lab Results  Component Value Date   GLUCOSE 103 (H) 09/30/2021   NA 138 09/30/2021   K 3.9 09/30/2021   CL 102 09/30/2021   CO2 23 09/30/2021   BUN 15 09/30/2021   CREATININE 0.85 09/30/2021   GFR 69.90 09/30/2021   CALCIUM 9.9 09/30/2021   PROT 6.7 09/30/2021   ALBUMIN 4.5 09/30/2021   BILITOT 0.8 09/30/2021   ALKPHOS 84 09/30/2021   AST 27 09/30/2021   ALT 35 09/30/2021   ANIONGAP 7 11/19/2019        Social history:  Relevant past medical, surgical, family and social history reviewed and updated as indicated. Interim medical history since our last visit reviewed.  Allergies and medications reviewed and updated.  DATA REVIEWED: CHART IN EPIC     ROS: Negative unless specifically indicated above in HPI.    Current Outpatient Medications:    aspirin EC 81 MG EC tablet, Take 1 tablet (81 mg total) by mouth daily. Swallow whole., Disp: 90 tablet, Rfl: 3   augmented betamethasone dipropionate (DIPROLENE-AF) 0.05 % cream, Apply 1 application topically daily as needed (for  skin irritation). , Disp: , Rfl:    cyclobenzaprine (FLEXERIL) 10 MG tablet, Take 10 mg by mouth 3 (three) times daily as needed for muscle spasms. , Disp: , Rfl: 0   diphenhydrAMINE (BENADRYL) 25 mg capsule, Take 25 mg by mouth every 6 (six) hours as needed for sleep., Disp: , Rfl:    estradiol (ESTRACE) 0.1 MG/GM vaginal cream, PLACE 1 APPLICATORFUL VAGINALLY DAILY AS NEEDED., Disp: 42.5 g, Rfl: 5   ezetimibe (ZETIA) 10 MG tablet, TAKE 1 TABLET BY MOUTH EVERY DAY, Disp: 90 tablet, Rfl: 0   famotidine (PEPCID) 20 MG tablet, TAKE 1 TABLET BY MOUTH TWICE A DAY, Disp: 180 tablet, Rfl: 3   fish oil-omega-3 fatty acids 1000 MG capsule, Take 1 g by mouth daily., Disp: , Rfl:    hydrocortisone (ANUSOL-HC) 2.5 % rectal cream, Place 1 application rectally 2 (two) times daily. As needed, Disp: 30 g, Rfl: 5   hydrocortisone valerate cream (WESTCORT) 0.2 %, Apply 1 application topically 2 (two) times daily as needed (rash). , Disp: , Rfl:    isosorbide mononitrate (IMDUR) 30 MG 24 hr tablet, Take 1 tablet (30 mg total) by mouth daily., Disp: 90 tablet, Rfl: 3   levothyroxine (SYNTHROID) 88 MCG tablet, TAKE 1 TABLET BY MOUTH EVERY DAY IN THE MORNING ON EMPTY STOMACH FOR 90 DAYS, Disp: 90 tablet, Rfl: 0  losartan (COZAAR) 100 MG tablet, TAKE 1 TABLET BY MOUTH EVERY DAY, Disp: 90 tablet, Rfl: 3   metFORMIN (GLUCOPHAGE) 1000 MG tablet, Take 1 tablet (1,000 mg total) by mouth 2 (two) times daily with a meal., Disp: 180 tablet, Rfl: 3   metoprolol tartrate (LOPRESSOR) 25 MG tablet, TAKE 1/2 TABLET BY MOUTH EVERY DAY, Disp: 135 tablet, Rfl: 3   metroNIDAZOLE (METROCREAM) 0.75 % cream, SMARTSIG:1 Sparingly Topical Twice Daily, Disp: , Rfl:    Multiple Vitamins-Minerals (PRESERVISION AREDS PO), Take 1 capsule by mouth daily., Disp: , Rfl:    nitroGLYCERIN (NITROSTAT) 0.4 MG SL tablet, PLACE 1 TABLET (0.4 MG TOTAL) UNDER THE TONGUE EVERY 5 (FIVE) MINUTES AS NEEDED FOR CHEST PAIN., Disp: 25 tablet, Rfl: 3   NONFORMULARY  OR COMPOUNDED ITEM, [facility]:  Antifungal topical - Terbinafine 3%, Fluconazole 2%, Tea Tree Oil 5%, Urea 10%, Ibuprofen 2%, in #25ml DMSO suspension. Apply to affected toenail(s) once daily (at bedtime) or twice daily, Disp: 30 each, Rfl: 11   ONETOUCH VERIO test strip, 1 each by Other route every other day. , Disp: , Rfl:    Phenylephrine-DM-GG-APAP (MUCINEX FAST-MAX COLD FLU PO), Take by mouth daily. 1-2 every 4 hours PRN, Disp: , Rfl:    rosuvastatin (CRESTOR) 40 MG tablet, TAKE 1 TABLET BY MOUTH EVERY DAY, Disp: 90 tablet, Rfl: 0   halobetasol (ULTRAVATE) 0.05 % cream, Apply 1 application topically daily as needed (for skin irritation). , Disp: , Rfl:       Objective:    BP 130/78   Pulse 74   Temp 98.6 F (37 C) (Temporal)   Ht 5\' 4"  (1.626 m)   Wt 170 lb (77.1 kg)   LMP 02/06/2010 Comment: bleeding 12-14-2018  SpO2 98%   BMI 29.18 kg/m   Wt Readings from Last 3 Encounters:  09/11/22 170 lb (77.1 kg)  03/24/22 168 lb 12.8 oz (76.6 kg)  03/13/22 166 lb 3.2 oz (75.4 kg)    Physical Exam Constitutional:      General: She is not in acute distress.    Appearance: Normal appearance. She is obese. She is not ill-appearing, toxic-appearing or diaphoretic.  HENT:     Head: Normocephalic.  Cardiovascular:     Rate and Rhythm: Normal rate and regular rhythm.  Pulmonary:     Effort: Pulmonary effort is normal.  Musculoskeletal:        General: Normal range of motion.     Right lower leg: No edema.     Left lower leg: No edema.  Neurological:     General: No focal deficit present.     Mental Status: She is alert and oriented to person, place, and time. Mental status is at baseline.  Psychiatric:        Mood and Affect: Mood normal.        Behavior: Behavior normal.        Thought Content: Thought content normal.        Judgment: Judgment normal.         Diabetic Foot Form - Detailed   Diabetic Foot Exam - detailed Can the patient see the bottom of their  feet?: Yes Are the shoes appropriate in style and fit?: Yes Is there swelling or and abnormal foot shape?: No Is there a claw toe deformity?: No Is there elevated skin temparature?: No Is there foot or ankle muscle weakness?: No Normal Range of Motion: Yes Right posterior Tibialias: Present Left posterior Tibialias: Present   Right Dorsalis  Pedis: Present Left Dorsalis Pedis: Present  Semmes-Weinstein Monofilament Test R Site 1-Great Toe: Pos L Site 1-Great Toe: Pos           Assessment & Plan:  Type 2 diabetes mellitus with other specified complication, without long-term current use of insulin (HCC) Assessment & Plan: Ordered hga1c today pending results. Work on diabetic diet and exercise as tolerated. Yearly foot exam, and annual eye exam.   Increase metformin to 1000 mg twice daily.   Orders: -     POCT glycosylated hemoglobin (Hb A1C) -     Comprehensive metabolic panel -     Lipid panel -     metFORMIN HCl; Take 1 tablet (1,000 mg total) by mouth 2 (two) times daily with a meal.  Dispense: 180 tablet; Refill: 3  Mixed hyperlipidemia Assessment & Plan: Continue crestor 40 mg nightly  Ordered lipid panel, pending results. Work on low cholesterol diet and exercise as tolerated   Orders: -     Lipid panel  Hypothyroidism, unspecified type Assessment & Plan: Continue levothyroxine 88 mcg once daily.  Recent tsh normal limits.    Essential hypertension Assessment & Plan: Continue medications as prescribed.     Vaginal atrophy Assessment & Plan: Stable.     Overweight with body mass index (BMI) of 28 to 28.9 in adult Assessment & Plan: Pt advised to work on diet and exercise as tolerated       Return in about 6 months (around 03/14/2023) for f/u CPE.  Mort Sawyers, MSN, APRN, FNP-C [facility]

## 2022-09-11 NOTE — Assessment & Plan Note (Signed)
Stable

## 2022-09-11 NOTE — Assessment & Plan Note (Signed)
Continue levothyroxine 88 mcg once daily.  Recent tsh normal limits.

## 2022-09-11 NOTE — Assessment & Plan Note (Signed)
Pt advised to work on diet and exercise as tolerated  

## 2022-09-11 NOTE — Assessment & Plan Note (Addendum)
Ordered hga1c today pending results. Work on diabetic diet and exercise as tolerated. Yearly foot exam, and annual eye exam.   Increase metformin to 1000 mg twice daily.

## 2022-09-11 NOTE — Assessment & Plan Note (Signed)
Continue crestor 40 mg nightly  Ordered lipid panel, pending results. Work on low cholesterol diet and exercise as tolerated

## 2022-09-11 NOTE — Patient Instructions (Signed)
  Increase metformin to 1000 mg once in the am , and one 500 mg nightly.  If you are tolerating well increase to 1000 mg twice daily.    Regards,   Mort Sawyers FNP-C

## 2022-11-03 ENCOUNTER — Other Ambulatory Visit: Payer: Self-pay | Admitting: Family

## 2022-11-29 ENCOUNTER — Other Ambulatory Visit: Payer: Self-pay | Admitting: Internal Medicine

## 2022-12-03 ENCOUNTER — Other Ambulatory Visit: Payer: Self-pay | Admitting: Family

## 2022-12-04 ENCOUNTER — Other Ambulatory Visit: Payer: Self-pay | Admitting: Family

## 2022-12-05 ENCOUNTER — Encounter: Payer: Self-pay | Admitting: Family

## 2022-12-05 ENCOUNTER — Ambulatory Visit (INDEPENDENT_AMBULATORY_CARE_PROVIDER_SITE_OTHER): Payer: Medicare Other | Admitting: Family

## 2022-12-05 VITALS — BP 122/68 | HR 78 | Temp 97.5°F | Ht 63.0 in | Wt 169.9 lb

## 2022-12-05 DIAGNOSIS — L03221 Cellulitis of neck: Secondary | ICD-10-CM | POA: Diagnosis not present

## 2022-12-05 DIAGNOSIS — L0291 Cutaneous abscess, unspecified: Secondary | ICD-10-CM | POA: Insufficient documentation

## 2022-12-05 MED ORDER — DOXYCYCLINE HYCLATE 100 MG PO TABS: 100.0000 mg | ORAL_TABLET | Freq: Two times a day (BID) | ORAL | 0 refills | Status: AC

## 2022-12-05 MED ORDER — TRIAMCINOLONE ACETONIDE 0.1 % EX CREA: 1.0000 | TOPICAL_CREAM | Freq: Two times a day (BID) | CUTANEOUS | 0 refills | Status: DC

## 2022-12-05 NOTE — Patient Instructions (Signed)
Please monitor site for worsening signs/symptoms of infection to include: increasing redness, increasing tenderness, increase in size, and or pustulant drainage from site. If this is to occur please let me know immediately.  °

## 2022-12-05 NOTE — Assessment & Plan Note (Signed)
Suspected abscess from potential insect bite RX doxycycline 100 mg po bid x 10 days Please monitor site for worsening signs/symptoms of infection to include: increasing redness, increasing tenderness, increase in size, and or pustulant drainage from site. If this is to occur please let me know immediately.

## 2022-12-05 NOTE — Progress Notes (Signed)
Established Patient Office Visit  Subjective:      CC:  Chief Complaint  Patient presents with   Nodule    Pt noticed a nodule on the back of her neck on Sunday. Denies any pain at the site of the nodule.    HPI: Veronica [name] is a 71 y.o. female presenting on 12/05/2022 for Nodule (Pt noticed a nodule on the back of her neck on Sunday. Denies any pain at the site of the nodule.) . Three days ago noticed a few 'knots' on the back of her neck. Thought maybe a bug bite, were pretty sore. She states with some improvement since then, but has discomfort in her neck that has spread.   Slight headache. Thre days ago in the afternoon with chills.  Did not check her temperature, no sore throat or ear pain.  Did put in a cough drop, has been having a dry cough a bit.   Social history:  Relevant past medical, surgical, family and social history reviewed and updated as indicated. Interim medical history since our last visit reviewed.  Allergies and medications reviewed and updated.  DATA REVIEWED: CHART IN EPIC     ROS: Negative unless specifically indicated above in HPI.    Current Outpatient Medications:    doxycycline (VIBRA-TABS) 100 MG tablet, Take 1 tablet (100 mg total) by mouth 2 (two) times daily for 10 days., Disp: 20 tablet, Rfl: 0   triamcinolone cream (KENALOG) 0.1 %, Apply 1 Application topically 2 (two) times daily., Disp: 30 g, Rfl: 0   aspirin EC 81 MG EC tablet, Take 1 tablet (81 mg total) by mouth daily. Swallow whole., Disp: 90 tablet, Rfl: 3   augmented betamethasone dipropionate (DIPROLENE-AF) 0.05 % cream, Apply 1 application topically daily as needed (for skin irritation). , Disp: , Rfl:    cyclobenzaprine (FLEXERIL) 10 MG tablet, Take 10 mg by mouth 3 (three) times daily as needed for muscle spasms. , Disp: , Rfl: 0   diphenhydrAMINE (BENADRYL) 25 mg capsule, Take 25 mg by mouth every 6 (six) hours as needed for sleep., Disp: , Rfl:    estradiol (ESTRACE)  0.1 MG/GM vaginal cream, PLACE 1 APPLICATORFUL VAGINALLY DAILY AS NEEDED., Disp: 42.5 g, Rfl: 5   ezetimibe (ZETIA) 10 MG tablet, TAKE 1 TABLET BY MOUTH EVERY DAY, Disp: 90 tablet, Rfl: 3   famotidine (PEPCID) 20 MG tablet, TAKE 1 TABLET BY MOUTH TWICE A DAY, Disp: 180 tablet, Rfl: 3   fish oil-omega-3 fatty acids 1000 MG capsule, Take 1 g by mouth daily., Disp: , Rfl:    halobetasol (ULTRAVATE) 0.05 % cream, Apply 1 application topically daily as needed (for skin irritation). , Disp: , Rfl:    hydrocortisone (ANUSOL-HC) 2.5 % rectal cream, Place 1 application rectally 2 (two) times daily. As needed, Disp: 30 g, Rfl: 5   hydrocortisone valerate cream (WESTCORT) 0.2 %, Apply 1 application topically 2 (two) times daily as needed (rash). , Disp: , Rfl:    isosorbide mononitrate (IMDUR) 30 MG 24 hr tablet, TAKE 1 TABLET BY MOUTH EVERY DAY, Disp: 90 tablet, Rfl: 3   levothyroxine (SYNTHROID) 88 MCG tablet, TAKE 1 TABLET BY MOUTH EVERY DAY IN THE MORNING ON EMPTY STOMACH FOR 90 DAYS, Disp: 90 tablet, Rfl: 0   losartan (COZAAR) 100 MG tablet, TAKE 1 TABLET BY MOUTH EVERY DAY, Disp: 90 tablet, Rfl: 3   metFORMIN (GLUCOPHAGE) 1000 MG tablet, Take 1 tablet (1,000 mg total) by mouth 2 (two) times  daily with a meal., Disp: 180 tablet, Rfl: 3   metoprolol tartrate (LOPRESSOR) 25 MG tablet, TAKE 1/2 TABLET BY MOUTH EVERY DAY, Disp: 135 tablet, Rfl: 3   metroNIDAZOLE (METROCREAM) 0.75 % cream, SMARTSIG:1 Sparingly Topical Twice Daily, Disp: , Rfl:    Multiple Vitamins-Minerals (PRESERVISION AREDS PO), Take 1 capsule by mouth daily., Disp: , Rfl:    nitroGLYCERIN (NITROSTAT) 0.4 MG SL tablet, PLACE 1 TABLET (0.4 MG TOTAL) UNDER THE TONGUE EVERY 5 (FIVE) MINUTES AS NEEDED FOR CHEST PAIN., Disp: 25 tablet, Rfl: 3   NONFORMULARY OR COMPOUNDED ITEM, [facility]:  Antifungal topical - Terbinafine 3%, Fluconazole 2%, Tea Tree Oil 5%, Urea 10%, Ibuprofen 2%, in #86ml DMSO suspension. Apply to affected toenail(s)  once daily (at bedtime) or twice daily, Disp: 30 each, Rfl: 11   ONETOUCH VERIO test strip, 1 each by Other route every other day. , Disp: , Rfl:    rosuvastatin (CRESTOR) 40 MG tablet, TAKE 1 TABLET BY MOUTH EVERY DAY, Disp: 90 tablet, Rfl: 3      Objective:    BP 122/68 (BP Location: Left Arm, Patient Position: Sitting, Cuff Size: Normal)   Pulse 78   Temp (!) 97.5 F (36.4 C) (Temporal)   Ht 5\' 3"  (1.6 m)   Wt 169 lb 14.4 oz (77.1 kg)   LMP 02/06/2010 Comment: bleeding 12-14-2018  SpO2 98%   BMI 30.10 kg/m   Wt Readings from Last 3 Encounters:  12/05/22 169 lb 14.4 oz (77.1 kg)  09/11/22 170 lb (77.1 kg)  03/24/22 168 lb 12.8 oz (76.6 kg)    Physical Exam Constitutional:      General: She is not in acute distress.    Appearance: Normal appearance. She is normal weight. She is not ill-appearing, toxic-appearing or diaphoretic.  HENT:     Head: Normocephalic.     Right Ear: Tympanic membrane normal.     Left Ear: Tympanic membrane normal.     Nose: Nose normal.     Mouth/Throat:     Mouth: Mucous membranes are dry.     Pharynx: No oropharyngeal exudate or posterior oropharyngeal erythema.  Eyes:     Extraocular Movements: Extraocular movements intact.     Pupils: Pupils are equal, round, and reactive to light.  Cardiovascular:     Rate and Rhythm: Normal rate and regular rhythm.     Pulses: Normal pulses.     Heart sounds: Normal heart sounds.  Pulmonary:     Effort: Pulmonary effort is normal.     Breath sounds: Normal breath sounds.  Musculoskeletal:     Cervical back: Normal range of motion.  Skin:    Comments: Posterior neck with two small raised red and warm nodules with surrounding induration. No discharge from site.   Neurological:     General: No focal deficit present.     Mental Status: She is alert and oriented to person, place, and time. Mental status is at baseline.  Psychiatric:        Mood and Affect: Mood normal.        Behavior: Behavior normal.         Thought Content: Thought content normal.        Judgment: Judgment normal.              Assessment & Plan:  Abscess Assessment & Plan: Suspected abscess from potential insect bite RX doxycycline 100 mg po bid x 10 days Please monitor site for worsening signs/symptoms of infection to include:  increasing redness, increasing tenderness, increase in size, and or pustulant drainage from site. If this is to occur please let me know immediately.    Orders: -     Doxycycline Hyclate; Take 1 tablet (100 mg total) by mouth 2 (two) times daily for 10 days.  Dispense: 20 tablet; Refill: 0 -     Triamcinolone Acetonide; Apply 1 Application topically 2 (two) times daily.  Dispense: 30 g; Refill: 0  Cellulitis of neck -     Triamcinolone Acetonide; Apply 1 Application topically 2 (two) times daily.  Dispense: 30 g; Refill: 0     Return if symptoms worsen or fail to improve.  Mort Sawyers, MSN, APRN, FNP-C [facility]

## 2023-01-11 ENCOUNTER — Other Ambulatory Visit: Payer: Self-pay | Admitting: Family

## 2023-01-16 ENCOUNTER — Ambulatory Visit: Payer: Medicare Other | Attending: Internal Medicine | Admitting: Internal Medicine

## 2023-01-16 ENCOUNTER — Encounter: Payer: Self-pay | Admitting: Internal Medicine

## 2023-01-16 ENCOUNTER — Ambulatory Visit (INDEPENDENT_AMBULATORY_CARE_PROVIDER_SITE_OTHER): Payer: Medicare Other

## 2023-01-16 VITALS — BP 130/68 | HR 90 | Ht 63.0 in | Wt 168.0 lb

## 2023-01-16 DIAGNOSIS — E782 Mixed hyperlipidemia: Secondary | ICD-10-CM

## 2023-01-16 DIAGNOSIS — R002 Palpitations: Secondary | ICD-10-CM

## 2023-01-16 DIAGNOSIS — I10 Essential (primary) hypertension: Secondary | ICD-10-CM

## 2023-01-16 DIAGNOSIS — Z9861 Coronary angioplasty status: Secondary | ICD-10-CM | POA: Diagnosis not present

## 2023-01-16 DIAGNOSIS — I2510 Atherosclerotic heart disease of native coronary artery without angina pectoris: Secondary | ICD-10-CM

## 2023-01-16 NOTE — Patient Instructions (Signed)
Medication Instructions:  Your physician recommends that you continue on your current medications as directed. Please refer to the Current Medication list given to you today.  *If you need a refill on your cardiac medications before your next appointment, please call your pharmacy*   Lab Work: NONE  If you have labs (blood work) drawn today and your tests are completely normal, you will receive your results only by: MyChart Message (if you have MyChart) OR A paper copy in the mail If you have any lab test that is abnormal or we need to change your treatment, we will call you to review the results.   Testing/Procedures: Your physician has requested that you wear a heart monitor.   Follow-Up: At [facility], you and your health needs are our priority.  As part of our continuing mission to provide you with exceptional heart care, we have created designated Provider Care Teams.  These Care Teams include your primary Cardiologist (physician) and Advanced Practice Providers (APPs -  Physician Assistants and Nurse Practitioners) who all work together to provide you with the care you need, when you need it.  We recommend signing up for the patient portal called "MyChart".  Sign up information is provided on this After Visit Summary.  MyChart is used to connect with patients for Virtual Visits (Telemedicine).  Patients are able to view lab/test results, encounter notes, upcoming appointments, etc.  Non-urgent messages can be sent to your provider as well.   To learn more about what you can do with MyChart, go to [email or web address].    Your next appointment:   4-5 month(s)  Provider:   Eligha Bridegroom, NP     Then, Christell Constant, MD will plan to see you again in 1 year(s).    Other Instructions ZIO XT- Long Term Monitor Instructions  Your physician has requested you wear a ZIO patch monitor for 14 days.  This is a single patch monitor. [employer] supplies one patch  monitor per enrollment. Additional stickers are not available. Please do not apply patch if you will be having a Nuclear Stress Test,   Cardiac CT, MRI, or Chest Xray during the period you would be wearing the  monitor. The patch cannot be worn during these tests. You cannot remove and re-apply the  ZIO XT patch monitor.  Your ZIO patch monitor will be mailed 3 day USPS to your address on file. It may take 3-5 days  to receive your monitor after you have been enrolled.  Once you have received your monitor, please review the enclosed instructions. Your monitor  has already been registered assigning a specific monitor serial # to you.  Billing and Patient Assistance Program Information  We have supplied [employer] with any of your insurance information on file for billing purposes. [employer] offers a sliding scale Patient Assistance Program for patients that do not have  insurance, or whose insurance does not completely cover the cost of the ZIO monitor.  You must apply for the Patient Assistance Program to qualify for this discounted rate.  To apply, please call [employer] at [phone], select option 4, select option 2, ask to apply for  Patient Assistance Program. Meredeth Ide will ask your household income, and how many people  are in your household. They will quote your out-of-pocket cost based on that information.  [employer] will also be able to set up a 13-month, interest-free payment plan if needed.  Applying the monitor   Shave hair from upper left chest.  Hold  abrader disc by orange tab. Rub abrader in 40 strokes over the upper left chest as  indicated in your monitor instructions.  Clean area with 4 enclosed alcohol pads. Let dry.  Apply patch as indicated in monitor instructions. Patch will be placed under collarbone on left  side of chest with arrow pointing upward.  Rub patch adhesive wings for 2 minutes. Remove white label marked "1". Remove the white  label marked "2". Rub patch  adhesive wings for 2 additional minutes.  While looking in a mirror, press and release button in center of patch. A small green light will  flash 3-4 times. This will be your only indicator that the monitor has been turned on.  Do not shower for the first 24 hours. You may shower after the first 24 hours.  Press the button if you feel a symptom. You will hear a small click. Record Date, Time and  Symptom in the Patient Logbook.  When you are ready to remove the patch, follow instructions on the last 2 pages of Patient  Logbook. Stick patch monitor onto the last page of Patient Logbook.  Place Patient Logbook in the blue and white box. Use locking tab on box and tape box closed  securely. The blue and white box has prepaid postage on it. Please place it in the mailbox as  soon as possible. Your physician should have your test results approximately 7 days after the  monitor has been mailed back to [facility].  Call [facility] Customer Care at [phone] if you have questions regarding  your ZIO XT patch monitor. Call them immediately if you see an orange light blinking on your  monitor.  If your monitor falls off in less than 4 days, contact our Monitor department at [phone].  If your monitor becomes loose or falls off after 4 days call [employer] at [phone] for  suggestions on securing your monitor

## 2023-01-16 NOTE — Progress Notes (Unsigned)
Enrolled for [employer] to mail a ZIO XT long term holter monitor to the patients address on file.  

## 2023-01-16 NOTE — Progress Notes (Signed)
Cardiology Office Note:    Date:  01/16/2023   ID:  AHLAAM HARTON, DOB [birth year], MRN [id number]  PCP:  Mort Sawyers, FNP   Veronica [name] Cardiologist:  Christell Constant, MD     Referring MD: Mort Sawyers, FNP   CC: New palpitations  History of Present Illness:    Veronica [name] is a 72 y.o. female with a hx of CAD with prior PCI, HTN, HLD. And NAFLD.  Previously saw Dr. Katrinka Blazing.   2023: Established with me, no sx.  Discussed the use of AI scribe software for clinical note transcription with the patient, who gave verbal consent to proceed.  [name], with a history of coronary disease, diabetes, and hypertension, presents with new onset palpitations occurring mainly in the evening and sometimes waking her from sleep. These episodes have been occurring almost daily for the past month. The patient describes the sensation as her heart "flip-flopping" and sometimes feeling rapid. These episodes occur at rest and resolve spontaneously, although deep breathing may hasten resolution. The patient denies any associated chest pain, dyspnea, or syncope. She did experience one episode of feeling unwell at an outdoor event, but this was attributed to her diabetes and resolved with eating. The patient's coronary disease has been asymptomatic since an increase in her Imdur dosage two to three years ago. She has not required nitroglycerin for chest pain. Her blood pressure and cholesterol are well controlled on her current medication regimen.   Past Medical History:  Diagnosis Date   Angina, class IV (HCC) 01/2014   Anxiety    "menopausal related", history of   CAD S/P mLAD Bifurcation PCI: Resolute DES 2.25 mm x 18 mm(2.75 mm), 2.0 mm Angiosculpt of Diag  01/08/2014   [place] 0,1,1 midLAD-Diag lesion - progressed from Moderate to Severe 90% LAD, 70-80% Diag from 6-12 2015 --> Class IV Angina --> DES PCI with Angiosculpt PCI of Diag  FFR of m-dRCA 50-70% = 0.9    Complication  of anesthesia    extreme sore throat and hoarseness after aneathesia, also "woke up screamimg" after surgery once"   Coronary artery disease involving native coronary artery with angina pectoris with documented spasm (HCC) 06/2013   h/o moderate 3 Vessel CAD with mLAD-Diag bifurcation ([place] 0,1,1)   Diabetes mellitus without complication (HCC)    Essential hypertension    stress test 10 yrs ago.   pcp   dr Caryn Bee little   GERD (gastroesophageal reflux disease)    Headache    Migraines childhood   History of migraine headaches    "as a child"   Hyperlipemia    Hyperthyroidism    "had radioactive iodine tx in the 1980's"   Hypothyroidism    Osteoarthritis    "joints ache"   Osteopenia     Past Surgical History:  Procedure Laterality Date   BACK SURGERY     BREAST CYST ASPIRATION Left 04/1999   "done in dr's office"   CARDIAC CATHETERIZATION  06/2013   CARDIAC CATHETERIZATION  01/08/2014   Procedure: INTRAVASCULAR PRESSURE WIRE/FFR STUDY;  Surgeon: Lesleigh Noe, MD;  Location: [facility] CATH LAB;  Service: Cardiovascular;;  distal RCA   CARDIAC CATHETERIZATION  01/08/2014   Procedure: CORONARY BALLOON ANGIOPLASTY;  Surgeon: Lesleigh Noe, MD;  Location: [facility] CATH LAB;  Service: Cardiovascular;;  diag   COLONOSCOPY     CORONARY ANGIOPLASTY WITH STENT PLACEMENT  01/08/2014   "1"   DILATATION & CURETTAGE/HYSTEROSCOPY WITH MYOSURE N/A 03/24/2016  Procedure: DILATATION & CURETTAGE/HYSTEROSCOPY WITH MYOSURE;  Surgeon: Jerene Bears, MD;  Location: [facility] ORS;  Service: Gynecology;  Laterality: N/A;   DILATION AND CURETTAGE OF UTERUS     LAPAROSCOPIC CHOLECYSTECTOMY  05/1995   LEFT HEART CATH AND CORONARY ANGIOGRAPHY N/A 11/19/2019   Procedure: LEFT HEART CATH AND CORONARY ANGIOGRAPHY;  Surgeon: Swaziland, Peter M, MD;  Location: [facility] INVASIVE CV LAB;  Service: Cardiovascular;  Laterality: N/A;   LEFT HEART CATHETERIZATION WITH CORONARY ANGIOGRAM N/A 07/04/2013   Procedure: LEFT HEART  CATHETERIZATION WITH CORONARY ANGIOGRAM;  Surgeon: Lesleigh Noe, MD;  Location: [facility] CATH LAB;  Service: Cardiovascular;  Laterality: N/A;   LEFT HEART CATHETERIZATION WITH CORONARY ANGIOGRAM N/A 01/08/2014   Procedure: LEFT HEART CATHETERIZATION WITH CORONARY ANGIOGRAM;  Surgeon: Lesleigh Noe, MD;  Location: [facility] CATH LAB;  Service: Cardiovascular;  Laterality: N/A;   MOUTH SURGERY     PERCUTANEOUS STENT INTERVENTION  01/08/2014   Procedure: PERCUTANEOUS STENT INTERVENTION;  Surgeon: Lesleigh Noe, MD;  Location: [facility] CATH LAB;  Service: Cardiovascular;;  MID LAD   POSTERIOR CERVICAL FUSION/FORAMINOTOMY  04/04/2011   Procedure: POSTERIOR CERVICAL FUSION/FORAMINOTOMY LEVEL 1;  Surgeon: Temple Pacini, MD;  Location: [facility] NEURO ORS;  Service: Neurosurgery;  Laterality: Left;  Left Cervical Six-Seven Laminectomy, Foraminotomy, Diskectomy    TONSILLECTOMY     WISDOM TOOTH EXTRACTION     WRIST FRACTURE SURGERY Right 1990's    Current Medications: Current Meds  Medication Sig   aspirin EC 81 MG EC tablet Take 1 tablet (81 mg total) by mouth daily. Swallow whole.   augmented betamethasone dipropionate (DIPROLENE-AF) 0.05 % cream Apply 1 application topically daily as needed (for skin irritation).    cyclobenzaprine (FLEXERIL) 10 MG tablet Take 10 mg by mouth 3 (three) times daily as needed for muscle spasms.    diphenhydrAMINE (BENADRYL) 25 mg capsule Take 25 mg by mouth every 6 (six) hours as needed for sleep.   estradiol (ESTRACE) 0.1 MG/GM vaginal cream PLACE 1 APPLICATORFUL VAGINALLY DAILY AS NEEDED.   ezetimibe (ZETIA) 10 MG tablet TAKE 1 TABLET BY MOUTH EVERY DAY   famotidine (PEPCID) 20 MG tablet TAKE 1 TABLET BY MOUTH TWICE A DAY   fish oil-omega-3 fatty acids 1000 MG capsule Take 1 g by mouth daily.   halobetasol (ULTRAVATE) 0.05 % cream Apply 1 application topically daily as needed (for skin irritation).    hydrocortisone (ANUSOL-HC) 2.5 % rectal cream Place 1 application rectally 2  (two) times daily. As needed   hydrocortisone valerate cream (WESTCORT) 0.2 % Apply 1 application topically 2 (two) times daily as needed (rash).    isosorbide mononitrate (IMDUR) 30 MG 24 hr tablet TAKE 1 TABLET BY MOUTH EVERY DAY   levothyroxine (SYNTHROID) 88 MCG tablet TAKE 1 TABLET BY MOUTH EVERY DAY IN THE MORNING ON EMPTY STOMACH FOR 90 DAYS   losartan (COZAAR) 100 MG tablet TAKE 1 TABLET BY MOUTH EVERY DAY   metFORMIN (GLUCOPHAGE) 1000 MG tablet Take 1 tablet (1,000 mg total) by mouth 2 (two) times daily with a meal.   metoprolol tartrate (LOPRESSOR) 25 MG tablet TAKE 1/2 TABLET BY MOUTH EVERY DAY   metroNIDAZOLE (METROCREAM) 0.75 % cream SMARTSIG:1 Sparingly Topical Twice Daily   Multiple Vitamins-Minerals (PRESERVISION AREDS PO) Take 1 capsule by mouth daily.   nitroGLYCERIN (NITROSTAT) 0.4 MG SL tablet PLACE 1 TABLET (0.4 MG TOTAL) UNDER THE TONGUE EVERY 5 (FIVE) MINUTES AS NEEDED FOR CHEST PAIN.   NONFORMULARY OR COMPOUNDED ITEM  [facility]:  Antifungal topical - Terbinafine 3%, Fluconazole 2%, Tea Tree Oil 5%, Urea 10%, Ibuprofen 2%, in #52ml DMSO suspension. Apply to affected toenail(s) once daily (at bedtime) or twice daily   ONETOUCH VERIO test strip 1 each by Other route every other day.    rosuvastatin (CRESTOR) 40 MG tablet TAKE 1 TABLET BY MOUTH EVERY DAY   triamcinolone cream (KENALOG) 0.1 % Apply 1 Application topically 2 (two) times daily.     Allergies:   Ace inhibitors, Prilosec [omeprazole magnesium], Sulfa antibiotics, Tramadol hcl, and Ultram [tramadol hcl]   Social History   Socioeconomic History   Marital status: Widowed    Spouse name: Ree Kida   Number of children: 2   Years of education: some college   Highest education level: Not on file  Occupational History   Not on file  Tobacco Use   Smoking status: Never   Smokeless tobacco: Never  Vaping Use   Vaping status: Never Used  Substance and Sexual Activity   Alcohol use: No   Drug use: No    Sexual activity: Yes    Partners: Male    Birth control/protection: Post-menopausal  Other Topics Concern   Not on file  Social History Narrative   06/17/21   From: the area   Living: by herself currently, husband passed away 2021/10/19  Work: retired - [employer] fundraiser      Family: 2 sons - [place] and [place] - 7 grandchildren      Enjoys: work at [employer], spend time with family, go to ball games, watch the grandchildren, reading, crafts      Exercise: walking based on weather, stationary bike 30 minutes daily   Diet: water diet - no fats/salt/carbs      Safety   Seat belts: Yes    Guns: No   Safe in relationships: Yes       Social Determinants of Corporate investment banker Strain: Not on file  Food Insecurity: Not on file  Transportation Needs: Not on file  Physical Activity: Not on file  Stress: Not on file  Social Connections: Not on file     Family History: The patient's family history includes Asthma in her sister; Diabetes type II in her father; Heart Problems in her sister; Heart disease in her father and mother; Hypertension in her brother, father, mother, sister, and sister; Parkinson's disease in her father and mother.  ROS:   Please see the history of present illness.     EKGs/Labs/Other Studies Reviewed:    The following studies were reviewed today: EKG WNL today  Cardiac Studies & Procedures   CARDIAC CATHETERIZATION  CARDIAC CATHETERIZATION 11/19/2019  Narrative  Prox LAD to Mid LAD lesion is 25% stenosed.  Previously placed Mid LAD stent (unknown type) is widely patent.  Ost Cx to Prox Cx lesion is 30% stenosed.  1st Mrg lesion is 25% stenosed.  Mid Cx to Dist Cx lesion is 25% stenosed.  LV end diastolic pressure is normal.  1. Nonobstructive CAD. Prior stent in the LAD is patent 2. Normal LVEDP.  Plan: consider other causes for her symptoms. Continue risk factor modification.  Findings Coronary Findings Diagnostic  Dominance: Right  Left  Main Vessel was injected. Vessel is normal in caliber. Vessel is angiographically normal.  Left Anterior Descending Prox LAD to Mid LAD lesion is 25% stenosed. Previously placed Mid LAD stent (unknown type) is widely patent.  Left Circumflex Ost Cx to Prox Cx lesion is 30% stenosed.  Mid Cx to Dist Cx lesion is 25% stenosed.  First Obtuse Marginal Branch 1st Mrg lesion is 25% stenosed.  Right Coronary Artery Vessel was injected. Vessel is normal in caliber. The vessel exhibits minimal luminal irregularities.  Intervention  No interventions have been documented.   STRESS TESTS  MYOCARDIAL PERFUSION IMAGING 02/22/2017  Narrative  Nuclear stress EF: 65%.  Blood pressure demonstrated a normal response to exercise.  No T wave inversion was noted during stress.  There was no ST segment deviation noted during stress.  Defect 1: There is a medium defect of moderate severity.  This is a low risk study.  Medium size, moderate intensity fixed septal perfusion defect, likely artifact. No reversible ischemia. LVEF 65% with normal wall motion. This is a low risk study.   ECHOCARDIOGRAM  ECHOCARDIOGRAM COMPLETE 11/18/2019  Narrative ECHOCARDIOGRAM REPORT    Patient Name:   NICKOLA WILLAUER Date of Exam: 11/18/2019 Medical Rec #:  [id number]     Height:       64.0 in Accession #:    [id number]    Weight:       170.0 lb Date of Birth:  02/26/1950     BSA:          1.826 m Patient Age:    69 years      BP:           145/67 mmHg Patient Gender: F             HR:           63 bpm. Exam Location:  Inpatient  Procedure: 2D Echo, Cardiac Doppler and Color Doppler  Indications:    Chest pain  History:        Patient has no prior history of Echocardiogram examinations. Angina and CAD, Signs/Symptoms:Chest Pain; Risk Factors:Hypertension, Dyslipidemia and Diabetes.  Sonographer:    Ross Ludwig RDCS (AE) Referring Phys: [id number] CADENCE H FURTH   Sonographer Comments: Suboptimal  subcostal window. IMPRESSIONS   1. Left ventricular ejection fraction, by estimation, is 60 to 65%. The left ventricle has normal function. The left ventricle has no regional wall motion abnormalities. Left ventricular diastolic parameters are consistent with Grade I diastolic dysfunction (impaired relaxation). 2. Right ventricular systolic function is normal. The right ventricular size is normal. 3. Left atrial size was mildly dilated. 4. The mitral valve is normal in structure. No evidence of mitral valve regurgitation. No evidence of mitral stenosis. 5. The aortic valve is normal in structure. Aortic valve regurgitation is not visualized. No aortic stenosis is present. 6. The inferior vena cava is normal in size with greater than 50% respiratory variability, suggesting right atrial pressure of 3 mmHg.  FINDINGS Left Ventricle: Left ventricular ejection fraction, by estimation, is 60 to 65%. The left ventricle has normal function. The left ventricle has no regional wall motion abnormalities. The left ventricular internal cavity size was normal in size. There is no left ventricular hypertrophy. Left ventricular diastolic parameters are consistent with Grade I diastolic dysfunction (impaired relaxation).  Right Ventricle: The right ventricular size is normal. No increase in right ventricular wall thickness. Right ventricular systolic function is normal.  Left Atrium: Left atrial size was mildly dilated.  Right Atrium: Right atrial size was normal in size.  Pericardium: There is no evidence of pericardial effusion.  Mitral Valve: The mitral valve is normal in structure. No evidence of mitral valve regurgitation. No evidence of mitral valve stenosis. MV peak gradient, 3.9 mmHg. The mean mitral valve  gradient is 1.0 mmHg.  Tricuspid Valve: The tricuspid valve is normal in structure. Tricuspid valve regurgitation is not demonstrated. No evidence of tricuspid stenosis.  Aortic Valve: The  aortic valve is normal in structure. Aortic valve regurgitation is not visualized. No aortic stenosis is present. Aortic valve mean gradient measures 3.0 mmHg. Aortic valve peak gradient measures 5.4 mmHg. Aortic valve area, by VTI measures 2.21 cm.  Pulmonic Valve: The pulmonic valve was normal in structure. Pulmonic valve regurgitation is not visualized. No evidence of pulmonic stenosis.  Aorta: The aortic root is normal in size and structure.  Venous: The inferior vena cava is normal in size with greater than 50% respiratory variability, suggesting right atrial pressure of 3 mmHg.  IAS/Shunts: No atrial level shunt detected by color flow Doppler.   LEFT VENTRICLE PLAX 2D LVIDd:         3.60 cm  Diastology LVIDs:         2.30 cm  LV e' medial:    6.64 cm/s LV PW:         1.20 cm  LV E/e' medial:  11.0 LV IVS:        1.30 cm  LV e' lateral:   7.94 cm/s LVOT diam:     1.80 cm  LV E/e' lateral: 9.2 LV SV:         53 LV SV Index:   29 LVOT Area:     2.54 cm   RIGHT VENTRICLE RV Basal diam:  3.20 cm RV S prime:     10.40 cm/s TAPSE (M-mode): 2.4 cm  LEFT ATRIUM             Index       RIGHT ATRIUM           Index LA diam:        3.10 cm 1.70 cm/m  RA Area:     16.20 cm LA Vol (A2C):   32.3 ml 17.69 ml/m RA Volume:   40.40 ml  22.13 ml/m LA Vol (A4C):   53.8 ml 29.47 ml/m LA Biplane Vol: 41.8 ml 22.89 ml/m AORTIC VALVE AV Area (Vmax):    1.98 cm AV Area (Vmean):   1.99 cm AV Area (VTI):     2.21 cm AV Vmax:           116.00 cm/s AV Vmean:          81.300 cm/s AV VTI:            0.242 m AV Peak Grad:      5.4 mmHg AV Mean Grad:      3.0 mmHg LVOT Vmax:         90.10 cm/s LVOT Vmean:        63.700 cm/s LVOT VTI:          0.210 m LVOT/AV VTI ratio: 0.87  AORTA Ao Root diam: 3.10 cm Ao Asc diam:  2.80 cm  MITRAL VALVE MV Area (PHT): 3.53 cm    SHUNTS MV Peak grad:  3.9 mmHg    Systemic VTI:  0.21 m MV Mean grad:  1.0 mmHg    Systemic Diam: 1.80 cm MV Vmax:        0.99 m/s MV Vmean:      47.5 cm/s MV Decel Time: 215 msec MV E velocity: 72.80 cm/s MV A velocity: 91.30 cm/s MV E/A ratio:  0.80  Mihai Croitoru MD Electronically signed by Thurmon Fair MD Signature Date/Time: 11/18/2019/7:34:04 PM  Final             Recent Labs: 09/11/2022: ALT 32; BUN 19; Creatinine, Ser 0.89; Potassium 4.1; Sodium 139  Recent Lipid Panel    Component Value Date/Time   CHOL 126 09/11/2022 0857   TRIG 118.0 09/11/2022 0857   HDL 52.90 09/11/2022 0857   CHOLHDL 2 09/11/2022 0857   VLDL 23.6 09/11/2022 0857   LDLCALC 50 09/11/2022 0857        Physical Exam:    VS:  BP 130/68   Pulse 90   Ht 5\' 3"  (1.6 m)   Wt 168 lb (76.2 kg)   LMP 02/06/2010 Comment: bleeding 12-14-2018  SpO2 97%   BMI 29.76 kg/m     Wt Readings from Last 3 Encounters:  01/16/23 168 lb (76.2 kg)  12/05/22 169 lb 14.4 oz (77.1 kg)  09/11/22 170 lb (77.1 kg)   GEN:  Well nourished, well developed in no acute distress HEENT: Normal NECK: No JVD CARDIAC: RRR, no murmurs, rubs, gallops RESPIRATORY:  Clear to auscultation without rales, wheezing or rhonchi  ABDOMEN: Soft, non-tender, non-distended MUSCULOSKELETAL:  No edema; No deformity  SKIN: Warm and dry NEUROLOGIC:  Alert and oriented x 3 PSYCHIATRIC:  Normal affect   ASSESSMENT:    1. Palpitations   2. CAD S/P percutaneous coronary angioplasty   3. Essential hypertension   4. Mixed hyperlipidemia    PLAN:    Palpitations - Intermittent palpitations primarily in the evening and occasionally nocturnal. No associated chest pain, dyspnea, or syncope. Differential includes SVT, atrial fibrillation, or anxiety. History of coronary artery disease, well-controlled hypertension, and hyperlipidemia. Discussed potential beta blocker augmentation if symptoms persist. Explained treatment options for SVT and atrial fibrillation, including blood thinners if necessary. Discussed two-week heart monitor to capture arrhythmias,  with possible empirical beta blocker increase if no arrhythmia is found. - Order two-week heart monitor to rule out atrial fibrillation and detect SVT - Consider augmenting beta blocker therapy if symptoms persist and no arrhythmia is found  Coronary Artery Disease with HLD - Well-controlled coronary artery disease with no angina. Blood pressure and cholesterol are well managed. No chest pain since Imdur increase two to three years ago., may increase BB dose based on above - Continue current medications  Diabetes Mellitus Diabetes mellitus with a recent hypoglycemic episode resolved by eating. No further episodes reported. - Monitor blood glucose levels - Ensure proper diabetes management to prevent hypoglycemia  General Health Maintenance Blood pressure and cholesterol are well controlled. Investigating new palpitations. - Continue monitoring blood pressure and cholesterol levels - Encourage adherence to current medication regimen  Follow-up Spring with Eligha Bridegroom; then one year with me unless AF or AFL   Medication Adjustments/Labs and Tests Ordered: Current medicines are reviewed at length with the patient today.  Concerns regarding medicines are outlined above.  Orders Placed This Encounter  Procedures   LONG TERM MONITOR (3-14 DAYS)   EKG 12-Lead   No orders of the defined types were placed in this encounter.   Patient Instructions  Medication Instructions:  Your physician recommends that you continue on your current medications as directed. Please refer to the Current Medication list given to you today.  *If you need a refill on your cardiac medications before your next appointment, please call your pharmacy*   Lab Work: NONE  If you have labs (blood work) drawn today and your tests are completely normal, you will receive your results only by: MyChart Message (if you have MyChart)  OR A paper copy in the mail If you have any lab test that is abnormal or we need  to change your treatment, we will call you to review the results.   Testing/Procedures: Your physician has requested that you wear a heart monitor.   Follow-Up: At [facility], you and your health needs are our priority.  As part of our continuing mission to provide you with exceptional heart care, we have created designated Provider Care Teams.  These Care Teams include your primary Cardiologist (physician) and [name] (APPs -  Physician Assistants and Nurse Practitioners) who all work together to provide you with the care you need, when you need it.  We recommend signing up for the patient portal called "MyChart".  Sign up information is provided on this After Visit Summary.  MyChart is used to connect with patients for Virtual Visits (Telemedicine).  Patients are able to view lab/test results, encounter notes, upcoming appointments, etc.  Non-urgent messages can be sent to your provider as well.   To learn more about what you can do with MyChart, go to [email or web address].    Your next appointment:   4-5 month(s)  Provider:   Eligha Bridegroom, NP     Then, Christell Constant, MD will plan to see you again in 1 year(s).    Other Instructions ZIO XT- Long Term Monitor Instructions  Your physician has requested you wear a ZIO patch monitor for 14 days.  This is a single patch monitor. [employer] supplies one patch monitor per enrollment. Additional stickers are not available. Please do not apply patch if you will be having a Nuclear Stress Test,   Cardiac CT, MRI, or Chest Xray during the period you would be wearing the  monitor. The patch cannot be worn during these tests. You cannot remove and re-apply the  ZIO XT patch monitor.  Your ZIO patch monitor will be mailed 3 day USPS to your address on file. It may take 3-5 days  to receive your monitor after you have been enrolled.  Once you have received your monitor, please review the enclosed  instructions. Your monitor  has already been registered assigning a specific monitor serial # to you.  Billing and Patient Assistance Program Information  We have supplied [employer] with any of your insurance information on file for billing purposes. [employer] offers a sliding scale Patient Assistance Program for patients that do not have  insurance, or whose insurance does not completely cover the cost of the ZIO monitor.  You must apply for the Patient Assistance Program to qualify for this discounted rate.  To apply, please call [employer] at [phone], select option 4, select option 2, ask to apply for  Patient Assistance Program. Meredeth Ide will ask your household income, and how many people  are in your household. They will quote your out-of-pocket cost based on that information.  [employer] will also be able to set up a 81-month, interest-free payment plan if needed.  Applying the monitor   Shave hair from upper left chest.  Hold abrader disc by orange tab. Rub abrader in 40 strokes over the upper left chest as  indicated in your monitor instructions.  Clean area with 4 enclosed alcohol pads. Let dry.  Apply patch as indicated in monitor instructions. Patch will be placed under collarbone on left  side of chest with arrow pointing upward.  Rub patch adhesive wings for 2 minutes. Remove white label marked "1". Remove the white  label marked "2".  Rub patch adhesive wings for 2 additional minutes.  While looking in a mirror, press and release button in center of patch. A small green light will  flash 3-4 times. This will be your only indicator that the monitor has been turned on.  Do not shower for the first 24 hours. You may shower after the first 24 hours.  Press the button if you feel a symptom. You will hear a small click. Record Date, Time and  Symptom in the Patient Logbook.  When you are ready to remove the patch, follow instructions on the last 2 pages of Patient  Logbook. Stick patch  monitor onto the last page of Patient Logbook.  Place Patient Logbook in the blue and white box. Use locking tab on box and tape box closed  securely. The blue and white box has prepaid postage on it. Please place it in the mailbox as  soon as possible. Your physician should have your test results approximately 7 days after the  monitor has been mailed back to [facility].  Call [facility] Customer Care at [phone] if you have questions regarding  your ZIO XT patch monitor. Call them immediately if you see an orange light blinking on your  monitor.  If your monitor falls off in less than 4 days, contact our Monitor department at [phone].  If your monitor becomes loose or falls off after 4 days call [employer] at [phone] for  suggestions on securing your monitor     Signed, Christell Constant, MD  01/16/2023 12:08 PM    [facility]

## 2023-01-18 DIAGNOSIS — R002 Palpitations: Secondary | ICD-10-CM

## 2023-02-12 ENCOUNTER — Encounter: Payer: Self-pay | Admitting: Internal Medicine

## 2023-02-12 MED ORDER — METOPROLOL TARTRATE 25 MG PO TABS: 25.0000 mg | ORAL_TABLET | Freq: Two times a day (BID) | ORAL | 3 refills | Status: AC

## 2023-03-02 ENCOUNTER — Other Ambulatory Visit: Payer: Self-pay | Admitting: Family

## 2023-03-20 ENCOUNTER — Ambulatory Visit: Payer: Medicare Other | Admitting: Family

## 2023-03-21 ENCOUNTER — Encounter: Payer: Self-pay | Admitting: Family

## 2023-03-21 ENCOUNTER — Ambulatory Visit (INDEPENDENT_AMBULATORY_CARE_PROVIDER_SITE_OTHER): Payer: Medicare Other | Admitting: Family

## 2023-03-21 VITALS — BP 118/74 | HR 80 | Temp 98.0°F | Ht 63.0 in | Wt 169.8 lb

## 2023-03-21 DIAGNOSIS — E039 Hypothyroidism, unspecified: Secondary | ICD-10-CM | POA: Diagnosis not present

## 2023-03-21 DIAGNOSIS — Z7984 Long term (current) use of oral hypoglycemic drugs: Secondary | ICD-10-CM

## 2023-03-21 DIAGNOSIS — Z0000 Encounter for general adult medical examination without abnormal findings: Secondary | ICD-10-CM

## 2023-03-21 DIAGNOSIS — E669 Obesity, unspecified: Secondary | ICD-10-CM

## 2023-03-21 DIAGNOSIS — R413 Other amnesia: Secondary | ICD-10-CM | POA: Diagnosis not present

## 2023-03-21 DIAGNOSIS — E1169 Type 2 diabetes mellitus with other specified complication: Secondary | ICD-10-CM | POA: Diagnosis not present

## 2023-03-21 DIAGNOSIS — R002 Palpitations: Secondary | ICD-10-CM

## 2023-03-21 DIAGNOSIS — I10 Essential (primary) hypertension: Secondary | ICD-10-CM

## 2023-03-21 DIAGNOSIS — E782 Mixed hyperlipidemia: Secondary | ICD-10-CM

## 2023-03-21 DIAGNOSIS — M8580 Other specified disorders of bone density and structure, unspecified site: Secondary | ICD-10-CM

## 2023-03-21 LAB — LIPID PANEL
Cholesterol: 122 mg/dL (ref 0–200)
HDL: 55.2 mg/dL (ref >39.00)
LDL Cholesterol: 44 mg/dL (ref 0–99)
NonHDL: 66.48
Total CHOL/HDL Ratio: 2
Triglycerides: 111 mg/dL (ref 0.0–149.0)
VLDL: 22.2 mg/dL (ref 0.0–40.0)

## 2023-03-21 LAB — TSH: TSH: 2.75 u[IU]/mL (ref 0.35–5.50)

## 2023-03-21 LAB — BASIC METABOLIC PANEL
BUN: 19 mg/dL (ref 6–23)
CO2: 26 meq/L (ref 19–32)
Calcium: 10.2 mg/dL (ref 8.4–10.5)
Chloride: 102 meq/L (ref 96–112)
Creatinine, Ser: 0.83 mg/dL (ref 0.40–1.20)
GFR: 71.18 mL/min (ref >60.00)
Glucose, Bld: 139 mg/dL — ABNORMAL HIGH (ref 70–99)
Potassium: 4.1 meq/L (ref 3.5–5.1)
Sodium: 139 meq/L (ref 135–145)

## 2023-03-21 LAB — VITAMIN B12: Vitamin B-12: 107 pg/mL — ABNORMAL LOW (ref 211–911)

## 2023-03-21 LAB — HEMOGLOBIN A1C: Hgb A1c MFr Bld: 7.5 % — ABNORMAL HIGH (ref 4.6–6.5)

## 2023-03-21 NOTE — Assessment & Plan Note (Signed)
Stable controlled with metoprolol  Following with cardiology

## 2023-03-21 NOTE — Assessment & Plan Note (Signed)
Continue crestor 40 mg nightly  Ordered lipid panel, pending results. Work on low cholesterol diet and exercise as tolerated

## 2023-03-21 NOTE — Assessment & Plan Note (Signed)
Tsh ordered pending results. Continue levothyroxine 88 mcg once daily

## 2023-03-21 NOTE — Assessment & Plan Note (Signed)
Continue medications as prescribed.

## 2023-03-21 NOTE — Progress Notes (Signed)
Subjective:  Patient ID: Veronica [name], female    DOB: [birth year]  Age: 73 y.o. MRN: [id number]  Patient Care Team: Mort Sawyers, FNP as PCP - General (Family Medicine) Christell Constant, MD as PCP - Cardiology (Cardiology)   CC:  Chief Complaint  Patient presents with   Annual Exam    HPI Veronica [name] is a 73 y.o. female who presents today for an annual physical exam. She reports consuming a general diet.  Stationary bike during the news on tv   She generally feels well. She reports sleeping fairly well. She does not have additional problems to discuss today.   Vision:Within last year Dental:Receives regular dental care  Mammogram: 06/16/22  Colonoscopy: 03/10/15 repeat 10 years  Bone density scan: 06/16/22  Pt is without acute concerns.  Recent zio monitor with cardiology she states showed pvc but cardiology advised her was not significant and metoprolol was increased. She is currently on 12.5 mg twice daily. She does state that she felt some short term memory loss when she tried to go to increased metoprolol dose, has since weaned down in the last one week, some residual but she does state improving.   Advanced Directives Patient does have advanced directives . She does not have a copy in the electronic medical record.   DEPRESSION SCREENING    12/05/2022   10:37 AM 09/11/2022    8:48 AM 03/13/2022   11:04 AM 03/13/2022    8:37 AM 06/17/2021   10:40 AM 05/02/2016    5:46 PM  PHQ 2/9 Scores  PHQ - 2 Score 0 0 0 0 0 0  PHQ- 9 Score 0 2 3 0       ROS: Negative unless specifically indicated above in HPI.    Current Outpatient Medications:    aspirin EC 81 MG EC tablet, Take 1 tablet (81 mg total) by mouth daily. Swallow whole., Disp: 90 tablet, Rfl: 3   augmented betamethasone dipropionate (DIPROLENE-AF) 0.05 % cream, Apply 1 application topically daily as needed (for skin irritation). , Disp: , Rfl:    cyclobenzaprine (FLEXERIL) 10 MG tablet, Take 10 mg by mouth 3  (three) times daily as needed for muscle spasms. , Disp: , Rfl: 0   diphenhydrAMINE (BENADRYL) 25 mg capsule, Take 25 mg by mouth every 6 (six) hours as needed for sleep., Disp: , Rfl:    estradiol (ESTRACE) 0.1 MG/GM vaginal cream, PLACE 1 APPLICATORFUL VAGINALLY DAILY AS NEEDED., Disp: 42.5 g, Rfl: 5   ezetimibe (ZETIA) 10 MG tablet, TAKE 1 TABLET BY MOUTH EVERY DAY, Disp: 90 tablet, Rfl: 3   famotidine (PEPCID) 20 MG tablet, TAKE 1 TABLET BY MOUTH TWICE A DAY, Disp: 180 tablet, Rfl: 3   fish oil-omega-3 fatty acids 1000 MG capsule, Take 1 g by mouth daily., Disp: , Rfl:    halobetasol (ULTRAVATE) 0.05 % cream, Apply 1 application topically daily as needed (for skin irritation). , Disp: , Rfl:    hydrocortisone (ANUSOL-HC) 2.5 % rectal cream, Place 1 application rectally 2 (two) times daily. As needed, Disp: 30 g, Rfl: 5   hydrocortisone valerate cream (WESTCORT) 0.2 %, Apply 1 application topically 2 (two) times daily as needed (rash). , Disp: , Rfl:    isosorbide mononitrate (IMDUR) 30 MG 24 hr tablet, TAKE 1 TABLET BY MOUTH EVERY DAY, Disp: 90 tablet, Rfl: 3   levothyroxine (SYNTHROID) 88 MCG tablet, TAKE 1 TABLET BY MOUTH EVERY DAY IN THE MORNING ON EMPTY STOMACH FOR 90 DAYS,  Disp: 90 tablet, Rfl: 0   losartan (COZAAR) 100 MG tablet, TAKE 1 TABLET BY MOUTH EVERY DAY, Disp: 90 tablet, Rfl: 3   metFORMIN (GLUCOPHAGE) 1000 MG tablet, Take 1 tablet (1,000 mg total) by mouth 2 (two) times daily with a meal., Disp: 180 tablet, Rfl: 3   metoprolol tartrate (LOPRESSOR) 25 MG tablet, Take 1 tablet (25 mg total) by mouth 2 (two) times daily., Disp: 180 tablet, Rfl: 3   metroNIDAZOLE (METROCREAM) 0.75 % cream, SMARTSIG:1 Sparingly Topical Twice Daily, Disp: , Rfl:    Multiple Vitamins-Minerals (PRESERVISION AREDS PO), Take 1 capsule by mouth daily., Disp: , Rfl:    nitroGLYCERIN (NITROSTAT) 0.4 MG SL tablet, PLACE 1 TABLET (0.4 MG TOTAL) UNDER THE TONGUE EVERY 5 (FIVE) MINUTES AS NEEDED FOR CHEST PAIN.,  Disp: 25 tablet, Rfl: 3   NONFORMULARY OR COMPOUNDED ITEM, [facility]:  Antifungal topical - Terbinafine 3%, Fluconazole 2%, Tea Tree Oil 5%, Urea 10%, Ibuprofen 2%, in #68ml DMSO suspension. Apply to affected toenail(s) once daily (at bedtime) or twice daily, Disp: 30 each, Rfl: 11   ONETOUCH VERIO test strip, 1 each by Other route every other day. , Disp: , Rfl:    rosuvastatin (CRESTOR) 40 MG tablet, TAKE 1 TABLET BY MOUTH EVERY DAY, Disp: 90 tablet, Rfl: 3   triamcinolone cream (KENALOG) 0.1 %, Apply 1 Application topically 2 (two) times daily., Disp: 30 g, Rfl: 0    Objective:    BP 118/74 (BP Location: Left Arm, Patient Position: Sitting, Cuff Size: Normal)   Pulse 80   Temp 98 F (36.7 C) (Temporal)   Ht 5\' 3"  (1.6 m)   Wt 169 lb 12.8 oz (77 kg)   LMP 02/06/2010 Comment: bleeding 12-14-2018  SpO2 98%   BMI 30.08 kg/m   BP Readings from Last 3 Encounters:  03/21/23 118/74  01/16/23 130/68  12/05/22 122/68      Physical Exam Constitutional:      General: She is not in acute distress.    Appearance: Normal appearance. She is obese. She is not ill-appearing.  HENT:     Head: Normocephalic.     Right Ear: Tympanic membrane normal.     Left Ear: Tympanic membrane normal.     Nose: Nose normal.     Mouth/Throat:     Mouth: Mucous membranes are moist.  Eyes:     Extraocular Movements: Extraocular movements intact.     Pupils: Pupils are equal, round, and reactive to light.  Cardiovascular:     Rate and Rhythm: Normal rate and regular rhythm.  Pulmonary:     Effort: Pulmonary effort is normal.     Breath sounds: Normal breath sounds.  Abdominal:     General: Abdomen is flat. Bowel sounds are normal.     Palpations: Abdomen is soft.     Tenderness: There is no guarding or rebound.  Musculoskeletal:        General: Normal range of motion.     Cervical back: Normal range of motion.  Skin:    General: Skin is warm.     Capillary Refill: Capillary refill takes  less than 2 seconds.  Neurological:     General: No focal deficit present.     Mental Status: She is alert.  Psychiatric:        Mood and Affect: Mood normal.        Behavior: Behavior normal.        Thought Content: Thought content normal.  Judgment: Judgment normal.          Assessment & Plan:  Short-term memory loss -     Vitamin B12  Essential hypertension Assessment & Plan: Continue medications as prescribed.     Acquired hypothyroidism Assessment & Plan: Tsh ordered pending results. Continue levothyroxine 88 mcg once daily   Orders: -     TSH  Type 2 diabetes mellitus with other specified complication, without long-term current use of insulin (HCC) -     Microalbumin / creatinine urine ratio -     Hemoglobin A1c -     Basic metabolic panel  Mixed hyperlipidemia Assessment & Plan: Continue crestor 40 mg nightly  Ordered lipid panel, pending results. Work on low cholesterol diet and exercise as tolerated   Orders: -     Lipid panel  Palpitations Assessment & Plan: Stable controlled with metoprolol  Following with cardiology    Obesity (BMI 30-39.9) Assessment & Plan: Pt advised to work on diet and exercise as tolerated    Osteopenia, unspecified location Assessment & Plan: Calcium and vitamin d , continue with daily.    Encounter for general adult medical examination without abnormal findings Assessment & Plan: Patient Counseling(The following topics were reviewed):  Preventative care handout given to pt  Health maintenance and immunizations reviewed. Please refer to Health maintenance section. Pt advised on safe sex, wearing seatbelts in car, and proper nutrition labwork ordered today for annual Dental health: Discussed importance of regular tooth brushing, flossing, and dental visits.     Title   Diabetic Foot Exam - detailed Is there a history of foot ulcer?: No Is there a foot ulcer now?: No Is there swelling?: No Is there  elevated skin temperature?: No Is there abnormal foot shape?: No Is there a claw toe deformity?: No Are the toenails long?: No Are the toenails thick?: Yes Are the toenails ingrown?: No Is the skin thin, fragile, shiny and hairless?": No Normal Range of Motion?: Yes Is there foot or ankle muscle weakness?: No Do you have pain in calf while walking?: No Are the shoes appropriate in style and fit?: Yes Can the patient see the bottom of their feet?: Yes Pulse Foot Exam completed.: Yes   Right Posterior Tibialis: Present Left posterior Tibialis: Present   Right Dorsalis Pedis: Present Left Dorsalis Pedis: Present     Sensory Foot Exam Completed.: Yes Semmes-Weinstein Monofilament Test "+" means "has sensation" and "-" means "no sensation"  R Foot Test Control: Pos L Foot Test Control: Pos   R Site 1-Great Toe: Pos L Site 1-Great Toe: Pos   R Site 4: Pos L Site 4: Pos   R site 5: Pos L Site 5: Pos  R Site 6: Pos L Site 6: Pos     Image components are not supported.   Image components are not supported. Image components are not supported.  [place] Comments        Follow-up: Return in about 6 months (around 09/18/2023) for f/u diabetes.   Mort Sawyers, FNP

## 2023-03-21 NOTE — Assessment & Plan Note (Signed)

## 2023-03-21 NOTE — Assessment & Plan Note (Signed)
Pt advised to work on diet and exercise as tolerated

## 2023-03-21 NOTE — Assessment & Plan Note (Signed)
Calcium and vitamin d , continue with daily.

## 2023-03-22 ENCOUNTER — Telehealth: Payer: Self-pay | Admitting: Internal Medicine

## 2023-03-22 ENCOUNTER — Encounter: Payer: Self-pay | Admitting: Family

## 2023-03-22 ENCOUNTER — Other Ambulatory Visit: Payer: Self-pay | Admitting: Family

## 2023-03-22 DIAGNOSIS — E1169 Type 2 diabetes mellitus with other specified complication: Secondary | ICD-10-CM

## 2023-03-22 DIAGNOSIS — E669 Obesity, unspecified: Secondary | ICD-10-CM

## 2023-03-22 DIAGNOSIS — R809 Proteinuria, unspecified: Secondary | ICD-10-CM

## 2023-03-22 DIAGNOSIS — E782 Mixed hyperlipidemia: Secondary | ICD-10-CM

## 2023-03-22 LAB — MICROALBUMIN / CREATININE URINE RATIO
Creatinine,U: 153.1 mg/dL
Microalb Creat Ratio: 235.2 mg/g — ABNORMAL HIGH (ref 0.0–30.0)
Microalb, Ur: 36 mg/dL — ABNORMAL HIGH (ref 0.0–1.9)

## 2023-03-22 MED ORDER — NITROGLYCERIN 0.4 MG SL SUBL: 0.4000 mg | SUBLINGUAL_TABLET | SUBLINGUAL | 3 refills | Status: DC | PRN

## 2023-03-22 NOTE — Telephone Encounter (Signed)
*  STAT* If patient is at the pharmacy, call can be transferred to refill team.   1. Which medications need to be refilled? (please list name of each medication and dose if known) nitroGLYCERIN (NITROSTAT) 0.4 MG SL tablet     4. Which pharmacy/location (including street and city if local pharmacy) is medication to be sent to? [id number] - [place], [place] - [address]     5. Do they need a 30 day or 90 day supply? 90

## 2023-03-22 NOTE — Progress Notes (Signed)
On losartan 100 mg.

## 2023-03-25 MED ORDER — OZEMPIC (0.25 OR 0.5 MG/DOSE) 2 MG/3ML ~~LOC~~ SOPN: 0.2500 mg | PEN_INJECTOR | SUBCUTANEOUS | 0 refills | Status: DC

## 2023-03-26 ENCOUNTER — Other Ambulatory Visit: Payer: Self-pay | Admitting: Family

## 2023-03-26 ENCOUNTER — Other Ambulatory Visit: Payer: Self-pay | Admitting: *Deleted

## 2023-03-26 DIAGNOSIS — R809 Proteinuria, unspecified: Secondary | ICD-10-CM

## 2023-03-26 NOTE — Addendum Note (Signed)
Addended by: Mort Sawyers on: 03/26/2023 09:12 AM   Modules accepted: Orders

## 2023-03-27 ENCOUNTER — Ambulatory Visit (INDEPENDENT_AMBULATORY_CARE_PROVIDER_SITE_OTHER): Payer: Medicare Other

## 2023-03-27 ENCOUNTER — Other Ambulatory Visit: Payer: Self-pay | Admitting: Radiology

## 2023-03-27 ENCOUNTER — Other Ambulatory Visit: Payer: Self-pay | Admitting: *Deleted

## 2023-03-27 ENCOUNTER — Telehealth: Payer: Self-pay

## 2023-03-27 DIAGNOSIS — E538 Deficiency of other specified B group vitamins: Secondary | ICD-10-CM | POA: Diagnosis not present

## 2023-03-27 DIAGNOSIS — R809 Proteinuria, unspecified: Secondary | ICD-10-CM

## 2023-03-27 MED ORDER — ONETOUCH VERIO VI STRP: ORAL_STRIP | 5 refills | Status: DC

## 2023-03-27 MED ORDER — CYANOCOBALAMIN 1000 MCG/ML IJ SOLN
1000.0000 ug | Freq: Once | INTRAMUSCULAR | Status: AC
Start: 2023-03-27 — End: 2023-03-27
  Administered 2023-03-27: 1000 ug via INTRAMUSCULAR

## 2023-03-27 NOTE — Progress Notes (Signed)
Per orders of Mort Sawyers, NP, injection of vitamin b 12 given by Lewanda Rife in right deltoid. Patient tolerated injection well. Patient will make appointment for 1 month.

## 2023-03-27 NOTE — Telephone Encounter (Signed)
Pt has picked up ozempic  from pharmacy and pt has reviewed info on pt giving herself the ozempic injection. Pt is a retired Charity fundraiser and wants to know if [employer] FNP wants pt to have NV to be instructed on how to give ozempic to herself. Pt said that she has reviewed the information on how to correctly administer the ozempic and pt feels comfortable in giving herself the ozempic shot. Hayden Pedro FNP said if pt is confident and comfortable in giving herself the ozempic injection without further training; T Dugal FNP does not think a NV training session is necessary. Pt voiced understanding and appreciative. If pt has any questions she will call [facility] back. Sending FYI to Hayden Pedro FNP.

## 2023-03-27 NOTE — Telephone Encounter (Signed)
Yes completely ok with this.

## 2023-03-30 ENCOUNTER — Other Ambulatory Visit: Payer: Self-pay | Admitting: Internal Medicine

## 2023-03-30 LAB — PROTEIN,TOTAL AND ELECT AND IFE,24
Albumin: 34 %
Alpha-1-Globulin, U: 8 %
Alpha-2-Globulin, U: 27 %
Beta Globulin, U: 13 %
Creatinine, 24H Ur: 1.17 g/(24.h) (ref 0.50–2.15)
Gamma Globulin, U: 19 %
PROTEIN/CREATININE RATIO: 0.519 mg/mg{creat} — ABNORMAL HIGH (ref <0.150)
PROTEIN/CREATININE RATIO: 519 mg/g{creat} — ABNORMAL HIGH (ref <150)
Protein, 24H Urine: 609 mg/(24.h) — ABNORMAL HIGH (ref 0–149)

## 2023-04-02 ENCOUNTER — Encounter: Payer: Self-pay | Admitting: Family

## 2023-04-02 ENCOUNTER — Other Ambulatory Visit: Payer: Self-pay | Admitting: Family

## 2023-04-02 DIAGNOSIS — R801 Persistent proteinuria, unspecified: Secondary | ICD-10-CM

## 2023-04-11 ENCOUNTER — Other Ambulatory Visit: Payer: Self-pay | Admitting: Internal Medicine

## 2023-04-23 ENCOUNTER — Other Ambulatory Visit: Payer: Self-pay | Admitting: Family

## 2023-04-23 ENCOUNTER — Encounter: Payer: Self-pay | Admitting: Family

## 2023-04-23 DIAGNOSIS — I10 Essential (primary) hypertension: Secondary | ICD-10-CM

## 2023-04-26 ENCOUNTER — Ambulatory Visit (INDEPENDENT_AMBULATORY_CARE_PROVIDER_SITE_OTHER): Payer: Medicare Other | Admitting: *Deleted

## 2023-04-26 DIAGNOSIS — E538 Deficiency of other specified B group vitamins: Secondary | ICD-10-CM

## 2023-04-26 MED ORDER — CYANOCOBALAMIN 1000 MCG/ML IJ SOLN
1000.0000 ug | Freq: Once | INTRAMUSCULAR | Status: AC
  Administered 2023-04-26: 1000 ug via INTRAMUSCULAR

## 2023-04-26 NOTE — Progress Notes (Signed)
 Per orders of Dr. Kerby Nora (PCP out of the office), injection of B-12 given by Blenda Mounts M in left deltoid. Patient tolerated injection well. Patient will make appointment for 1 month.

## 2023-05-02 ENCOUNTER — Encounter: Payer: Self-pay | Admitting: Family

## 2023-05-02 ENCOUNTER — Other Ambulatory Visit: Payer: Self-pay | Admitting: Family

## 2023-05-02 DIAGNOSIS — L0291 Cutaneous abscess, unspecified: Secondary | ICD-10-CM

## 2023-05-02 DIAGNOSIS — E1169 Type 2 diabetes mellitus with other specified complication: Secondary | ICD-10-CM

## 2023-05-02 DIAGNOSIS — L03221 Cellulitis of neck: Secondary | ICD-10-CM

## 2023-05-07 NOTE — Progress Notes (Unsigned)
 Cardiology Office Note:  .   Date:  05/10/2023  ID:  IYSIS GERMAIN, DOB 1950-12-01, MRN [id number] PCP: Mort Sawyers, FNP  [facility] Providers Cardiologist:  Christell Constant, MD    Patient Profile: .      PMH CAD S/p mLAD bifurcation PCI (2.25 x 18 mm) 01/2014 LHC 11/19/2019 Widely patent LAD stent Nonobstructive CAD  Angina class IV Hypertension Hyperlipidemia NAFLD Type 2 diabetes   Previous patient of Dr. Katrinka Blazing with history of PCI in 2015.  She established with Dr. Raynelle Jan 01/06/2022.  Unfortunately her husband had [place] passed away [place].  She had stopped going to the gym, working through her grief.  Her anginal equivalent was sudden onset nausea, chest discomfort at rest while driving and dry heaving.   Last cardiology clinic visit was 01/16/2023 with Dr. Raynelle Jan.  She had new onset palpitations occurring mainly in the evening and sometimes waking her from sleep.  This was occurring almost daily for the past month.  Describes a sensation of her heart "flip-flopping" and sometimes feeling rapid.  These were occurring at rest and resolving spontaneously.  She did not have any accompanying chest pain, dyspnea, or syncope.  She felt that chest pain had been well-controlled on Imdur.  14-day cardiac monitor revealed rare symptomatic PVCs.  She was advised to increase metoprolol to tartrate 25 mg twice daily.       History of Present Illness: .   Kaisyn A Sikkema is a very pleasant 73 y.o. female who is here today for follow-up of palpitations.    Discussed the use of AI scribe software for clinical note transcription with the patient, who gave verbal consent to proceed.   ROS: See HPI                                                                                                                                                                                                                             Studies Reviewed: [name]        No  results found for: "LIPOA"   Risk Assessment/Calculations:             Physical Exam:   VS:  BP 122/78   Pulse 82   Resp 16   Ht 5\' 3"  (1.6 m)   Wt 169 lb 9.6 oz (76.9 kg)   LMP 02/06/2010 Comment: bleeding 12-14-2018  SpO2 98%   BMI 30.04 kg/m  Wt Readings from Last 3 Encounters:  05/10/23 169 lb 9.6 oz (76.9 kg)  03/21/23 169 lb 12.8 oz (77 kg)  01/16/23 168 lb (76.2 kg)    GEN: Well nourished, well developed in no acute distress NECK: No JVD; No carotid bruits CARDIAC: RRR, no murmurs, rubs, gallops RESPIRATORY:  Clear to auscultation without rales, wheezing or rhonchi  ABDOMEN: Soft, non-tender, non-distended EXTREMITIES:  No edema; No deformity     ASSESSMENT AND PLAN: .    CAD without angina: History of PCI/DES to mLAD in 2015. Cath 11/2019 revealed widely patent stent, no obstructive CAD. She denies chest pain, dyspnea, or other symptoms concerning for angina.  No indication for further ischemic evaluation at this time.  Lipids are well-controlled. No bleeding concerns. Continue aspirin, rosuvastatin, Imdur, metoprolol, losartan, ezetimibe. Focus on secondary prevention including heart healthy mostly plant based diet avoiding saturated fat, processed foods, simple carbohydrates, and sugar along with aiming for at least 150 minutes of moderate intensity exercise each week.   PVCs/Palpitations: Symptoms are better. She is taking metprolol 12.5 mg at night and 25 mg in the morning and feels this is controlling palpitations well.   Diabetes: We discussed lifestyle modifications for management of heart disease, diabetes, HTN, and HLD.  She is planning to see a nutritionist for additional assistance. Encouraged mostly whole food diet avoiding saturated fat, processed foods, and simple carbohydrates along with regular physical activity.   Hyperlipidemia LDL goal < 70: Lipid panel completed 03/21/2023 with total cholesterol 122, HDL 55, LDL 44, and triglycerides 829.  She is at  goal.  Continue rosuvastatin and ezetimibe along with heart healthy diet and regular physical activity.  Hypertension: BP initially elevated but improved on my recheck. She has been referred to nephrology by PCP for proteinuria. Encouraged home monitoring for goal < 130/80. Report consistently elevated BP to PCP or to [place]. Consider switching from metoprolol to carvedilol if BP remains elevated.        Disposition:6 months with Dr. Izora Ribas  Signed, Eligha Bridegroom, NP-C

## 2023-05-10 ENCOUNTER — Ambulatory Visit: Payer: Medicare Other | Attending: Nurse Practitioner | Admitting: Nurse Practitioner

## 2023-05-10 ENCOUNTER — Encounter: Payer: Self-pay | Admitting: Nurse Practitioner

## 2023-05-10 VITALS — BP 122/78 | HR 82 | Resp 16 | Ht 63.0 in | Wt 169.6 lb

## 2023-05-10 DIAGNOSIS — I2510 Atherosclerotic heart disease of native coronary artery without angina pectoris: Secondary | ICD-10-CM | POA: Diagnosis not present

## 2023-05-10 DIAGNOSIS — E1169 Type 2 diabetes mellitus with other specified complication: Secondary | ICD-10-CM

## 2023-05-10 DIAGNOSIS — R002 Palpitations: Secondary | ICD-10-CM | POA: Diagnosis not present

## 2023-05-10 DIAGNOSIS — I10 Essential (primary) hypertension: Secondary | ICD-10-CM | POA: Diagnosis not present

## 2023-05-10 DIAGNOSIS — E785 Hyperlipidemia, unspecified: Secondary | ICD-10-CM | POA: Diagnosis not present

## 2023-05-10 DIAGNOSIS — I493 Ventricular premature depolarization: Secondary | ICD-10-CM

## 2023-05-10 NOTE — Patient Instructions (Addendum)
 Medication Instructions:   Your physician recommends that you continue on your current medications as directed. Please refer to the Current Medication list given to you today.   *If you need a refill on your cardiac medications before your next appointment, please call your pharmacy*  Lab Work:  None ordered.  If you have labs (blood work) drawn today and your tests are completely normal, you will receive your results only by: MyChart Message (if you have MyChart) OR A paper copy in the mail If you have any lab test that is abnormal or we need to change your treatment, we will call you to review the results.  Testing/Procedures:  None ordered.  Follow-Up: At [facility], you and your health needs are our priority.  As part of our continuing mission to provide you with exceptional heart care, our providers are all part of one team.  This team includes your primary Cardiologist (physician) and Advanced Practice Providers or APPs (Physician Assistants and Nurse Practitioners) who all work together to provide you with the care you need, when you need it.  Your next appointment:   6 month(s)  Provider:   Christell Constant, MD     We recommend signing up for the patient portal called "MyChart".  Sign up information is provided on this After Visit Summary.  MyChart is used to connect with patients for Virtual Visits (Telemedicine).  Patients are able to view lab/test results, encounter notes, upcoming appointments, etc.  Non-urgent messages can be sent to your provider as well.   To learn more about what you can do with MyChart, go to [email or web address].   Other Instructions  Your physician wants you to follow-up in: 6 months.  You will receive a reminder letter in the mail two months in advance. If you don't receive a letter, please call our office to schedule the follow-up appointment.       1st Floor: - Lobby - Registration  - Pharmacy  - Lab - Cafe  2nd  Floor: - PV Lab - Diagnostic Testing (echo, CT, nuclear med)  3rd Floor: - Vacant  4th Floor: - TCTS (cardiothoracic surgery) - AFib Clinic - Structural Heart Clinic - Vascular Surgery  - Vascular Ultrasound  5th Floor: - HeartCare Cardiology (general and EP) - Clinical Pharmacy for coumadin, hypertension, lipid, weight-loss medications, and med management appointments    Valet parking services will be available as well.     HOW TO TAKE YOUR BLOOD PRESSURE  Rest 5 minutes before taking your blood pressure. Don't  smoke or drink caffeinated beverages for at least 30 minutes before. Take your blood pressure before (not after) you eat. Sit comfortably with your back supported and both feet on the floor ( don't cross your legs). Elevate your arm to heart level on a table or a desk. Use the proper sized cuff.  It should fit smoothly and snugly around your bare upper arm.  There should be  Enough room to slip a fingertip under the cuff.  The bottom edge of the cuff should be 1 inch above the crease Of the elbow. Please monitor your blood pressure once daily 2 hours after your am medication. If you blood pressure Consistently remains above 130 (systolic) top number or over 80 ( diastolic) bottom number X 3 days  Consecutively.  Please call our office at [phone] or send Mychart message.     ----Avoid cold medicines with D or DM at the end of them----

## 2023-05-16 ENCOUNTER — Encounter: Payer: Self-pay | Admitting: Dietician

## 2023-05-16 ENCOUNTER — Encounter: Payer: Medicare Other | Attending: Family | Admitting: Dietician

## 2023-05-16 VITALS — Ht 63.0 in | Wt 163.4 lb

## 2023-05-16 DIAGNOSIS — E1169 Type 2 diabetes mellitus with other specified complication: Secondary | ICD-10-CM

## 2023-05-16 DIAGNOSIS — Z713 Dietary counseling and surveillance: Secondary | ICD-10-CM | POA: Diagnosis not present

## 2023-05-16 DIAGNOSIS — E119 Type 2 diabetes mellitus without complications: Secondary | ICD-10-CM | POA: Diagnosis present

## 2023-05-16 DIAGNOSIS — E669 Obesity, unspecified: Secondary | ICD-10-CM

## 2023-05-16 DIAGNOSIS — E782 Mixed hyperlipidemia: Secondary | ICD-10-CM

## 2023-05-16 NOTE — Progress Notes (Signed)
 Diabetes Self-Management Education  Visit Type: First/Initial  Appt. Start Time: 0845 Appt. End Time: 1015  05/16/2023  Veronica [name], identified by name and date of birth, is a 73 y.o. female with a diagnosis of Diabetes: Type 2.   ASSESSMENT  Height 5\' 3"  (1.6 m), weight 163 lb 6.4 oz (74.1 kg), last menstrual period 02/06/2010. Body mass index is 28.95 kg/m.   Diabetes Self-Management Education - 05/16/23 0855       Visit Information   Visit Type First/Initial      Initial Visit   Diabetes Type Type 2    Date Diagnosed 2010    Are you currently following a meal plan? Yes    What type of meal plan do you follow? low carb, low protein    Are you taking your medications as prescribed? Yes      Health Coping   How would you rate your overall health? Fair      Psychosocial Assessment   Patient Belief/Attitude about Diabetes Motivated to manage diabetes    Self-care barriers None    Special Needs None    Learning Readiness Change in progress    How often do you need to have someone help you when you read instructions, pamphlets, or other written materials from your doctor or pharmacy? 1 - Never    What is the last grade level you completed in school? community college      Pre-Education Assessment   Patient understands the diabetes disease and treatment process. Comprehends key points    Patient understands incorporating nutritional management into lifestyle. Needs Review    Patient undertands incorporating physical activity into lifestyle. Comprehends key points    Patient understands using medications safely. Comprehends key points    Patient understands monitoring blood glucose, interpreting and using results Comprehends key points    Patient understands prevention, detection, and treatment of acute complications. Needs Review    Patient understands prevention, detection, and treatment of chronic complications. Needs Review    Patient understands how to develop  strategies to address psychosocial issues. Needs Review    Patient understands how to develop strategies to promote health/change behavior. Comprehends key points      Complications   Last HgB A1C per patient/outside source 7.5 %    How often do you check your blood sugar? 1-2 times/day    Fasting Blood glucose range (mg/dL) 96-295   284-132 for past 2 weeks   Postprandial Blood glucose range (mg/dL) --   no post-prandial tests   Number of hypoglycemic episodes per month --   sometimes wakes up hungry during the night; often feels like sugar is declining before lunch; no hypoglycemia when testing before lunch   Have you had a dilated eye exam in the past 12 months? Yes    Have you had a dental exam in the past 12 months? Yes    Are you checking your feet? Yes    How many days per week are you checking your feet? 4      Dietary Intake   Breakfast egg salad sandwich (1 piece seeded toast)  with lettuce, asparagus, 1/4 of one orange/ berries; occ yogurt with granola and fruit    Lunch varies -- sandwich with 1 pc bread; leftovers; rotisserie chicken; veg plate    Snack (afternoon) 1/3c mixed nuts (rubs off salt); fruit ie berries;    Dinner chicken + veg green beans, asparagus, corn, tomato, okra (pan seared), broccoli,    Snack (evening)  yogurt/ string cheese (sometimes wakes up hungry during night, eats cheese)    Beverage(s) iced tea with artificial sweetener and lemon; maybe 1/2c coffee (doesn't taste good anymore); water      Activity / Exercise   Activity / Exercise Type Light (walking / raking leaves)    How many days per week do you exercise? 6    How many minutes per day do you exercise? 23    Total minutes per week of exercise 138      Patient Education   Disease Pathophysiology Explored patient's options for treatment of their diabetes    Healthy Eating Food label reading, portion sizes and measuring food.;Plate Method;Reviewed blood glucose goals for pre and post meals and how  to evaluate the patients' food intake on their blood glucose level.;Meal options for control of blood glucose level and chronic complications.;Other (comment)   protein controlled, low sodium for renal health; sources of vitamin B12   Being Active Role of exercise on diabetes management, blood pressure control and cardiac health.    Acute complications Taught prevention, symptoms, and  treatment of hypoglycemia - the 15 rule.      Individualized Goals (developed by patient)   Nutrition Other (comment);Follow meal plan discussed    Physical Activity Exercise 5-7 days per week;15 minutes per day;30 minutes per day      Post-Education Assessment   Patient understands the diabetes disease and treatment process. Comprehends key points    Patient understands incorporating nutritional management into lifestyle. Demonstrates understanding / competency    Patient undertands incorporating physical activity into lifestyle. Demonstrates understanding / competency    Patient understands using medications safely. Demonstrates understanding / competency    Patient understands monitoring blood glucose, interpreting and using results Demonstrates understanding / competency    Patient understands prevention, detection, and treatment of acute complications. Demonstrates understanding / competency    Patient understands prevention, detection, and treatment of chronic complications. Comprehends key points    Patient understands how to develop strategies to address psychosocial issues. Comprehends key points    Patient understands how to develop strategies to promote health/change behavior. Demonstrates understanding / competency      Outcomes   Expected Outcomes Demonstrated interest in learning. Expect positive outcomes    Future DMSE PRN    Program Status Completed             Individualized Plan for Diabetes Self-Management Training:   Learning Objective:  Patient will have a greater understanding of  diabetes self-management. Patient education plan is to attend individual and/or group sessions per assessed needs and concerns.   Plan:   Patient Instructions  Aim for about 64oz of fluids daily, all fluids count towards this goal. Sugar free flavored water, Sparkling Ice are options if plain water doesn't work.  Portions of protein foods about palm-size or less each meal meets needs without going over.  Goal for sodium is about 2000mg  daily or less, averaging about 600mg  each meal (or less).  Allow 30-45grams of carbs (mostly healthy, whole grain choices) with each meal (2-3 "servings").  Great job making healthy and appropriate choices, keep it up!  Expected Outcomes:  Demonstrated interest in learning. Expect positive outcomes  Education material provided: plate planner with food lists; Following a Low Sodium Diet  If problems or questions, patient to contact team via:  Phone and patient portal  Future DSME appointment: PRN

## 2023-05-16 NOTE — Patient Instructions (Addendum)
 Aim for about 64oz of fluids daily, all fluids count towards this goal. Sugar free flavored water, Sparkling Ice are options if plain water doesn't work.  Portions of protein foods about palm-size or less each meal meets needs without going over.  Goal for sodium is about 2000mg  daily or less, averaging about 600mg  each meal (or less).  Allow 30-45grams of carbs (mostly healthy, whole grain choices) with each meal (2-3 "servings").  Great job making healthy and appropriate choices, keep it up!

## 2023-05-29 ENCOUNTER — Ambulatory Visit (INDEPENDENT_AMBULATORY_CARE_PROVIDER_SITE_OTHER)

## 2023-05-29 ENCOUNTER — Telehealth: Payer: Self-pay

## 2023-05-29 DIAGNOSIS — E538 Deficiency of other specified B group vitamins: Secondary | ICD-10-CM

## 2023-05-29 DIAGNOSIS — R809 Proteinuria, unspecified: Secondary | ICD-10-CM

## 2023-05-29 MED ORDER — CYANOCOBALAMIN 1000 MCG/ML IJ SOLN
1000.0000 ug | Freq: Once | INTRAMUSCULAR | Status: AC
  Administered 2023-05-29: 1000 ug via INTRAMUSCULAR

## 2023-05-29 NOTE — Telephone Encounter (Signed)
 Pt was in for her last B12 injection. She was asking about the nephrology referral stating she has not heard from them from the referral  that was sent to them on 04-13-23 by Ashtyn. Pt states she has called the office we referred her to. They told her they have done the calculations for her renal function and feel she is not urgent and will get back with her soon. It has been almost a month and a half. Pt wants to make sure it is ok for her to wait longer. Also, she needs to know when she is supposed to come back in for f/u on Diabetes and kidneys.

## 2023-05-29 NOTE — Progress Notes (Signed)
 Per orders of Felicita Horns, NP injection of #3 of 3 monthly B12 1000 mcg/ml given by Franne Ivory, CMA in Right Deltoid. Patient tolerated injection well.  Sending to Dr Malissa Se in DeLisle Dugal's absence.

## 2023-05-30 ENCOUNTER — Other Ambulatory Visit: Payer: Self-pay | Admitting: Family

## 2023-05-30 NOTE — Telephone Encounter (Signed)
 Lets order kidney ultrasound.  This has been unfortunately a typical weight.   We will continue to monitor the kidney function as we go.  Pt due for 6 month f/u from February so will be due again in August.   I have sent in your order electronically for the following: kidney ultrasound at this location below. Please call to schedule the appointment at your convenience  [facility] [address], [place] [address] Phone [phone]  8-5 pm

## 2023-05-30 NOTE — Telephone Encounter (Signed)
 Pt inquired about next appt due.  Last office visit states the follow up visit is to be in six months.  FYI Ashley this is the one we had discussed.

## 2023-05-30 NOTE — Addendum Note (Signed)
 Addended by: Felicita Horns on: 05/30/2023 12:51 PM   Modules accepted: Orders

## 2023-05-31 ENCOUNTER — Other Ambulatory Visit: Payer: Self-pay | Admitting: Family

## 2023-06-06 ENCOUNTER — Other Ambulatory Visit: Payer: Self-pay | Admitting: Family

## 2023-06-06 DIAGNOSIS — E1169 Type 2 diabetes mellitus with other specified complication: Secondary | ICD-10-CM

## 2023-06-12 ENCOUNTER — Ambulatory Visit
Admission: RE | Admit: 2023-06-12 | Discharge: 2023-06-12 | Disposition: A | Discharge status: Home / Self Care | Source: Ambulatory Visit | Attending: Family | Admitting: Family

## 2023-06-12 DIAGNOSIS — R809 Proteinuria, unspecified: Secondary | ICD-10-CM | POA: Diagnosis present

## 2023-06-15 ENCOUNTER — Encounter: Payer: Self-pay | Admitting: Family

## 2023-06-22 LAB — HM MAMMOGRAPHY

## 2023-06-27 ENCOUNTER — Ambulatory Visit: Payer: Self-pay | Admitting: Family

## 2023-07-26 ENCOUNTER — Other Ambulatory Visit: Payer: Self-pay | Admitting: Family

## 2023-08-16 ENCOUNTER — Telehealth: Payer: Self-pay

## 2023-08-16 NOTE — Telephone Encounter (Signed)
 Awaiting incoming fax with request

## 2023-08-16 NOTE — Telephone Encounter (Signed)
 Copied from CRM [id number]. Topic: General - Other >> Aug 16, 2023  2:07 PM Donna BRAVO wrote: Reason for CRM: Marlyn a health coach with [facility] [phone] wanting to verify A1c number and date of test  and the next appt date. Next appt is scheduled for 09/19/23  Marlyn will fax over a release for the A1c number.

## 2023-08-20 NOTE — Telephone Encounter (Signed)
 Have not received fax as of 7/14

## 2023-08-27 NOTE — Telephone Encounter (Signed)
 Did not receive fax. Will await further communication from [facility]

## 2023-09-03 ENCOUNTER — Encounter: Payer: Self-pay | Admitting: Family

## 2023-09-03 ENCOUNTER — Ambulatory Visit: Payer: Self-pay | Admitting: Family

## 2023-09-03 ENCOUNTER — Ambulatory Visit
Admission: RE | Admit: 2023-09-03 | Discharge: 2023-09-03 | Disposition: A | Discharge status: Home / Self Care | Source: Ambulatory Visit | Attending: Family | Admitting: Family

## 2023-09-03 ENCOUNTER — Ambulatory Visit (INDEPENDENT_AMBULATORY_CARE_PROVIDER_SITE_OTHER): Admitting: Family

## 2023-09-03 VITALS — BP 112/76 | HR 84 | Temp 98.1°F | Ht 63.0 in | Wt 155.8 lb

## 2023-09-03 DIAGNOSIS — R1084 Generalized abdominal pain: Secondary | ICD-10-CM | POA: Insufficient documentation

## 2023-09-03 DIAGNOSIS — N3000 Acute cystitis without hematuria: Secondary | ICD-10-CM

## 2023-09-03 DIAGNOSIS — R110 Nausea: Secondary | ICD-10-CM

## 2023-09-03 DIAGNOSIS — R5081 Fever presenting with conditions classified elsewhere: Secondary | ICD-10-CM

## 2023-09-03 DIAGNOSIS — E1169 Type 2 diabetes mellitus with other specified complication: Secondary | ICD-10-CM

## 2023-09-03 DIAGNOSIS — R197 Diarrhea, unspecified: Secondary | ICD-10-CM | POA: Diagnosis not present

## 2023-09-03 DIAGNOSIS — R1032 Left lower quadrant pain: Secondary | ICD-10-CM

## 2023-09-03 DIAGNOSIS — R1012 Left upper quadrant pain: Secondary | ICD-10-CM

## 2023-09-03 LAB — AMYLASE: Amylase: 40 U/L (ref 27–131)

## 2023-09-03 LAB — HEMOGLOBIN A1C: Hgb A1c MFr Bld: 6.6 % — ABNORMAL HIGH (ref 4.6–6.5)

## 2023-09-03 LAB — CBC WITH DIFFERENTIAL/PLATELET
Basophils Absolute: 0 K/uL (ref 0.0–0.1)
Basophils Relative: 0.3 % (ref 0.0–3.0)
Eosinophils Absolute: 0.9 K/uL — ABNORMAL HIGH (ref 0.0–0.7)
Eosinophils Relative: 12.8 % — ABNORMAL HIGH (ref 0.0–5.0)
HCT: 42.2 % (ref 36.0–46.0)
Hemoglobin: 14.2 g/dL (ref 12.0–15.0)
Lymphocytes Relative: 20.8 % (ref 12.0–46.0)
Lymphs Abs: 1.4 K/uL (ref 0.7–4.0)
MCHC: 33.7 g/dL (ref 30.0–36.0)
MCV: 89.4 fl (ref 78.0–100.0)
Monocytes Absolute: 0.4 K/uL (ref 0.1–1.0)
Monocytes Relative: 5.7 % (ref 3.0–12.0)
Neutro Abs: 4.1 K/uL (ref 1.4–7.7)
Neutrophils Relative %: 60.4 % (ref 43.0–77.0)
Platelets: 207 K/uL (ref 150.0–400.0)
RBC: 4.72 Mil/uL (ref 3.87–5.11)
RDW: 13.8 % (ref 11.5–15.5)
WBC: 6.7 K/uL (ref 4.0–10.5)

## 2023-09-03 LAB — URINALYSIS, ROUTINE W REFLEX MICROSCOPIC
Bilirubin Urine: NEGATIVE
Leukocytes,Ua: NEGATIVE
Nitrite: NEGATIVE
Specific Gravity, Urine: 1.025 (ref 1.000–1.030)
Total Protein, Urine: 100 — AB
Urine Glucose: >=1000 — AB
Urobilinogen, UA: 0.2 (ref 0.0–1.0)
pH: 5.5 (ref 5.0–8.0)

## 2023-09-03 LAB — COMPREHENSIVE METABOLIC PANEL WITH GFR
ALT: 25 U/L (ref 0–35)
AST: 25 U/L (ref 0–37)
Albumin: 4.5 g/dL (ref 3.5–5.2)
Alkaline Phosphatase: 74 U/L (ref 39–117)
BUN: 19 mg/dL (ref 6–23)
CO2: 25 meq/L (ref 19–32)
Calcium: 10.2 mg/dL (ref 8.4–10.5)
Chloride: 107 meq/L (ref 96–112)
Creatinine, Ser: 0.95 mg/dL (ref 0.40–1.20)
GFR: 60.34 mL/min (ref >60.00)
Glucose, Bld: 98 mg/dL (ref 70–99)
Potassium: 4.2 meq/L (ref 3.5–5.1)
Sodium: 141 meq/L (ref 135–145)
Total Bilirubin: 0.7 mg/dL (ref 0.2–1.2)
Total Protein: 6.9 g/dL (ref 6.0–8.3)

## 2023-09-03 LAB — LIPASE: Lipase: 19 U/L (ref 11.0–59.0)

## 2023-09-03 LAB — POCT I-STAT CREATININE: Creatinine, Ser: 1 mg/dL (ref 0.44–1.00)

## 2023-09-03 MED ORDER — ONDANSETRON HCL 4 MG PO TABS: 4.0000 mg | ORAL_TABLET | Freq: Three times a day (TID) | ORAL | 0 refills | Status: DC | PRN

## 2023-09-03 MED ORDER — AMOXICILLIN-POT CLAVULANATE 875-125 MG PO TABS: 1.0000 | ORAL_TABLET | Freq: Two times a day (BID) | ORAL | 0 refills | Status: DC

## 2023-09-03 MED ORDER — IOHEXOL 300 MG/ML  SOLN
85.0000 mL | Freq: Once | INTRAMUSCULAR | Status: AC | PRN
  Administered 2023-09-03: 85 mL via INTRAVENOUS

## 2023-09-03 NOTE — Assessment & Plan Note (Signed)
 Worse luq and llq  Mild in rlq R/o diverticulitis, pancreatitis, colitis.  Low suspicion appendicitis.  Stat cbc cmp amylase lipase  U/a and culture ordered pending results.   Stat ct abd pelvis

## 2023-09-03 NOTE — Addendum Note (Signed)
 Addended by: LORELLE ROCKY BRAVO on: 09/03/2023 09:34 AM   Modules accepted: Orders

## 2023-09-03 NOTE — Progress Notes (Signed)
 Established Patient Office Visit  Subjective:   Patient ID: Veronica [name], female    DOB: [birth year]  Age: 73 y.o. MRN: [id number]  CC:  Chief Complaint  Patient presents with   Acute Visit    GI upset x2 weeks. Reports increased gas, loose stool, foul tasting burps.    HPI: Veronica [name] is a 73 y.o. female presenting on 09/03/2023 for Acute Visit (GI upset x2 weeks. Reports increased gas, loose stool, foul tasting burps.)  C/o symptoms that started two weeks ago.   DM2: started ozempic  in March and was able to tolerate, but now at 0.5. She states has noticed now after taking her ozempic  vaccination that she is having sulfur tasting burps, increased gas and loose stool. She states yesterday she felt so nauseous she thought she might throw up in her purse, she had a low grade fever yesterday and started sweating. She states she does notice some low glucose levels around 88.   Fever was around 100 F.  Did not take her ozempic  last week due to s/e and she states on and off will feel better.   Last night had a semi formed stool but during the night was up three to four times with loose stools and bad gas. Some slight abdominal pain left upper quadrant. The pain comes and goes, alternates from dull aching and sharp stabbing at times. Last night up three times with 'big blowouts' with her loose stool. This am first thing she had a lot of gas and small amount of stool. She does note a decreased appetite only eating chicken broth.    No urinary symptoms.     Wt Readings from Last 3 Encounters:  09/03/23 155 lb 12.8 oz (70.7 kg)  05/16/23 163 lb 6.4 oz (74.1 kg)  05/10/23 169 lb 9.6 oz (76.9 kg)           ROS: Negative unless specifically indicated above in HPI.   Relevant past medical history reviewed and updated as indicated.   Allergies and medications reviewed and updated.   Current Outpatient Medications:    aspirin  EC 81 MG EC tablet, Take 1 tablet (81 mg total) by  mouth daily. Swallow whole., Disp: 90 tablet, Rfl: 3   augmented betamethasone dipropionate (DIPROLENE-AF) 0.05 % cream, Apply 1 application topically daily as needed (for skin irritation). , Disp: , Rfl:    Cyanocobalamin  (VITAMIN B 12 PO), Take 1 tablet by mouth daily., Disp: , Rfl:    cyclobenzaprine  (FLEXERIL ) 10 MG tablet, Take 10 mg by mouth 3 (three) times daily as needed for muscle spasms. , Disp: , Rfl: 0   diphenhydrAMINE (BENADRYL) 25 mg capsule, Take 25 mg by mouth every 6 (six) hours as needed for sleep., Disp: , Rfl:    estradiol  (ESTRACE ) 0.1 MG/GM vaginal cream, PLACE 1 APPLICATORFUL VAGINALLY DAILY AS NEEDED., Disp: 42.5 g, Rfl: 5   ezetimibe  (ZETIA ) 10 MG tablet, TAKE 1 TABLET BY MOUTH EVERY DAY, Disp: 90 tablet, Rfl: 3   famotidine  (PEPCID ) 20 MG tablet, TAKE 1 TABLET BY MOUTH TWICE A DAY, Disp: 180 tablet, Rfl: 3   FARXIGA 5 MG TABS tablet, Take 5 mg by mouth daily., Disp: , Rfl:    fish oil-omega-3 fatty acids  1000 MG capsule, Take 1 g by mouth daily., Disp: , Rfl:    halobetasol (ULTRAVATE) 0.05 % cream, Apply 1 application topically daily as needed (for skin irritation). , Disp: , Rfl:    hydrocortisone  (ANUSOL -HC) 2.5 % rectal  cream, Place 1 application rectally 2 (two) times daily. As needed, Disp: 30 g, Rfl: 5   hydrocortisone  valerate cream (WESTCORT ) 0.2 %, Apply 1 application topically 2 (two) times daily as needed (rash). , Disp: , Rfl:    isosorbide  mononitrate (IMDUR ) 30 MG 24 hr tablet, TAKE 1 TABLET BY MOUTH EVERY DAY, Disp: 90 tablet, Rfl: 3   levothyroxine  (SYNTHROID ) 88 MCG tablet, TAKE 1 TABLET BY MOUTH EVERY DAY IN THE MORNING ON EMPTY STOMACH FOR 90 DAYS, Disp: 90 tablet, Rfl: 1   losartan  (COZAAR ) 100 MG tablet, TAKE 1 TABLET BY MOUTH EVERY DAY, Disp: 90 tablet, Rfl: 3   metFORMIN  (GLUCOPHAGE ) 1000 MG tablet, Take 1 tablet (1,000 mg total) by mouth 2 (two) times daily with a meal., Disp: 180 tablet, Rfl: 3   metoprolol  tartrate (LOPRESSOR ) 25 MG tablet, Take  1 tablet (25 mg total) by mouth 2 (two) times daily., Disp: 180 tablet, Rfl: 3   metroNIDAZOLE (METROCREAM) 0.75 % cream, SMARTSIG:1 Sparingly Topical Twice Daily, Disp: , Rfl:    Multiple Vitamins-Minerals (PRESERVISION AREDS PO), Take 1 capsule by mouth daily., Disp: , Rfl:    nitroGLYCERIN  (NITROSTAT ) 0.4 MG SL tablet, PLACE 1 TABLET UNDER THE TONGUE EVERY 5 MINUTES AS NEEDED FOR CHEST PAIN., Disp: 75 tablet, Rfl: 3   NONFORMULARY OR COMPOUNDED ITEM, [facility]:  Antifungal topical - Terbinafine 3%, Fluconazole 2%, Tea Tree Oil 5%, Urea 10%, Ibuprofen  2%, in #71ml DMSO suspension. Apply to affected toenail(s) once daily (at bedtime) or twice daily, Disp: 30 each, Rfl: 11   ondansetron  (ZOFRAN ) 4 MG tablet, Take 1 tablet (4 mg total) by mouth every 8 (eight) hours as needed for nausea or vomiting., Disp: 20 tablet, Rfl: 0   ONETOUCH VERIO test strip, Test blood sugar three times daily, Disp: 100 each, Rfl: 5   rosuvastatin  (CRESTOR ) 40 MG tablet, TAKE 1 TABLET BY MOUTH EVERY DAY, Disp: 90 tablet, Rfl: 3   triamcinolone  cream (KENALOG ) 0.1 %, APPLY TO AFFECTED AREA TWICE A DAY, Disp: 30 g, Rfl: 0  Allergies  Allergen Reactions   Ace Inhibitors Cough   Prilosec [Omeprazole Magnesium] Other (See Comments)    Blisters and skin peels off Mannie johnsons    Sulfa Antibiotics Other (See Comments)    Blisters and skin peels off Mannie johnsons   Tramadol Hcl    Ultram [Tramadol Hcl]     Lapse of memory     Ozempic  (0.25 Or 0.5 Mg-Dose) [Semaglutide (0.25 Or 0.5mg -Dos)] Other (See Comments)    Gi upset sulfur burps    Objective:   BP 112/76 (BP Location: Left Arm, Patient Position: Sitting, Cuff Size: Normal)   Pulse 84   Temp 98.1 F (36.7 C) (Temporal)   Ht 5' 3 (1.6 m)   Wt 155 lb 12.8 oz (70.7 kg)   LMP 02/06/2010 Comment: bleeding 12-14-2018  SpO2 98%   BMI 27.60 kg/m    Physical Exam Vitals reviewed.  Constitutional:      General: She is not in acute distress.     Appearance: Normal appearance. She is normal weight. She is not ill-appearing, toxic-appearing or diaphoretic.  HENT:     Head: Normocephalic.  Cardiovascular:     Rate and Rhythm: Normal rate.  Pulmonary:     Effort: Pulmonary effort is normal.  Abdominal:     General: Abdomen is flat. Bowel sounds are decreased.     Palpations: Abdomen is soft.     Tenderness: There is abdominal tenderness in the right  lower quadrant, epigastric area, left upper quadrant and left lower quadrant. There is no guarding or rebound. Negative signs include Murphy's sign and McBurney's sign.  Musculoskeletal:        General: Normal range of motion.  Neurological:     General: No focal deficit present.     Mental Status: She is alert and oriented to person, place, and time. Mental status is at baseline.  Psychiatric:        Mood and Affect: Mood normal.        Behavior: Behavior normal.        Thought Content: Thought content normal.        Judgment: Judgment normal.     Assessment & Plan:  Left lower quadrant abdominal pain -     CT ABDOMEN PELVIS W CONTRAST; Future -     CBC with Differential/Platelet -     Urinalysis, Routine w reflex microscopic -     Urine Culture  Left upper quadrant abdominal pain -     CBC with Differential/Platelet -     Comprehensive metabolic panel with GFR -     Amylase -     Lipase  Fever in other diseases -     CBC with Differential/Platelet  Diarrhea of presumed infectious origin -     Giardia antigen -     C. difficile GDH and Toxin A/B -     Gastrointestinal Pathogen Pnl RT, PCR  Nausea -     Ondansetron  HCl; Take 1 tablet (4 mg total) by mouth every 8 (eight) hours as needed for nausea or vomiting.  Dispense: 20 tablet; Refill: 0  Generalized abdominal pain Assessment & Plan: Worse luq and llq  Mild in rlq R/o diverticulitis, pancreatitis, colitis.  Low suspicion appendicitis.  Stat cbc cmp amylase lipase  U/a and culture ordered pending results.    Stat ct abd pelvis    Type 2 diabetes mellitus with other specified complication, without long-term current use of insulin (HCC) -     Hemoglobin A1c     Follow up plan: Return if symptoms worsen or fail to improve.  Ginger Patrick, FNP

## 2023-09-04 ENCOUNTER — Other Ambulatory Visit: Payer: Self-pay | Admitting: Radiology

## 2023-09-04 DIAGNOSIS — R197 Diarrhea, unspecified: Secondary | ICD-10-CM

## 2023-09-04 LAB — URINE CULTURE
MICRO NUMBER:: 16753453
Result:: NO GROWTH
SPECIMEN QUALITY:: ADEQUATE

## 2023-09-05 LAB — C. DIFFICILE GDH AND TOXIN A/B
GDH ANTIGEN: NOT DETECTED
MICRO NUMBER:: 16759453
SPECIMEN QUALITY:: ADEQUATE
TOXIN A AND B: NOT DETECTED

## 2023-09-06 LAB — GIARDIA ANTIGEN
MICRO NUMBER:: 16759444
RESULT:: NOT DETECTED
SPECIMEN QUALITY:: ADEQUATE

## 2023-09-06 LAB — GASTROINTESTINAL PATHOGEN PNL
CampyloBacter Group: NOT DETECTED
Norovirus GI/GII: NOT DETECTED
Rotavirus A: NOT DETECTED
Salmonella species: NOT DETECTED
Shiga Toxin 1: NOT DETECTED
Shiga Toxin 2: NOT DETECTED
Shigella Species: NOT DETECTED
Vibrio Group: NOT DETECTED
Yersinia enterocolitica: NOT DETECTED

## 2023-09-08 ENCOUNTER — Other Ambulatory Visit: Payer: Self-pay | Admitting: Family

## 2023-09-10 ENCOUNTER — Other Ambulatory Visit: Payer: Self-pay | Admitting: Family

## 2023-09-10 DIAGNOSIS — E1169 Type 2 diabetes mellitus with other specified complication: Secondary | ICD-10-CM

## 2023-09-10 MED ORDER — LANCET DEVICE MISC: 1.0000 | Freq: Three times a day (TID) | 0 refills | Status: AC

## 2023-09-10 MED ORDER — LANCETS MISC. MISC: 1.0000 | Freq: Three times a day (TID) | 5 refills | Status: AC

## 2023-09-10 MED ORDER — BLOOD GLUCOSE MONITORING SUPPL DEVI: 1.0000 | Freq: Three times a day (TID) | 0 refills | Status: DC

## 2023-09-19 ENCOUNTER — Ambulatory Visit: Admitting: Family

## 2023-09-22 ENCOUNTER — Other Ambulatory Visit: Payer: Self-pay | Admitting: Family

## 2023-09-24 ENCOUNTER — Other Ambulatory Visit: Payer: Self-pay | Admitting: Family

## 2023-09-27 ENCOUNTER — Other Ambulatory Visit: Payer: Self-pay | Admitting: Family

## 2023-09-27 DIAGNOSIS — E1169 Type 2 diabetes mellitus with other specified complication: Secondary | ICD-10-CM

## 2023-11-01 ENCOUNTER — Other Ambulatory Visit: Payer: Self-pay | Admitting: Family

## 2023-11-01 DIAGNOSIS — E1169 Type 2 diabetes mellitus with other specified complication: Secondary | ICD-10-CM

## 2023-11-02 ENCOUNTER — Other Ambulatory Visit: Payer: Self-pay

## 2023-11-02 ENCOUNTER — Other Ambulatory Visit: Payer: Self-pay | Admitting: Family

## 2023-11-02 MED ORDER — ISOSORBIDE MONONITRATE ER 30 MG PO TB24: 30.0000 mg | ORAL_TABLET | Freq: Every day | ORAL | 2 refills | Status: AC

## 2023-11-14 LAB — LAB REPORT - SCANNED: EGFR: 53

## 2023-11-20 NOTE — Progress Notes (Signed)
 noted

## 2023-11-28 ENCOUNTER — Other Ambulatory Visit: Payer: Self-pay | Admitting: Family

## 2023-11-28 DIAGNOSIS — E1169 Type 2 diabetes mellitus with other specified complication: Secondary | ICD-10-CM

## 2023-12-12 ENCOUNTER — Encounter: Payer: Self-pay | Admitting: Family

## 2023-12-12 DIAGNOSIS — R197 Diarrhea, unspecified: Secondary | ICD-10-CM

## 2023-12-13 MED ORDER — OZEMPIC (0.25 OR 0.5 MG/DOSE) 2 MG/3ML ~~LOC~~ SOPN: 0.5000 mg | PEN_INJECTOR | SUBCUTANEOUS | 0 refills | Status: DC

## 2023-12-13 NOTE — Addendum Note (Signed)
 Addended by: CORWIN ANTU on: 12/13/2023 11:30 AM   Modules accepted: Orders

## 2024-01-12 ENCOUNTER — Other Ambulatory Visit: Payer: Self-pay | Admitting: Family

## 2024-01-12 DIAGNOSIS — R197 Diarrhea, unspecified: Secondary | ICD-10-CM

## 2024-01-16 ENCOUNTER — Ambulatory Visit: Attending: Internal Medicine | Admitting: Internal Medicine

## 2024-01-16 ENCOUNTER — Encounter: Payer: Self-pay | Admitting: Internal Medicine

## 2024-01-16 VITALS — BP 123/82 | HR 74 | Wt 158.8 lb

## 2024-01-16 DIAGNOSIS — I10 Essential (primary) hypertension: Secondary | ICD-10-CM | POA: Diagnosis not present

## 2024-01-16 DIAGNOSIS — I493 Ventricular premature depolarization: Secondary | ICD-10-CM | POA: Insufficient documentation

## 2024-01-16 DIAGNOSIS — E782 Mixed hyperlipidemia: Secondary | ICD-10-CM | POA: Diagnosis not present

## 2024-01-16 DIAGNOSIS — K760 Fatty (change of) liver, not elsewhere classified: Secondary | ICD-10-CM | POA: Diagnosis not present

## 2024-01-16 DIAGNOSIS — I2510 Atherosclerotic heart disease of native coronary artery without angina pectoris: Secondary | ICD-10-CM

## 2024-01-16 NOTE — Patient Instructions (Signed)
 Medication Instructions:  Your physician recommends that you continue on your current medications as directed. Please refer to the Current Medication list given to you today.  *If you need a refill on your cardiac medications before your next appointment, please call your pharmacy*  Lab Work: NONE  If you have labs (blood work) drawn today and your tests are completely normal, you will receive your results only by: MyChart Message (if you have MyChart) OR A paper copy in the mail If you have any lab test that is abnormal or we need to change your treatment, we will call you to review the results.  Testing/Procedures: NONE  Follow-Up: At [facility], you and your health needs are our priority.  As part of our continuing mission to provide you with exceptional heart care, our providers are all part of one team.  This team includes your primary Cardiologist (physician) and Advanced Practice Providers or APPs (Physician Assistants and Nurse Practitioners) who all work together to provide you with the care you need, when you need it.  Your next appointment:   1 year(s)  Provider:   None or One of our Advanced Practice Providers (APPs): Morse Clause, PA-C  Lamarr Satterfield, NP Miriam Shams, NP  Olivia Pavy, PA-C Josefa Beauvais, NP  Leontine Salen, PA-C Orren Fabry, PA-C  Hao Meng, PA-C Ernest Dick, NP  Damien Braver, NP Jon Hails, PA-C  Waddell Donath, PA-C    Dayna Dunn, PA-C  Scott Weaver, PA-C Lum Louis, NP Katlyn West, NP Callie Goodrich, PA-C  Xika Zhao, NP Sheng Haley, PA-C    Kathleen Johnson, PA-C

## 2024-01-16 NOTE — Progress Notes (Signed)
 Cardiology Office Note:    Date:  01/16/2024   ID:  Veronica [name], DOB 08/06/1950, MRN [id number]  PCP:  Corwin Antu, FNP   [facility] Providers Cardiologist:  None     Referring MD: Corwin Antu, FNP   CC: New palpitations  History of Present Illness:    Veronica [name] is a 73 y.o. female with a hx of CAD with prior PCI, HTN, HLD. And NAFLD.  Previously saw Dr. Claudene.   2023: Established with me, no sx. 2024: New PVCs. 2025; Established   Veronica [name] with coronary artery disease who presents for a follow-up visit for secondary prevention.  She has a history of coronary artery disease and underwent bifurcation PCI with a patent stent in 2021. She experiences rare palpitations but has had no recent symptoms and is doing well. She is currently on metoprolol , taken in the morning and evening, with no issues reported.  Her hypertension has been managed with her current medication regimen, although her blood pressure has been higher than normal. She is also on Farxiga, which was started after she saw Dr. Melia for protein in her urine. No issues with urinary tract infections have been reported since starting Farxiga.  She engages in regular physical activity, attending water aerobics at [facility] several times a week for two hours at a time. Initially, she felt exhausted after these sessions, but her stamina has improved, and she now only requires a short nap afterward.  She has a history of type 2 diabetes, which is part of her ongoing management plan. No new symptoms related to her diabetes have been reported during this visit.  Discussed the use of AI scribe software for clinical note transcription with the patient, who gave verbal consent to proceed.     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   Anxiety    menopausal related, history of   CAD S/P mLAD Bifurcation PCI: Resolute DES 2.25 mm x 18 mm(2.75 mm), 2.0 mm Angiosculpt of  Diag  01/08/2014   Fredda 0,1,1 midLAD-Diag lesion - progressed from Moderate to Severe 90% LAD, 70-80% Diag from 6-12 2015 --> Class IV Angina --> DES PCI with Angiosculpt PCI of Diag  FFR of m-dRCA 50-70% = 0.9    Complication of anesthesia    extreme sore throat and hoarseness after aneathesia, also woke up screamimg after surgery once   Coronary artery disease involving native coronary artery with angina pectoris with documented spasm 06/2013   h/o moderate 3 Vessel CAD with mLAD-Diag bifurcation ([place] 0,1,1)   Diabetes mellitus without complication (HCC)    Essential hypertension    stress test 10 yrs ago.   pcp   dr franky little   GERD (gastroesophageal reflux disease)    Headache    Migraines childhood   History of migraine headaches    as a child   Hyperlipemia    Hyperthyroidism    had radioactive iodine tx in the 1980's   Hypothyroidism    Osteoarthritis    joints ache   Osteopenia     Past Surgical History:  Procedure Laterality Date   BACK SURGERY     BREAST CYST ASPIRATION Left 04/1999   done in dr's office   CARDIAC CATHETERIZATION  06/2013   CARDIAC CATHETERIZATION  01/08/2014   Procedure: INTRAVASCULAR PRESSURE WIRE/FFR STUDY;  Surgeon: Victory LELON Claudene DOUGLAS, MD;  Location: [facility] CATH LAB;  Service: Cardiovascular;;  distal RCA   CARDIAC  CATHETERIZATION  01/08/2014   Procedure: CORONARY BALLOON ANGIOPLASTY;  Surgeon: Victory LELON Claudene DOUGLAS, MD;  Location: [facility] CATH LAB;  Service: Cardiovascular;;  diag   COLONOSCOPY     CORONARY ANGIOPLASTY WITH STENT PLACEMENT  01/08/2014   1   DILATATION & CURETTAGE/HYSTEROSCOPY WITH MYOSURE N/A 03/24/2016   Procedure: DILATATION & CURETTAGE/HYSTEROSCOPY WITH MYOSURE;  Surgeon: Ronal GORMAN Pinal, MD;  Location: [facility] ORS;  Service: Gynecology;  Laterality: N/A;   DILATION AND CURETTAGE OF UTERUS     LAPAROSCOPIC CHOLECYSTECTOMY  05/1995   LEFT HEART CATH AND CORONARY ANGIOGRAPHY N/A 11/19/2019   Procedure: LEFT HEART CATH AND  CORONARY ANGIOGRAPHY;  Surgeon: Jordan, Peter M, MD;  Location: [facility] INVASIVE CV LAB;  Service: Cardiovascular;  Laterality: N/A;   LEFT HEART CATHETERIZATION WITH CORONARY ANGIOGRAM N/A 07/04/2013   Procedure: LEFT HEART CATHETERIZATION WITH CORONARY ANGIOGRAM;  Surgeon: Victory LELON Claudene DOUGLAS, MD;  Location: [facility] CATH LAB;  Service: Cardiovascular;  Laterality: N/A;   LEFT HEART CATHETERIZATION WITH CORONARY ANGIOGRAM N/A 01/08/2014   Procedure: LEFT HEART CATHETERIZATION WITH CORONARY ANGIOGRAM;  Surgeon: Victory LELON Claudene DOUGLAS, MD;  Location: [facility] CATH LAB;  Service: Cardiovascular;  Laterality: N/A;   MOUTH SURGERY     PERCUTANEOUS STENT INTERVENTION  01/08/2014   Procedure: PERCUTANEOUS STENT INTERVENTION;  Surgeon: Victory LELON Claudene DOUGLAS, MD;  Location: [facility] CATH LAB;  Service: Cardiovascular;;  MID LAD   POSTERIOR CERVICAL FUSION/FORAMINOTOMY  04/04/2011   Procedure: POSTERIOR CERVICAL FUSION/FORAMINOTOMY LEVEL 1;  Surgeon: Victory DELENA Gunnels, MD;  Location: [facility] NEURO ORS;  Service: Neurosurgery;  Laterality: Left;  Left Cervical Six-Seven Laminectomy, Foraminotomy, Diskectomy    TONSILLECTOMY     WISDOM TOOTH EXTRACTION     WRIST FRACTURE SURGERY Right 1990's    Current Medications: No outpatient medications have been marked as taking for the 01/16/24 encounter (Office Visit) with Santo Stanly DELENA, MD.     Allergies:   Ace inhibitors, Prilosec [omeprazole magnesium], Sulfa antibiotics, Tramadol hcl, Ultram [tramadol hcl], and Ozempic  (0.25 or 0.5 mg-dose) [semaglutide (0.25 or 0.5mg -dos)]   Social History   Socioeconomic History   Marital status: Widowed    Spouse name: Marinell   Number of children: 2   Years of education: some college   Highest education level: Associate degree: occupational, scientist, product/process development, or vocational program  Occupational History   Not on file  Tobacco Use   Smoking status: Never   Smokeless tobacco: Never  Vaping Use   Vaping status: Never Used  Substance and Sexual Activity    Alcohol use: No   Drug use: No   Sexual activity: Yes    Partners: Male    Birth control/protection: Post-menopausal  Other Topics Concern   Not on file  Social History Narrative   06/17/21   From: the area   Living: by herself currently, husband passed away 10/01/2021  Work: retired - [employer]      Family: 2 sons - [place] and [place] - 7 grandchildren      Enjoys: work at [employer], spend time with family, go to [employer], watch the grandchildren, reading, crafts      Exercise: walking based on weather, stationary bike 30 minutes daily   Diet: water diet - no fats/salt/carbs      Safety   Seat belts: Yes    Guns: No   Safe in relationships: Yes       Social Drivers of Corporate Investment Banker Strain: Low Risk  (09/02/2023)   Overall Financial  Resource Strain (CARDIA)    Difficulty of Paying Living Expenses: Not hard at all  Food Insecurity: No Food Insecurity (09/02/2023)   Hunger Vital Sign    Worried About Running Out of Food in the Last Year: Never true    Ran Out of Food in the Last Year: Never true  Transportation Needs: No Transportation Needs (09/02/2023)   PRAPARE - Administrator, Civil Service (Medical): No    Lack of Transportation (Non-Medical): No  Physical Activity: Sufficiently Active (09/02/2023)   Exercise Vital Sign    Days of Exercise per Week: 3 days    Minutes of Exercise per Session: 90 min  Stress: No Stress Concern Present (09/02/2023)   [employer] of Occupational Health - Occupational Stress Questionnaire    Feeling of Stress: Not at all  Social Connections: Moderately Integrated (09/02/2023)   Social Connection and Isolation Panel    Frequency of Communication with Friends and Family: More than three times a week    Frequency of Social Gatherings with Friends and Family: Three times a week    Attends Religious Services: More than 4 times per year    Active Member of Clubs or Organizations: Yes    Attends Banker  Meetings: More than 4 times per year    Marital Status: Widowed     Family History: The patient's family history includes Asthma in her sister; Diabetes type II in her father; Heart Problems in her sister; Heart disease in her father and mother; Hypertension in her brother, father, mother, sister, and sister; Parkinson's disease in her father and mother.  ROS:   Please see the history of present illness.     EKGs/Labs/Other Studies Reviewed:    The following studies were reviewed today: EKG WNL today  Cardiac Studies & Procedures   ______________________________________________________________________________________________ CARDIAC CATHETERIZATION  CARDIAC CATHETERIZATION 11/19/2019  Conclusion  Prox LAD to Mid LAD lesion is 25% stenosed.  Previously placed Mid LAD stent (unknown type) is widely patent.  Ost Cx to Prox Cx lesion is 30% stenosed.  1st Mrg lesion is 25% stenosed.  Mid Cx to Dist Cx lesion is 25% stenosed.  LV end diastolic pressure is normal.  1. Nonobstructive CAD. Prior stent in the LAD is patent 2. Normal LVEDP.  Plan: consider other causes for her symptoms. Continue risk factor modification.  Findings Coronary Findings Diagnostic  Dominance: Right  Left Main Vessel was injected. Vessel is normal in caliber. Vessel is angiographically normal.  Left Anterior Descending Prox LAD to Mid LAD lesion is 25% stenosed. Previously placed Mid LAD stent (unknown type) is widely patent.  Left Circumflex Ost Cx to Prox Cx lesion is 30% stenosed. Mid Cx to Dist Cx lesion is 25% stenosed.  First Obtuse Marginal Branch 1st Mrg lesion is 25% stenosed.  Right Coronary Artery Vessel was injected. Vessel is normal in caliber. The vessel exhibits minimal luminal irregularities.  Intervention  No interventions have been documented.   STRESS TESTS  MYOCARDIAL PERFUSION IMAGING 02/22/2017  Interpretation Summary  Nuclear stress EF: 65%.  Blood  pressure demonstrated a normal response to exercise.  No T wave inversion was noted during stress.  There was no ST segment deviation noted during stress.  Defect 1: There is a medium defect of moderate severity.  This is a low risk study.  Medium size, moderate intensity fixed septal perfusion defect, likely artifact. No reversible ischemia. LVEF 65% with normal wall motion. This is a low risk study.  ECHOCARDIOGRAM  ECHOCARDIOGRAM COMPLETE 11/18/2019  Narrative ECHOCARDIOGRAM REPORT    Patient Name:   DRISANA SCHWEICKERT Date of Exam: 11/18/2019 Medical Rec #:  [id number]     Height:       64.0 in Accession #:    [id number]    Weight:       170.0 lb Date of Birth:  [birth year]     BSA:          1.826 m Patient Age:    69 years      BP:           145/67 mmHg Patient Gender: F             HR:           63 bpm. Exam Location:  Inpatient  Procedure: 2D Echo, Cardiac Doppler and Color Doppler  Indications:    Chest pain  History:        Patient has no prior history of Echocardiogram examinations. Angina and CAD, Signs/Symptoms:Chest Pain; Risk Factors:Hypertension, Dyslipidemia and Diabetes.  Sonographer:    Rome Eans RDCS (AE) Referring Phys: [id number] CADENCE H FURTH   Sonographer Comments: Suboptimal subcostal window. IMPRESSIONS   1. Left ventricular ejection fraction, by estimation, is 60 to 65%. The left ventricle has normal function. The left ventricle has no regional wall motion abnormalities. Left ventricular diastolic parameters are consistent with Grade I diastolic dysfunction (impaired relaxation). 2. Right ventricular systolic function is normal. The right ventricular size is normal. 3. Left atrial size was mildly dilated. 4. The mitral valve is normal in structure. No evidence of mitral valve regurgitation. No evidence of mitral stenosis. 5. The aortic valve is normal in structure. Aortic valve regurgitation is not visualized. No aortic stenosis is present. 6.  The inferior vena cava is normal in size with greater than 50% respiratory variability, suggesting right atrial pressure of 3 mmHg.  FINDINGS Left Ventricle: Left ventricular ejection fraction, by estimation, is 60 to 65%. The left ventricle has normal function. The left ventricle has no regional wall motion abnormalities. The left ventricular internal cavity size was normal in size. There is no left ventricular hypertrophy. Left ventricular diastolic parameters are consistent with Grade I diastolic dysfunction (impaired relaxation).  Right Ventricle: The right ventricular size is normal. No increase in right ventricular wall thickness. Right ventricular systolic function is normal.  Left Atrium: Left atrial size was mildly dilated.  Right Atrium: Right atrial size was normal in size.  Pericardium: There is no evidence of pericardial effusion.  Mitral Valve: The mitral valve is normal in structure. No evidence of mitral valve regurgitation. No evidence of mitral valve stenosis. MV peak gradient, 3.9 mmHg. The mean mitral valve gradient is 1.0 mmHg.  Tricuspid Valve: The tricuspid valve is normal in structure. Tricuspid valve regurgitation is not demonstrated. No evidence of tricuspid stenosis.  Aortic Valve: The aortic valve is normal in structure. Aortic valve regurgitation is not visualized. No aortic stenosis is present. Aortic valve mean gradient measures 3.0 mmHg. Aortic valve peak gradient measures 5.4 mmHg. Aortic valve area, by VTI measures 2.21 cm.  Pulmonic Valve: The pulmonic valve was normal in structure. Pulmonic valve regurgitation is not visualized. No evidence of pulmonic stenosis.  Aorta: The aortic root is normal in size and structure.  Venous: The inferior vena cava is normal in size with greater than 50% respiratory variability, suggesting right atrial pressure of 3 mmHg.  IAS/Shunts: No atrial level shunt detected by color flow Doppler.  LEFT VENTRICLE PLAX  2D LVIDd:         3.60 cm  Diastology LVIDs:         2.30 cm  LV e' medial:    6.64 cm/s LV PW:         1.20 cm  LV E/e' medial:  11.0 LV IVS:        1.30 cm  LV e' lateral:   7.94 cm/s LVOT diam:     1.80 cm  LV E/e' lateral: 9.2 LV SV:         53 LV SV Index:   29 LVOT Area:     2.54 cm   RIGHT VENTRICLE RV Basal diam:  3.20 cm RV S prime:     10.40 cm/s TAPSE (M-mode): 2.4 cm  LEFT ATRIUM             Index       RIGHT ATRIUM           Index LA diam:        3.10 cm 1.70 cm/m  RA Area:     16.20 cm LA Vol (A2C):   32.3 ml 17.69 ml/m RA Volume:   40.40 ml  22.13 ml/m LA Vol (A4C):   53.8 ml 29.47 ml/m LA Biplane Vol: 41.8 ml 22.89 ml/m AORTIC VALVE AV Area (Vmax):    1.98 cm AV Area (Vmean):   1.99 cm AV Area (VTI):     2.21 cm AV Vmax:           116.00 cm/s AV Vmean:          81.300 cm/s AV VTI:            0.242 m AV Peak Grad:      5.4 mmHg AV Mean Grad:      3.0 mmHg LVOT Vmax:         90.10 cm/s LVOT Vmean:        63.700 cm/s LVOT VTI:          0.210 m LVOT/AV VTI ratio: 0.87  AORTA Ao Root diam: 3.10 cm Ao Asc diam:  2.80 cm  MITRAL VALVE MV Area (PHT): 3.53 cm    SHUNTS MV Peak grad:  3.9 mmHg    Systemic VTI:  0.21 m MV Mean grad:  1.0 mmHg    Systemic Diam: 1.80 cm MV Vmax:       0.99 m/s MV Vmean:      47.5 cm/s MV Decel Time: 215 msec MV E velocity: 72.80 cm/s MV A velocity: 91.30 cm/s MV E/A ratio:  0.80  Mihai Croitoru MD Electronically signed by Jerel Balding MD Signature Date/Time: 11/18/2019/7:34:04 PM    Final    MONITORS  LONG TERM MONITOR (3-14 DAYS) 02/08/2023  Narrative   Patient had a minimum heart rate of 54 bpm, maximum heart rate of 176 bpm, and average heart rate of 78 bpm. Rare short runs of SVT. Isolated PACs were rare (<1.0%). Isolated PVCs were rare (<1.0%). Triggered and diary events associated with sinus rhythm or PVCs.  Rare, occasionally symptomatic PVCs.        ______________________________________________________________________________________________      Recent Labs: 03/21/2023: TSH 2.75 09/03/2023: ALT 25; BUN 19; Creatinine, Ser 1.00; Hemoglobin 14.2; Platelets 207.0; Potassium 4.2; Sodium 141  Recent Lipid Panel    Component Value Date/Time   CHOL 122 03/21/2023 1001   TRIG 111.0 03/21/2023 1001   HDL 55.20 03/21/2023 1001   CHOLHDL 2 03/21/2023 1001  VLDL 22.2 03/21/2023 1001   LDLCALC 44 03/21/2023 1001        Physical Exam:    VS:  LMP 02/06/2010 Comment: bleeding 12-14-2018    Wt Readings from Last 3 Encounters:  09/03/23 155 lb 12.8 oz (70.7 kg)  05/16/23 163 lb 6.4 oz (74.1 kg)  05/10/23 169 lb 9.6 oz (76.9 kg)   GEN:  Well nourished, well developed in no acute distress HEENT: Normal NECK: No JVD CARDIAC: RRR, no murmurs, rubs, gallops RESPIRATORY:  Clear to auscultation without rales, wheezing or rhonchi  ABDOMEN: Soft, non-tender, non-distended MUSCULOSKELETAL:  No edema; No deformity  SKIN: Warm and dry NEUROLOGIC:  Alert and oriented x 3 PSYCHIATRIC:  Normal affect    ASSESSMENT/PLAN:    Coronary artery disease status post PCI with stent Coronary artery disease is well-managed with no significant chest pain. EKG is stable. No signs of congestive heart failure. Emphasis on maintaining coronary health to prevent further blockages. - Continue current management and medications. - Monitor for any new or worsening chest pain.  Premature ventricular contractions PVCs are well-controlled with no recent palpitations or symptoms. EKG shows no significant abnormalities today - Continue current management and medications.  Hypertension Blood pressure is slightly elevated but managed with current medications. No acute issues reported. - Continue current antihypertensive regimen.  Type 2 diabetes mellitus Diabetes management is ongoing with no acute issues reported.  Chronic kidney disease Monitored with  proteinuria. Farxiga is used to slow progression. No acute issues reported. - Continue Farxiga for kidney protection.  Sees Dr. Melia  Hyperlipidemia Managed with current medications. No acute issues reported.  SDM: one year f/u me or my team.  Stanly Leavens, MD FASE [facility] Cardiologist [facility]  [address] [place], [place] [address] [phone]  9:38 AM

## 2024-01-17 LAB — OPHTHALMOLOGY REPORT-SCANNED

## 2024-01-21 ENCOUNTER — Ambulatory Visit: Payer: Self-pay | Admitting: Family

## 2024-01-21 NOTE — Progress Notes (Signed)
 noted

## 2024-03-03 ENCOUNTER — Ambulatory Visit: Admitting: Family

## 2024-03-07 ENCOUNTER — Encounter: Payer: Self-pay | Admitting: *Deleted

## 2024-03-10 ENCOUNTER — Ambulatory Visit: Admitting: Family

## 2024-03-10 DIAGNOSIS — R197 Diarrhea, unspecified: Secondary | ICD-10-CM | POA: Diagnosis not present

## 2024-03-10 DIAGNOSIS — E782 Mixed hyperlipidemia: Secondary | ICD-10-CM

## 2024-03-10 DIAGNOSIS — E119 Type 2 diabetes mellitus without complications: Secondary | ICD-10-CM | POA: Insufficient documentation

## 2024-03-10 DIAGNOSIS — E538 Deficiency of other specified B group vitamins: Secondary | ICD-10-CM | POA: Insufficient documentation

## 2024-03-10 DIAGNOSIS — E039 Hypothyroidism, unspecified: Secondary | ICD-10-CM

## 2024-03-10 DIAGNOSIS — I10 Essential (primary) hypertension: Secondary | ICD-10-CM | POA: Diagnosis not present

## 2024-03-10 DIAGNOSIS — Z7985 Long-term (current) use of injectable non-insulin antidiabetic drugs: Secondary | ICD-10-CM

## 2024-03-10 DIAGNOSIS — E1169 Type 2 diabetes mellitus with other specified complication: Secondary | ICD-10-CM | POA: Diagnosis not present

## 2024-03-10 MED ORDER — OZEMPIC (0.25 OR 0.5 MG/DOSE) 2 MG/3ML ~~LOC~~ SOPN: 0.2500 mg | PEN_INJECTOR | SUBCUTANEOUS | Status: AC

## 2024-03-10 NOTE — Assessment & Plan Note (Signed)
 Tsh ordered pending results. Continue levothyroxine 88 mcg once daily

## 2024-03-10 NOTE — Assessment & Plan Note (Signed)
 Continue medications as prescribed.  Overall stable

## 2024-03-10 NOTE — Assessment & Plan Note (Signed)
 Continue ozempic  0.25 mg weekly and farxiga 10 mg once daily per nephrology Will continue at a slow pace, and continue at 0.25 mg for ozempic  as she tolerates Advised her to watch for abdominal pain, worsening constipation, n/v Ordering A1c and urine m/a pending results.

## 2024-03-14 ENCOUNTER — Encounter: Payer: Self-pay | Admitting: Podiatry

## 2024-03-14 NOTE — Progress Notes (Signed)
 Received refill request for nail fungus medication from [facility]. Patient last seen 08/29/2021. Denied refill and requested patient to contact the office.

## 2024-03-19 ENCOUNTER — Other Ambulatory Visit

## 2024-04-10 ENCOUNTER — Ambulatory Visit: Admitting: Podiatry
